# Patient Record
Sex: Female | Born: 1960
Health system: Southern US, Community
[De-identification: ages and names within clinical notes are randomized; demographics above are authoritative.]

## PROBLEM LIST (undated history)

## (undated) DIAGNOSIS — Z973 Presence of spectacles and contact lenses: Secondary | ICD-10-CM

## (undated) DIAGNOSIS — J189 Pneumonia, unspecified organism: Secondary | ICD-10-CM

## (undated) DIAGNOSIS — E785 Hyperlipidemia, unspecified: Secondary | ICD-10-CM

## (undated) DIAGNOSIS — I10 Essential (primary) hypertension: Secondary | ICD-10-CM

## (undated) DIAGNOSIS — R768 Other specified abnormal immunological findings in serum: Secondary | ICD-10-CM

## (undated) DIAGNOSIS — K219 Gastro-esophageal reflux disease without esophagitis: Secondary | ICD-10-CM

## (undated) DIAGNOSIS — M069 Rheumatoid arthritis, unspecified: Secondary | ICD-10-CM

## (undated) DIAGNOSIS — E669 Obesity, unspecified: Secondary | ICD-10-CM

## (undated) DIAGNOSIS — M1990 Unspecified osteoarthritis, unspecified site: Secondary | ICD-10-CM

## (undated) DIAGNOSIS — T7840XA Allergy, unspecified, initial encounter: Secondary | ICD-10-CM

## (undated) DIAGNOSIS — I493 Ventricular premature depolarization: Secondary | ICD-10-CM

## (undated) DIAGNOSIS — Z8632 Personal history of gestational diabetes: Secondary | ICD-10-CM

## (undated) DIAGNOSIS — K602 Anal fissure, unspecified: Secondary | ICD-10-CM

## (undated) HISTORY — PX: COLONOSCOPY: SHX174

## (undated) HISTORY — DX: Other specified abnormal immunological findings in serum: R76.8

## (undated) HISTORY — PX: ABDOMINAL HYSTERECTOMY: SHX81

## (undated) HISTORY — DX: Unspecified osteoarthritis, unspecified site: M19.90

## (undated) HISTORY — DX: Obesity, unspecified: E66.9

## (undated) HISTORY — DX: Anal fissure, unspecified: K60.2

## (undated) HISTORY — PX: PLANTAR'S WART EXCISION: SHX2240

## (undated) HISTORY — DX: Gastro-esophageal reflux disease without esophagitis: K21.9

## (undated) HISTORY — DX: Essential (primary) hypertension: I10

## (undated) HISTORY — DX: Hyperlipidemia, unspecified: E78.5

## (undated) HISTORY — DX: Ventricular premature depolarization: I49.3

## (undated) HISTORY — DX: Rheumatoid arthritis, unspecified: M06.9

## (undated) HISTORY — DX: Allergy, unspecified, initial encounter: T78.40XA

---
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
## 1998-02-06 ENCOUNTER — Inpatient Hospital Stay (HOSPITAL_COMMUNITY): Admission: AD | Admit: 1998-02-06 | Discharge: 1998-02-07 | Payer: Self-pay | Admitting: Obstetrics and Gynecology

## 1998-02-07 ENCOUNTER — Inpatient Hospital Stay (HOSPITAL_COMMUNITY): Admission: AD | Admit: 1998-02-07 | Discharge: 1998-02-10 | Payer: Self-pay | Admitting: Obstetrics and Gynecology

## 1999-03-28 ENCOUNTER — Other Ambulatory Visit: Admission: RE | Admit: 1999-03-28 | Discharge: 1999-03-28 | Payer: Self-pay | Admitting: Obstetrics and Gynecology

## 1999-06-21 ENCOUNTER — Encounter: Admission: RE | Admit: 1999-06-21 | Discharge: 1999-09-19 | Payer: Self-pay | Admitting: Gynecology

## 1999-12-29 ENCOUNTER — Inpatient Hospital Stay (HOSPITAL_COMMUNITY): Admission: AD | Admit: 1999-12-29 | Discharge: 2000-01-01 | Payer: Self-pay | Admitting: Gynecology

## 2000-02-09 ENCOUNTER — Other Ambulatory Visit: Admission: RE | Admit: 2000-02-09 | Discharge: 2000-02-09 | Payer: Self-pay | Admitting: Gynecology

## 2001-02-14 ENCOUNTER — Other Ambulatory Visit: Admission: RE | Admit: 2001-02-14 | Discharge: 2001-02-14 | Payer: Self-pay | Admitting: Obstetrics and Gynecology

## 2001-12-22 ENCOUNTER — Encounter: Payer: Self-pay | Admitting: Obstetrics and Gynecology

## 2001-12-22 ENCOUNTER — Ambulatory Visit (HOSPITAL_COMMUNITY): Admission: RE | Admit: 2001-12-22 | Discharge: 2001-12-22 | Payer: Self-pay | Admitting: Obstetrics and Gynecology

## 2002-01-28 ENCOUNTER — Other Ambulatory Visit: Admission: RE | Admit: 2002-01-28 | Discharge: 2002-01-28 | Payer: Self-pay | Admitting: Obstetrics and Gynecology

## 2003-02-05 ENCOUNTER — Other Ambulatory Visit: Admission: RE | Admit: 2003-02-05 | Discharge: 2003-02-05 | Payer: Self-pay | Admitting: Obstetrics and Gynecology

## 2003-09-06 ENCOUNTER — Encounter: Payer: Self-pay | Admitting: Obstetrics and Gynecology

## 2003-09-06 ENCOUNTER — Ambulatory Visit (HOSPITAL_COMMUNITY): Admission: RE | Admit: 2003-09-06 | Discharge: 2003-09-06 | Payer: Self-pay | Admitting: Obstetrics and Gynecology

## 2003-09-30 ENCOUNTER — Ambulatory Visit (HOSPITAL_COMMUNITY): Admission: RE | Admit: 2003-09-30 | Discharge: 2003-09-30 | Payer: Self-pay | Admitting: Internal Medicine

## 2003-09-30 ENCOUNTER — Encounter: Payer: Self-pay | Admitting: Internal Medicine

## 2004-02-25 ENCOUNTER — Other Ambulatory Visit: Admission: RE | Admit: 2004-02-25 | Discharge: 2004-02-25 | Payer: Self-pay | Admitting: Obstetrics and Gynecology

## 2004-09-11 ENCOUNTER — Ambulatory Visit (HOSPITAL_COMMUNITY): Admission: RE | Admit: 2004-09-11 | Discharge: 2004-09-11 | Payer: Self-pay | Admitting: Obstetrics and Gynecology

## 2004-12-24 DIAGNOSIS — R768 Other specified abnormal immunological findings in serum: Secondary | ICD-10-CM

## 2004-12-24 HISTORY — DX: Other specified abnormal immunological findings in serum: R76.8

## 2005-03-14 ENCOUNTER — Other Ambulatory Visit: Admission: RE | Admit: 2005-03-14 | Discharge: 2005-03-14 | Payer: Self-pay | Admitting: Obstetrics and Gynecology

## 2005-09-14 ENCOUNTER — Ambulatory Visit (HOSPITAL_COMMUNITY): Admission: RE | Admit: 2005-09-14 | Discharge: 2005-09-14 | Payer: Self-pay | Admitting: Obstetrics and Gynecology

## 2006-03-19 ENCOUNTER — Other Ambulatory Visit: Admission: RE | Admit: 2006-03-19 | Discharge: 2006-03-19 | Payer: Self-pay | Admitting: Obstetrics & Gynecology

## 2006-09-16 ENCOUNTER — Ambulatory Visit (HOSPITAL_COMMUNITY): Admission: RE | Admit: 2006-09-16 | Discharge: 2006-09-16 | Payer: Self-pay | Admitting: Obstetrics and Gynecology

## 2007-04-18 ENCOUNTER — Other Ambulatory Visit: Admission: RE | Admit: 2007-04-18 | Discharge: 2007-04-18 | Payer: Self-pay | Admitting: Obstetrics and Gynecology

## 2007-09-29 ENCOUNTER — Ambulatory Visit (HOSPITAL_COMMUNITY): Admission: RE | Admit: 2007-09-29 | Discharge: 2007-09-29 | Payer: Self-pay | Admitting: Obstetrics and Gynecology

## 2007-10-03 ENCOUNTER — Encounter: Admission: RE | Admit: 2007-10-03 | Discharge: 2007-10-03 | Payer: Self-pay | Admitting: Obstetrics and Gynecology

## 2008-01-21 ENCOUNTER — Emergency Department (HOSPITAL_COMMUNITY): Admission: EM | Admit: 2008-01-21 | Discharge: 2008-01-21 | Payer: Self-pay | Admitting: Family Medicine

## 2008-02-24 ENCOUNTER — Encounter: Admission: RE | Admit: 2008-02-24 | Discharge: 2008-03-09 | Payer: Self-pay | Admitting: Internal Medicine

## 2008-04-23 ENCOUNTER — Other Ambulatory Visit: Admission: RE | Admit: 2008-04-23 | Discharge: 2008-04-23 | Payer: Self-pay | Admitting: Obstetrics and Gynecology

## 2008-11-01 ENCOUNTER — Ambulatory Visit (HOSPITAL_COMMUNITY): Admission: RE | Admit: 2008-11-01 | Discharge: 2008-11-01 | Payer: Self-pay | Admitting: Obstetrics and Gynecology

## 2009-06-29 ENCOUNTER — Ambulatory Visit: Payer: Self-pay | Admitting: Obstetrics & Gynecology

## 2009-06-29 ENCOUNTER — Encounter: Payer: Self-pay | Admitting: Obstetrics & Gynecology

## 2009-06-29 LAB — CONVERTED CEMR LAB
AST: 17 units/L (ref 0–37)
Albumin: 4.2 g/dL (ref 3.5–5.2)
CO2: 23 meq/L (ref 19–32)
Calcium: 9.4 mg/dL (ref 8.4–10.5)
Chloride: 104 meq/L (ref 96–112)
Cholesterol: 186 mg/dL (ref 0–200)
HDL: 49 mg/dL (ref >39)
LDL Cholesterol: 112 mg/dL — ABNORMAL HIGH (ref 0–99)
MCV: 88.5 fL (ref 78.0–100.0)
Platelets: 251 10*3/uL (ref 150–400)
RBC: 4.8 M/uL (ref 3.87–5.11)
Sodium: 141 meq/L (ref 135–145)
TSH: 2.355 microintl units/mL (ref 0.350–4.500)
Total Bilirubin: 0.4 mg/dL (ref 0.3–1.2)
Triglycerides: 126 mg/dL (ref <150)

## 2009-11-21 ENCOUNTER — Ambulatory Visit (HOSPITAL_COMMUNITY): Admission: RE | Admit: 2009-11-21 | Discharge: 2009-11-21 | Payer: Self-pay | Admitting: Obstetrics and Gynecology

## 2010-10-13 ENCOUNTER — Other Ambulatory Visit: Admission: RE | Admit: 2010-10-13 | Discharge: 2010-10-13 | Payer: Self-pay | Admitting: Family Medicine

## 2010-11-28 ENCOUNTER — Encounter: Admission: RE | Admit: 2010-11-28 | Discharge: 2010-11-28 | Payer: Self-pay | Admitting: Family Medicine

## 2010-12-08 ENCOUNTER — Ambulatory Visit (HOSPITAL_COMMUNITY)
Admission: RE | Admit: 2010-12-08 | Discharge: 2010-12-08 | Payer: Self-pay | Source: Home / Self Care | Attending: Family Medicine | Admitting: Family Medicine

## 2011-01-14 ENCOUNTER — Encounter: Payer: Self-pay | Admitting: Obstetrics and Gynecology

## 2011-01-25 ENCOUNTER — Other Ambulatory Visit: Payer: Self-pay

## 2011-01-25 ENCOUNTER — Other Ambulatory Visit: Payer: Self-pay | Admitting: Pediatrics

## 2011-01-25 DIAGNOSIS — R55 Syncope and collapse: Secondary | ICD-10-CM

## 2011-05-11 NOTE — Op Note (Signed)
[facility]  Patient:    [name]                       MRN: [id number] Proc. Date: 12/29/99 Adm. Date:  [id number] Attending:  Merrily Pew                           Operative Report  PREOPERATIVE DIAGNOSIS:       Pregnancy at term.  History of prior cesarean section, desires repeat cesarean section.  Suspected macrosomia.  POSTOPERATIVE DIAGNOSIS:      Pregnancy at term.  History of prior cesarean section, desires repeat cesarean section.  Suspected macrosomia.  Leiomyomata.  OPERATION:                    Repeat low transverse cesarean section.  SURGEON:                      Timothy P. Fontaine, M.D.  ASSISTANTGaetano Hawthorne. Lily Peer, M.D.  ANESTHESIA:                   Spinal.  COMPLICATIONS:                None.  SPECIMEN:                     Samples of cord blood.  FINDINGS:                     At 10:56 a.m. normal female infant, weight 9 pounds 6 ounces, Apgars 8 and 9.  Posterior uterine subserosal leiomyomata approximately 2 to 3 cm.  Pelvic anatomy otherwise noted to be normal.  ESTIMATED BLOOD LOSS:         Less than 500 cc.  DESCRIPTION OF PROCEDURE:     The patient was taken to the operating room and underwent spinal anesthesia, was placed in the left tilt supine position, achieved an abdominal preparation with Betadine solution and a Foley catheter was placed in sterile technique.  The patient was then draped in the usual fashion.  Adequate  anesthesia was assured and the abdomen was sharply entered through a repeat Pfannenstiel incision achieving adequate hemostasis through all levels.  The bladder flap was then sharply and bluntly developed without difficulty.  The uterus was sharply entered in the lower uterine segment and bluntly laterally.  The membranes were ruptured, the fluid noted to be clear.  The infant delivered through this incision with the assistance of the vacuum extractor.   The nares and mouth  suctioned and the rest of the infant was delivered.  The cord doubly clamped and cut and the infant handed to the pediatrics in attendance.  Samples of cord blood were taken.  The placenta was then spontaneously extracted.  The uterus was exteriorized and the endometrial cavity explored with a sponge to remove all placental and membrane fragments.  The uterine incision was then closed in one layer using 0 Vicryl suture in a running interlocking stitch.  One small  bleeding area was addressed with interrupted 0 Vicryl suture.  The uterus was returned to the abdomen which was copiously irrigated and adequate hemostasis visualized.  The anterior fascia was then reapproximated using 0 Vicryl suture n a running stitch starting at  the angle and meeting in the middle.  The subcutaneous tissues were copiously irrigated and hemostasis was achieved with electrocautery. The skin was reapproximated with staples with intervening Steri-Strips after Benzoin application.  A sterile dressing was applied.  The patient was taken to the recovery room in good condition having tolerated the procedure well. DD:  12/29/99 TD:  12/30/99 Job: [id number]

## 2011-05-11 NOTE — Discharge Summary (Signed)
[facility]  Patient:    [name]                       MRN: [id number] Adm. Date:  [id number] Disc. Date: [id number] Attending:  Merrily Pew Dictator:   Gwendolyn Fill. Gatewood, P.A.                           Discharge Summary  DISCHARGE DIAGNOSES:          1. Intrauterine pregnancy at 39+ weeks, delivered.                               2. History of prior cesarean section.                               3. Suspected fetal macrosomia.                               4. Desired repeat cesarean section.                               5. Leiomyomata.                               6. Status post repeat low transverse cesarean section                                  by Timothy P. Fontaine, M.D. on December 29, 1999.  HISTORY OF PRESENT ILLNESS:   This 50 year old female, gravida 2, para 1, with n EDC of January 03, 2000.  Prenatal course has been complicated by history of prior cesarean section and also had history of PIH her previous pregnancy.  She did have an abnormal AFP and she declined amniocentesis initially until she was found to  have polyhydramnios at 23 weeks of gestation.  Revealed normal chromosomes on amniocentesis.  HOSPITAL COURSE:              On December 29, 1999, the patient was admitted at 39+ weeks with suspected fetal macrosomia and a history of prior cesarean section, desired repeat cesarean section.  On December 29, 1999, the patient underwent a repeat low transverse cesarean section by Nadyne Coombes. Fontaine, M.D. and at 1056  underwent delivery of a female, Apgars of 8 and 9, weight of 9 pounds 6 ounces. It was noted that there were posterior uterine subserosal leioma approximately 2 to 3 cm.  Postpartum, the patient remained afebrile, voiding, and in stable condition. She was discharged to home on January 01, 2000, and given [facility] Gynecology postpartum instructions as well as postpartum booklet.  ACCESSORY CLINICAL  DATA:      The patient is A positive, rubella immune.  On December 30, 1999, hemoglobin was 10.4, hematocrit 30.7.  DISPOSITION:                  The patient was discharged to home and informed to return to the office in six weeks and if she has any problems prior to that time to be seen in the office.  She was  given a prescription for Tylox p.r.n. pain.DD: 01/30/00 TD:  01/30/00 Job: [id number]

## 2011-11-20 ENCOUNTER — Other Ambulatory Visit (HOSPITAL_COMMUNITY)
Admission: RE | Admit: 2011-11-20 | Discharge: 2011-11-20 | Disposition: A | Discharge status: Home / Self Care | Payer: 59 | Source: Ambulatory Visit | Attending: Family Medicine | Admitting: Family Medicine

## 2011-11-20 ENCOUNTER — Other Ambulatory Visit: Payer: Self-pay | Admitting: Family Medicine

## 2011-11-20 DIAGNOSIS — Z124 Encounter for screening for malignant neoplasm of cervix: Secondary | ICD-10-CM | POA: Insufficient documentation

## 2011-12-06 ENCOUNTER — Other Ambulatory Visit (HOSPITAL_COMMUNITY): Payer: Self-pay | Admitting: Family Medicine

## 2011-12-06 DIAGNOSIS — Z1231 Encounter for screening mammogram for malignant neoplasm of breast: Secondary | ICD-10-CM

## 2012-01-01 ENCOUNTER — Encounter: Payer: Self-pay | Admitting: Internal Medicine

## 2012-01-10 ENCOUNTER — Ambulatory Visit (HOSPITAL_COMMUNITY)
Admission: RE | Admit: 2012-01-10 | Discharge: 2012-01-10 | Disposition: A | Discharge status: Home / Self Care | Payer: 59 | Source: Ambulatory Visit | Attending: Family Medicine | Admitting: Family Medicine

## 2012-01-10 DIAGNOSIS — Z1231 Encounter for screening mammogram for malignant neoplasm of breast: Secondary | ICD-10-CM | POA: Insufficient documentation

## 2012-01-22 ENCOUNTER — Encounter: Payer: Self-pay | Admitting: Internal Medicine

## 2012-01-22 ENCOUNTER — Ambulatory Visit (INDEPENDENT_AMBULATORY_CARE_PROVIDER_SITE_OTHER): Payer: 59 | Admitting: Internal Medicine

## 2012-01-22 VITALS — BP 134/78 | HR 64 | Ht 66.0 in | Wt 245.0 lb

## 2012-01-22 DIAGNOSIS — I10 Essential (primary) hypertension: Secondary | ICD-10-CM

## 2012-01-22 DIAGNOSIS — M069 Rheumatoid arthritis, unspecified: Secondary | ICD-10-CM | POA: Insufficient documentation

## 2012-01-22 DIAGNOSIS — K625 Hemorrhage of anus and rectum: Secondary | ICD-10-CM

## 2012-01-22 MED ORDER — PEG-KCL-NACL-NASULF-NA ASC-C 100 G PO SOLR: 1.0000 | Freq: Once | ORAL | Status: DC

## 2012-01-22 MED ORDER — HYDROCORTISONE ACETATE 25 MG RE SUPP: 25.0000 mg | Freq: Every day | RECTAL | Status: DC

## 2012-01-22 NOTE — Progress Notes (Addendum)
Subjective:    Patient ID: [name], female    DOB: 22-Sep-1961, 51 y.o.   MRN: [id number]  HPI This pleasant lady is describing some bright red blood with bowel movement (on) and with wiping. Also having itching in the anal area at times. ? Slight protrusion. She tried OTC agents (Preparation H and others. This did not help so she asked PCP for Proctofoam and that helped but now that she completed treatment she notices increasing rectal discomfort. Bleeding has been occurring over past few months. Has been on Meloxicam x 1 year (new dx of RA in past year). No other obvious triggers. Stools are formed and soft. Never has hard stools and is not straining. Does have an increasing frequency of stools at times. No Known Allergies  Current outpatient prescriptions:acetaminophen (TYLENOL) 325 MG tablet, Take 650 mg by mouth as needed., Disp: , Rfl: ;  calcium carbonate (TUMS - DOSED IN MG ELEMENTAL CALCIUM) 500 MG chewable tablet, Chew 1 tablet by mouth as needed., Disp: , Rfl: ;  Cholecalciferol (VITAMIN D) 2000 UNITS CAPS, Take 1 capsule by mouth daily., Disp: , Rfl:  esomeprazole (NEXIUM) 40 MG capsule, Take 40 mg by mouth as directed. Takes three times a week, Disp: , Rfl: ;  folic acid (FOLVITE) 1 MG tablet, Take 1 mg by mouth daily., Disp: , Rfl: ;  hydrochlorothiazide (HYDRODIURIL) 25 MG tablet, Take 25 mg by mouth daily., Disp: , Rfl: ;  meloxicam (MOBIC) 15 MG tablet, Take 15 mg by mouth. 1/2 tablet by mouth every other day, Disp: , Rfl:  Multiple Vitamin (MULTIVITAMIN) tablet, Take 1 tablet by mouth daily., Disp: , Rfl: ;  Pramox-PE-Glycerin-Petrolatum (PREPARATION H RE), Place rectally as needed., Disp: , Rfl: ;  shark liver oil-cocoa butter (PREPARATION H) 0.25-3-85.5 % suppository, Place 1 suppository rectally as needed., Disp: , Rfl:      Past Medical History  Diagnosis Date  . Arthritis   . Hypertension   . Asthma     as a child   . Hemorrhoids   . GERD (gastroesophageal reflux  disease)   . Helicobacter pylori antibody positive 2006     Tx w/ PPI, amoxicillin and Biaxin  . PVC's (premature ventricular contractions)    Past Surgical History  Procedure Date  . Cesarean section     x 2  . Plantar's wart excision    History   Social History  . Marital Status: Married    Spouse Name: N/A    Number of Children: 2  . Years of Education: N/A   Occupational History  . RN     Social History Main Topics  . Smoking status: Never Smoker   . Smokeless tobacco: Never Used  . Alcohol Use: No  . Drug Use: No  . Sexually Active: None   Other Topics Concern  . 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 Colon cancer Neg Hx   . Diabetes Brother     x 2         Review of Systems + joint pains, allergies, insomnia and hot flashes All other ROS negative    Objective:   Physical Exam General:  Well-developed, well-nourished and in no acute distress - obese Eyes:  anicteric. ENT:   Mouth and posterior pharynx free  of lesions.  Neck:   supple w/o thyromegaly or mass.  Lungs: Clear to auscultation bilaterally. Heart:  S1S2, no rubs, murmurs, gallops. Abdomen:  soft, non-tender, no hepatosplenomegaly, hernia, or mass and BS+.  Rectal:  female staff present - normal anoderm, mildly tender DRE, brown stool, no mass Lymph:  no cervical or supraclavicular adenopathy. Extremities:   no edema Neuro:  A&O x 3.  Psych:  appropriate mood and  Affect.   01/18/12 Progress note and lab review from rheumatologist. CBC was normal. Comprehensive metabolic panel normal. Sedimentation rate was 40.    Assessment & Plan:   1. Rectal bleeding    this sounds like it is probably from hemorrhoids. Have not proven that yet. Given her age, comorbidities and the symptoms and signs we have decided  to pursue a full colonoscopy. In the meantime she will use Anusol-HC suppositories for relief of her anorectal symptoms. Risks benefits and indications of colonoscopy explained she understands and agrees to proceed.

## 2012-01-22 NOTE — Patient Instructions (Addendum)
You have been scheduled for a Colonoscopy with separate instructions given. Your prep kit has been sent to your pharmacy for you to pick up. Your prescription(s) has(have) been sent to your pharmacy for you to pick up (Hydrocortisone Supp).

## 2012-02-15 ENCOUNTER — Other Ambulatory Visit: Payer: 59 | Admitting: Internal Medicine

## 2012-03-05 ENCOUNTER — Telehealth: Payer: Self-pay | Admitting: Internal Medicine

## 2012-03-05 NOTE — Telephone Encounter (Signed)
Returned pt's call.  Was work number/pt has left for the day.  Will try again in am.

## 2012-03-06 NOTE — Telephone Encounter (Signed)
Pt called to inquire about whether or not she should reschedule her colonoscopy if she is menstruating. Informed that for [place] she does not need to reschedule, to alert staff if she is menstruating the day of her procedure/TE

## 2012-03-10 ENCOUNTER — Telehealth: Payer: Self-pay | Admitting: Internal Medicine

## 2012-03-10 ENCOUNTER — Encounter: Payer: Self-pay | Admitting: Internal Medicine

## 2012-03-10 ENCOUNTER — Ambulatory Visit (AMBULATORY_SURGERY_CENTER): Payer: 59 | Admitting: Internal Medicine

## 2012-03-10 VITALS — BP 140/95 | HR 94 | Temp 97.9°F | Resp 18 | Ht 66.0 in | Wt 245.0 lb

## 2012-03-10 DIAGNOSIS — K625 Hemorrhage of anus and rectum: Secondary | ICD-10-CM

## 2012-03-10 DIAGNOSIS — K649 Unspecified hemorrhoids: Secondary | ICD-10-CM

## 2012-03-10 MED ORDER — SODIUM CHLORIDE 0.9 % IV SOLN: 500.0000 mL | INTRAVENOUS | Status: DC

## 2012-03-10 NOTE — Op Note (Addendum)
[facility] [address] [place], [place]  [address]  COLONOSCOPY PROCEDURE REPORT  PATIENT:  Beverly, Cherry  MR#:  [id number] BIRTHDATE:  [birth year], 50 yrs. old  GENDER:  female ENDOSCOPIST:  Iva Boop, MD, [facility]  PROCEDURE DATE:  03/10/2012 PROCEDURE:  Colonoscopy 27253 ASA CLASS:  Class II INDICATIONS:  rectal bleeding MEDICATIONS:   These medications were titrated to patient response per physician's verbal order, Fentanyl 75 mcg IV, Versed 10 mg IV  DESCRIPTION OF PROCEDURE:   After the risks benefits and alternatives of the procedure were thoroughly explained, informed consent was obtained.  Digital rectal exam was performed and revealed no abnormalities.   The LB [id number]-[id number] endoscope was introduced through the anus and advanced to the cecum, which was identified by both the appendix and ileocecal valve, without limitations.  The quality of the prep was excellent, using MoviPrep.  The instrument was then slowly withdrawn as the colon was fully examined. <<PROCEDUREIMAGES>>  FINDINGS:  A normal appearing cecum, ileocecal valve, and appendiceal orifice were identified. The ascending, hepatic flexure, transverse, splenic flexure, descending, sigmoid colon, and rectum appeared unremarkable.   Retroflexed views in the rectum revealed internal and external hemorrhoids.  Small.  The time to cecum = 3:31 minutes. The scope was then withdrawn in 8:35 minutes from the cecum and the procedure completed. COMPLICATIONS:  None ENDOSCOPIC IMPRESSION: 1) Normal colon 2) Internal and external hemorrhoids (small) - cause of bleeding  RECOMMENDATIONS: She has responded to hydrocortisone suppositories REPEAT EXAM:  In 10 year(s) for routine screening colonoscopy.  Iva Boop, MD, [facility]  CC:  The Patient and Carolin Coy, MD  n. REVISED:  03/10/2012 04:20 PM eSIGNED:   Iva Boop at 03/10/2012 04:20 PM  Raynald Blend, [phone]

## 2012-03-10 NOTE — Patient Instructions (Signed)
YOU HAD AN ENDOSCOPIC PROCEDURE TODAY AT THE [facility]: Refer to the procedure report that was given to you for any specific questions about what was found during the examination.  If the procedure report does not answer your questions, please call your gastroenterologist to clarify.  If you requested that your care partner not be given the details of your procedure findings, then the procedure report has been included in a sealed envelope for you to review at your convenience later.  YOU SHOULD EXPECT: Some feelings of bloating in the abdomen. Passage of more gas than usual.  Walking can help get rid of the air that was put into your GI tract during the procedure and reduce the bloating. If you had a lower endoscopy (such as a colonoscopy or flexible sigmoidoscopy) you may notice spotting of blood in your stool or on the toilet paper. If you underwent a bowel prep for your procedure, then you may not have a normal bowel movement for a few days.  DIET: Your first meal following the procedure should be a light meal and then it is ok to progress to your normal diet.  A half-sandwich or bowl of soup is an example of a good first meal.  Heavy or fried foods are harder to digest and may make you feel nauseous or bloated.  Likewise meals heavy in dairy and vegetables can cause extra gas to form and this can also increase the bloating.  Drink plenty of fluids but you should avoid alcoholic beverages for 24 hours.  ACTIVITY: Your care partner should take you home directly after the procedure.  You should plan to take it easy, moving slowly for the rest of the day.  You can resume normal activity the day after the procedure however you should NOT DRIVE or use heavy machinery for 24 hours (because of the sedation medicines used during the test).    SYMPTOMS TO REPORT IMMEDIATELY: A gastroenterologist can be reached at any hour.  During normal business hours, 8:30 AM to 5:00 PM Monday through Friday,  call [phone].  After hours and on weekends, please call the GI answering service at [phone] who will take a message and have the physician on call contact you.   Following lower endoscopy (colonoscopy or flexible sigmoidoscopy):  Excessive amounts of blood in the stool  Significant tenderness or worsening of abdominal pains  Swelling of the abdomen that is new, acute  Fever of 100F or higher    FOLLOW UP: If any biopsies were taken you will be contacted by phone or by letter within the next 1-3 weeks.  Call your gastroenterologist if you have not heard about the biopsies in 3 weeks.  Our staff will call the home number listed on your records the next business day following your procedure to check on you and address any questions or concerns that you may have at that time regarding the information given to you following your procedure. This is a courtesy call and so if there is no answer at the home number and we have not heard from you through the emergency physician on call, we will assume that you have returned to your regular daily activities without incident.  SIGNATURES/CONFIDENTIALITY: You and/or your care partner have signed paperwork which will be entered into your electronic medical record.  These signatures attest to the fact that that the information above on your After Visit Summary has been reviewed and is understood.  Full responsibility of the confidentiality   of this discharge information lies with you and/or your care-partner.     

## 2012-03-10 NOTE — Telephone Encounter (Signed)
Patient has had 1/2 liter moviprep this morning and now is vomiting. Had all moviprep liter last night. She states she passing yellow liquid no formed stool. Instructed patient to come in as directed.

## 2012-03-11 ENCOUNTER — Telehealth: Payer: Self-pay

## 2012-03-11 NOTE — Telephone Encounter (Signed)
  Follow up Call-  Call back number 03/10/2012  Post procedure Call Back phone  # [phone]  Permission to leave phone message Yes     Patient questions:  Do you have a fever, pain , or abdominal swelling? no Pain Score  0 *  Have you tolerated food without any problems? yes  Have you been able to return to your normal activities? yes  Do you have any questions about your discharge instructions: Diet   no Medications  no Follow up visit  no  Do you have questions or concerns about your Care? no  Actions: * If pain score is 4 or above: No action needed, pain <4.

## 2012-10-31 ENCOUNTER — Other Ambulatory Visit (HOSPITAL_COMMUNITY)
Admission: RE | Admit: 2012-10-31 | Discharge: 2012-10-31 | Disposition: A | Discharge status: Home / Self Care | Payer: 59 | Source: Ambulatory Visit | Attending: Family Medicine | Admitting: Family Medicine

## 2012-10-31 ENCOUNTER — Other Ambulatory Visit: Payer: Self-pay | Admitting: Family Medicine

## 2012-10-31 DIAGNOSIS — Z1151 Encounter for screening for human papillomavirus (HPV): Secondary | ICD-10-CM | POA: Insufficient documentation

## 2012-10-31 DIAGNOSIS — Z124 Encounter for screening for malignant neoplasm of cervix: Secondary | ICD-10-CM | POA: Insufficient documentation

## 2012-12-11 ENCOUNTER — Other Ambulatory Visit: Payer: Self-pay | Admitting: Obstetrics and Gynecology

## 2012-12-12 ENCOUNTER — Encounter (HOSPITAL_COMMUNITY): Payer: Self-pay | Admitting: Pharmacist

## 2012-12-18 ENCOUNTER — Encounter (HOSPITAL_COMMUNITY)
Admission: RE | Admit: 2012-12-18 | Discharge: 2012-12-18 | Disposition: A | Discharge status: Home / Self Care | Payer: 59 | Source: Ambulatory Visit | Attending: Obstetrics and Gynecology | Admitting: Obstetrics and Gynecology

## 2012-12-18 ENCOUNTER — Encounter (HOSPITAL_COMMUNITY): Payer: Self-pay

## 2012-12-18 ENCOUNTER — Other Ambulatory Visit: Payer: Self-pay

## 2012-12-18 LAB — BASIC METABOLIC PANEL
CO2: 30 mEq/L (ref 19–32)
Calcium: 9.3 mg/dL (ref 8.4–10.5)
Chloride: 103 mEq/L (ref 96–112)
Glucose, Bld: 95 mg/dL (ref 70–99)
Potassium: 4 mEq/L (ref 3.5–5.1)
Sodium: 139 mEq/L (ref 135–145)

## 2012-12-18 LAB — CBC
Hemoglobin: 12.8 g/dL (ref 12.0–15.0)
MCH: 29.4 pg (ref 26.0–34.0)
Platelets: 269 10*3/uL (ref 150–400)
RBC: 4.36 MIL/uL (ref 3.87–5.11)
WBC: 7.3 10*3/uL (ref 4.0–10.5)

## 2012-12-18 NOTE — Patient Instructions (Addendum)
20 DELLANIRA DILLOW  12/18/2012   Your procedure is scheduled on:  12/22/12  Enter through the Main Entrance of [facility] at 1 PM  Pick up the phone at the desk and dial [phone].   Call this number if you have problems the morning of surgery: [phone]   Remember:   Do not eat food:After Midnight.  Do not drink clear liquids: 4 Hours before arrival.  Take these medicines the morning of surgery with A SIP OF WATER: Blood pressure medication, GERD medication   Do not wear jewelry, make-up or nail polish.  Do not wear lotions, powders, or perfumes. You may wear deodorant.  Do not shave 48 hours prior to surgery.  Do not bring valuables to the hospital.  Contacts, dentures or bridgework may not be worn into surgery.  Leave suitcase in the car. After surgery it may be brought to your room.  For patients admitted to the hospital, checkout time is 11:00 AM the day of discharge.   Patients discharged the day of surgery will not be allowed to drive home.  Name and phone number of your driver: Husband  Special Instructions: Shower using CHG 2 nights before surgery and the night before surgery.  If you shower the day of surgery use CHG.  Use special wash - you have one bottle of CHG for all showers.  You should use approximately 1/3 of the bottle for each shower.   Please read over the following fact sheets that you were given: Surgical Site Infection Prevention

## 2012-12-22 ENCOUNTER — Ambulatory Visit (HOSPITAL_COMMUNITY): Payer: 59 | Admitting: Anesthesiology

## 2012-12-22 ENCOUNTER — Ambulatory Visit (HOSPITAL_COMMUNITY)
Admission: RE | Admit: 2012-12-22 | Discharge: 2012-12-22 | Disposition: A | Discharge status: Home / Self Care | Payer: 59 | Source: Ambulatory Visit | Attending: Obstetrics and Gynecology | Admitting: Obstetrics and Gynecology

## 2012-12-22 ENCOUNTER — Encounter (HOSPITAL_COMMUNITY): Payer: Self-pay | Admitting: Anesthesiology

## 2012-12-22 ENCOUNTER — Other Ambulatory Visit: Payer: Self-pay | Admitting: Obstetrics and Gynecology

## 2012-12-22 ENCOUNTER — Encounter (HOSPITAL_COMMUNITY)
Admission: RE | Disposition: A | Discharge status: Home / Self Care | Payer: Self-pay | Source: Ambulatory Visit | Attending: Obstetrics and Gynecology

## 2012-12-22 DIAGNOSIS — Z01812 Encounter for preprocedural laboratory examination: Secondary | ICD-10-CM | POA: Insufficient documentation

## 2012-12-22 DIAGNOSIS — N841 Polyp of cervix uteri: Secondary | ICD-10-CM | POA: Insufficient documentation

## 2012-12-22 DIAGNOSIS — Z8742 Personal history of other diseases of the female genital tract: Secondary | ICD-10-CM

## 2012-12-22 DIAGNOSIS — Z01818 Encounter for other preprocedural examination: Secondary | ICD-10-CM | POA: Insufficient documentation

## 2012-12-22 DIAGNOSIS — N920 Excessive and frequent menstruation with regular cycle: Secondary | ICD-10-CM | POA: Insufficient documentation

## 2012-12-22 HISTORY — PX: HYSTEROSCOPY WITH D & C: SHX1775

## 2012-12-22 HISTORY — PX: CERVICAL POLYPECTOMY: SHX88

## 2012-12-22 LAB — ABO/RH: ABO/RH(D): AB POS

## 2012-12-22 LAB — TYPE AND SCREEN
ABO/RH(D): AB POS
Antibody Screen: NEGATIVE

## 2012-12-22 SURGERY — DILATATION AND CURETTAGE /HYSTEROSCOPY
Anesthesia: General | Site: Uterus | Wound class: Clean Contaminated

## 2012-12-22 MED ORDER — LACTATED RINGERS IV SOLN
INTRAVENOUS | Status: DC
  Administered 2012-12-22 (×3): via INTRAVENOUS

## 2012-12-22 MED ORDER — LIDOCAINE HCL 2 % IJ SOLN
INTRAMUSCULAR | Status: AC
  Filled 2012-12-22: qty 20

## 2012-12-22 MED ORDER — LIDOCAINE HCL (CARDIAC) 20 MG/ML IV SOLN
INTRAVENOUS | Status: DC | PRN
  Administered 2012-12-22: 30 mg via INTRAVENOUS

## 2012-12-22 MED ORDER — DEXAMETHASONE SODIUM PHOSPHATE 4 MG/ML IJ SOLN
INTRAMUSCULAR | Status: DC | PRN
  Administered 2012-12-22: 10 mg via INTRAVENOUS

## 2012-12-22 MED ORDER — PROPOFOL 10 MG/ML IV EMUL
INTRAVENOUS | Status: DC | PRN
  Administered 2012-12-22: 200 mg via INTRAVENOUS

## 2012-12-22 MED ORDER — FENTANYL CITRATE 0.05 MG/ML IJ SOLN
INTRAMUSCULAR | Status: AC
  Filled 2012-12-22: qty 5

## 2012-12-22 MED ORDER — LIDOCAINE HCL 2 % IJ SOLN
INTRAMUSCULAR | Status: DC | PRN
  Administered 2012-12-22: 10 mL

## 2012-12-22 MED ORDER — GLYCOPYRROLATE 0.2 MG/ML IJ SOLN
INTRAMUSCULAR | Status: DC | PRN
  Administered 2012-12-22 (×2): 0.1 mg via INTRAVENOUS

## 2012-12-22 MED ORDER — FENTANYL CITRATE 0.05 MG/ML IJ SOLN
INTRAMUSCULAR | Status: DC | PRN
  Administered 2012-12-22 (×2): 100 ug via INTRAVENOUS
  Administered 2012-12-22: 50 ug via INTRAVENOUS

## 2012-12-22 MED ORDER — METOCLOPRAMIDE HCL 5 MG/ML IJ SOLN: 10.0000 mg | Freq: Once | INTRAMUSCULAR | Status: DC | PRN

## 2012-12-22 MED ORDER — GLYCINE 1.5 % IR SOLN
Status: DC | PRN
  Administered 2012-12-22: 3000 mL

## 2012-12-22 MED ORDER — MIDAZOLAM HCL 2 MG/2ML IJ SOLN
INTRAMUSCULAR | Status: AC
  Filled 2012-12-22: qty 2

## 2012-12-22 MED ORDER — SILVER NITRATE-POT NITRATE 75-25 % EX MISC
CUTANEOUS | Status: DC | PRN
  Administered 2012-12-22: 1 via TOPICAL

## 2012-12-22 MED ORDER — FENTANYL CITRATE 0.05 MG/ML IJ SOLN
INTRAMUSCULAR | Status: AC
  Administered 2012-12-22: 50 ug via INTRAVENOUS
  Filled 2012-12-22: qty 2

## 2012-12-22 MED ORDER — KETOROLAC TROMETHAMINE 30 MG/ML IJ SOLN
INTRAMUSCULAR | Status: DC | PRN
  Administered 2012-12-22: 30 mg via INTRAVENOUS

## 2012-12-22 MED ORDER — KETOROLAC TROMETHAMINE 30 MG/ML IJ SOLN
INTRAMUSCULAR | Status: AC
  Filled 2012-12-22: qty 1

## 2012-12-22 MED ORDER — CEFAZOLIN SODIUM-DEXTROSE 2-3 GM-% IV SOLR
INTRAVENOUS | Status: AC
  Filled 2012-12-22: qty 50

## 2012-12-22 MED ORDER — FENTANYL CITRATE 0.05 MG/ML IJ SOLN
25.0000 ug | INTRAMUSCULAR | Status: DC | PRN
  Administered 2012-12-22: 50 ug via INTRAVENOUS

## 2012-12-22 MED ORDER — MIDAZOLAM HCL 5 MG/5ML IJ SOLN
INTRAMUSCULAR | Status: DC | PRN
  Administered 2012-12-22: 2 mg via INTRAVENOUS

## 2012-12-22 MED ORDER — ONDANSETRON HCL 4 MG/2ML IJ SOLN
INTRAMUSCULAR | Status: DC | PRN
  Administered 2012-12-22: 4 mg via INTRAVENOUS

## 2012-12-22 MED ORDER — MEPERIDINE HCL 25 MG/ML IJ SOLN: 6.2500 mg | INTRAMUSCULAR | Status: DC | PRN

## 2012-12-22 MED ORDER — CEFAZOLIN SODIUM-DEXTROSE 2-3 GM-% IV SOLR
2.0000 g | INTRAVENOUS | Status: AC
  Administered 2012-12-22: 2 g via INTRAVENOUS

## 2012-12-22 MED ORDER — DEXAMETHASONE SODIUM PHOSPHATE 10 MG/ML IJ SOLN
INTRAMUSCULAR | Status: AC
  Filled 2012-12-22: qty 1

## 2012-12-22 SURGICAL SUPPLY — 16 items
CANISTER SUCTION 2500CC (MISCELLANEOUS) ×4 IMPLANT
CATH ROBINSON RED A/P 16FR (CATHETERS) ×4 IMPLANT
CLOTH BEACON ORANGE TIMEOUT ST (SAFETY) ×4 IMPLANT
CONTAINER PREFILL 10% NBF 60ML (FORM) ×8 IMPLANT
DILATOR CANAL MILEX (MISCELLANEOUS) IMPLANT
DRESSING TELFA 8X3 (GAUZE/BANDAGES/DRESSINGS) ×4 IMPLANT
GLOVE BIO SURGEON STRL SZ 6.5 (GLOVE) ×4 IMPLANT
GLOVE BIOGEL PI IND STRL 7.0 (GLOVE) ×6 IMPLANT
GLOVE BIOGEL PI INDICATOR 7.0 (GLOVE) ×2
GOWN STRL REIN XL XLG (GOWN DISPOSABLE) ×8 IMPLANT
LOOP ANGLED CUTTING 22FR (CUTTING LOOP) IMPLANT
PACK HYSTEROSCOPY LF (CUSTOM PROCEDURE TRAY) ×4 IMPLANT
PAD OB MATERNITY 4.3X12.25 (PERSONAL CARE ITEMS) ×4 IMPLANT
SUT VICRYL 0 ENDOLOOP (SUTURE) ×4 IMPLANT
TOWEL OR 17X24 6PK STRL BLUE (TOWEL DISPOSABLE) ×8 IMPLANT
WATER STERILE IRR 1000ML POUR (IV SOLUTION) ×4 IMPLANT

## 2012-12-22 NOTE — H&P (Signed)
12/10/2012  History of Present Illness  General:  51 y/o G2P2 presents for f/u ultrasound to assess a cervical polyp in preparation for surgery. Ultrasound done to confirm origin, i.e.- endometrial vs cervical. Pt also with increased spotting before and after menses that has increased length of menses. Ultrasound today shows a cervical polyp, 1.5 x 1.5 x 1.5 cm, + blood flow. EMS is 1.10 cm. Uterus o/w normal. Simple left ovarian cyst, 3.3 cm. Pt desires to proceed with cervical polypectomy and hyst/D&C. H/o PVCs~2 years ago. Cardiac w/u negative. Stress at work seemed to contribute. Has not had an episode in over a year. Saw PCP in November and exam was normal. ROS: normal BMs 2-3 times daily. Hemorrhoids from pregnancy. No family h/o bleeding disorders. Hemabate at time of c-section, prolonged labor.   Current Medications  Tums 500 MG Tablet Chewable 1 tablet as needed  Multivitamins Tablet 1 tablet everyday  Vitamin D 2000 UNIT Tablet 1 tablet with a meal Once a day  Tylenol 325 MG Tablet 2 tablet as needed  Clobetasol Propionate 0.05 % Cream 1 application to affected area Twice a day as needed  Folic Acid 1 MG Tablet TAKE 1 TABLET BY MOUTH ONCE DAILY   Zyrtec Allergy 10 MG Tablet 1 tablet Once a day  Methotrexate 2.5 MG Tablet 10 tabs once a week  Meloxicam 15 MG Tablet TAKE 1 TABLET BY MOUTH ONCE DAILY   Hydrochlorothiazide 25 MG Tablet take 1 tablet by mouth daily once a day  Nexium 40 MG Capsule Delayed Release TAKE 1 CAPSULE BY MOUTH ONCE DAILY AS DIRECTED   Medication List reviewed and reconciled with the patient   Past Medical History  Borderline blood pressure  Lipidemia  GERD  Mild anemia  Plantar fasciitis  vitamin D deficiency  Allergic rhinitis  history of PVC, holter in 2006 ok  Mild hearing loss, left ear hearing loss  Asthma as child no symptoms  parvo B19 viral polyarthralgia, 1997  Psoriasis  rheumatoid arthritis-low pos RF, CCP-, synovitis, non erosive,  also has psoriasis- Dr. Nickola Major   Surgical History  C-section x2 1999, 2001   Family History  Father: deceased 40 yrs Parkinson disease   Mother: alive 57 yrs RA, Osteoporosis   Brother 1: alive 46 yrs Diabetes, Hypertension   Brother2: alive 39 yrs Diabetes, Hypertension   Sister 1: alive 74 yrs Rheumatoid arthritis, Hypertension, depression, high cholesterol   Sister 2: alive 17 yrs rheumatoid arthritis, fibromyalgia,bipolar   Sister 3: alive 57 yrs rheumatoid Arthritis,   3 brother(s) , 5 sister(s) . 1 son(s) , 1 daughter(s) .   3 brothers and 5 sisters, other sister 71 yo high cholesterol, denies any GYN family cancer hx.   Social History  General:  History of smoking cigarettes: Never smoked.  no Smoking.  no Tobacco Exposure.  no Alcohol.  Caffeine: yes, coffee, 1cup/day.  no Recreational drug use.  no Diet.  Exercise: yes, nothing structured, walks.  Occupation: employed, Charity fundraiser, works for [employer].  Marital Status: married.  Children: Boys, 1, girls, 1.  Seat belt use: yes.    Gyn History  Sexual activity yes.  Periods : 15-40 days.  LMP 11/20/12.  Birth control vasectomy.  Last pap smear date 10/31/12, neg.  Last mammogram date 01/10/12.  Abnormal pap smear no.  Denies H/O STD .    OB History  Number of pregnancies 2.  Pregnancy # 1 live birth, C-section delivery.  Pregnancy # 2 live birth, C-section.  Allergies  N.K.D.A.   Hospitalization/Major Diagnostic Procedure  see surg hx    Vital Signs  Wt 227, Ht 67, BMI 35.55, Pulse sitting 83, BP sitting 142/80.   Physical Examination  GENERAL:  Patient appears in NAD, pleasant.  Build: well developed.  General Appearance: well-appearing, overweight.  Race: caucasian.  NECK:  ROM: normal.  Thyroid: no thyromegaly, non tender.  LUNGS:  Breath sounds: clear to auscultation.  Dyspnea: no.  HEART:  Murmurs: none.  Rate: normal.  Rhythm: regular.  ABDOMEN:  General: no  masses,tenderness,organomegaly, , BS normal, non distended.  EXTREMITIES:  Extremities no clubbing cyanosis or edema present.  NEUROLOGICAL:  gross motor and sensory grossly intact.  Orientation: alert and oriented x 3.  Previous pelvic exam on 12/05/13 with mobile 2-3 cm polypoid lesion at cervical os. Not friable, nontender. No abnormal discharge. Uterus normal size, nontender.   Assessments   1. Pre-op exam - V72.84 (Primary)   2. Endocervical polyp - 622.7   3. Prolonged menstruation - 626.2   Treatment  1. Pre-op exam  Proceed with endocervical polypectomy and hyst/d&C. Pt counseled on rationale of procedure, R/B/A, including risk of bleeding with subsequent hysterectomy if bleeding can not be controlled or hemorrhage. All questions answered. Consent obtained. Will need surgical clearance from PCP. Pre op appt with anesthesia due to h/o PVCs.    2. Endocervical polyp  Diagnostic Imaging:US ECHO TRANSVAGINAL    Procedure Codes  56213 ECHO TRANSVAGINAL  [id number] U S PELVIC   Follow Up  2 Weeks post op

## 2012-12-22 NOTE — Transfer of Care (Signed)
Immediate Anesthesia Transfer of Care Note  Patient: [name]  Procedure(s) Performed: Procedure(s) (LRB) with comments: DILATATION & CURETTAGE/HYSTEROSCOPY WITH RESECTOCOPE (N/A)  Patient Location: PACU  Anesthesia Type:General  Level of Consciousness: awake, alert  and oriented  Airway & Oxygen Therapy: Patient Spontanous Breathing and Patient connected to nasal cannula oxygen  Post-op Assessment: Report given to PACU RN and Post -op Vital signs reviewed and stable  Post vital signs: Reviewed and stable  Complications: No apparent anesthesia complications

## 2012-12-22 NOTE — H&P (View-Only) (Signed)
12/10/2012  History of Present Illness  General:  51 y/o G2P2 presents for f/u ultrasound to assess a cervical polyp in preparation for surgery. Ultrasound done to confirm origin, i.e.- endometrial vs cervical. Pt also with increased spotting before and after menses that has increased length of menses. Ultrasound today shows a cervical polyp, 1.5 x 1.5 x 1.5 cm, + blood flow. EMS is 1.10 cm. Uterus o/w normal. Simple left ovarian cyst, 3.3 cm. Pt desires to proceed with cervical polypectomy and hyst/D&C. H/o PVCs~2 years ago. Cardiac w/u negative. Stress at work seemed to contribute. Has not had an episode in over a year. Saw PCP in November and exam was normal. ROS: normal BMs 2-3 times daily. Hemorrhoids from pregnancy. No family h/o bleeding disorders. Hemabate at time of c-section, prolonged labor.   Current Medications  Tums 500 MG Tablet Chewable 1 tablet as needed  Multivitamins Tablet 1 tablet everyday  Vitamin D 2000 UNIT Tablet 1 tablet with a meal Once a day  Tylenol 325 MG Tablet 2 tablet as needed  Clobetasol Propionate 0.05 % Cream 1 application to affected area Twice a day as needed  Folic Acid 1 MG Tablet TAKE 1 TABLET BY MOUTH ONCE DAILY   Zyrtec Allergy 10 MG Tablet 1 tablet Once a day  Methotrexate 2.5 MG Tablet 10 tabs once a week  Meloxicam 15 MG Tablet TAKE 1 TABLET BY MOUTH ONCE DAILY   Hydrochlorothiazide 25 MG Tablet take 1 tablet by mouth daily once a day  Nexium 40 MG Capsule Delayed Release TAKE 1 CAPSULE BY MOUTH ONCE DAILY AS DIRECTED   Medication List reviewed and reconciled with the patient   Past Medical History  Borderline blood pressure  Lipidemia  GERD  Mild anemia  Plantar fasciitis  vitamin D deficiency  Allergic rhinitis  history of PVC, holter in 2006 ok  Mild hearing loss, left ear hearing loss  Asthma as child no symptoms  parvo B19 viral polyarthralgia, 1997  Psoriasis  rheumatoid arthritis-low pos RF, CCP-, synovitis, non erosive,  also has psoriasis- Dr. Hawkes   Surgical History  C-section x2 1999, 2001   Family History  Father: deceased 70 yrs Parkinson disease   Mother: alive 80 yrs RA, Osteoporosis   Brother 1: alive 46 yrs Diabetes, Hypertension   Brother2: alive 58 yrs Diabetes, Hypertension   Sister 1: alive 61 yrs Rheumatoid arthritis, Hypertension, depression, high cholesterol   Sister 2: alive 56 yrs rheumatoid arthritis, fibromyalgia,bipolar   Sister 3: alive 57 yrs rheumatoid Arthritis,   3 brother(s) , 5 sister(s) . 1 son(s) , 1 daughter(s) .   3 brothers and 5 sisters, other sister 49 yo high cholesterol, denies any GYN family cancer hx.   Social History  General:  History of smoking cigarettes: Never smoked.  no Smoking.  no Tobacco Exposure.  no Alcohol.  Caffeine: yes, coffee, 1cup/day.  no Recreational drug use.  no Diet.  Exercise: yes, nothing structured, walks.  Occupation: employed, RN, works for [employer].  Marital Status: married.  Children: Boys, 1, girls, 1.  Seat belt use: yes.    Gyn History  Sexual activity yes.  Periods : 15-40 days.  LMP 11/20/12.  Birth control vasectomy.  Last pap smear date 10/31/12, neg.  Last mammogram date 01/10/12.  Abnormal pap smear no.  Denies H/O STD .    OB History  Number of pregnancies 2.  Pregnancy # 1 live birth, C-section delivery.  Pregnancy # 2 live birth, C-section.      Allergies  N.K.D.A.   Hospitalization/Major Diagnostic Procedure  see surg hx    Vital Signs  Wt 227, Ht 67, BMI 35.55, Pulse sitting 83, BP sitting 142/80.   Physical Examination  GENERAL:  Patient appears in NAD, pleasant.  Build: well developed.  General Appearance: well-appearing, overweight.  Race: caucasian.  NECK:  ROM: normal.  Thyroid: no thyromegaly, non tender.  LUNGS:  Breath sounds: clear to auscultation.  Dyspnea: no.  HEART:  Murmurs: none.  Rate: normal.  Rhythm: regular.  ABDOMEN:  General: no  masses,tenderness,organomegaly, , BS normal, non distended.  EXTREMITIES:  Extremities no clubbing cyanosis or edema present.  NEUROLOGICAL:  gross motor and sensory grossly intact.  Orientation: alert and oriented x 3.  Previous pelvic exam on 12/05/13 with mobile 2-3 cm polypoid lesion at cervical os. Not friable, nontender. No abnormal discharge. Uterus normal size, nontender.   Assessments   1. Pre-op exam - V72.84 (Primary)   2. Endocervical polyp - 622.7   3. Prolonged menstruation - 626.2   Treatment  1. Pre-op exam  Proceed with endocervical polypectomy and hyst/d&C. Pt counseled on rationale of procedure, R/B/A, including risk of bleeding with subsequent hysterectomy if bleeding can not be controlled or hemorrhage. All questions answered. Consent obtained. Will need surgical clearance from PCP. Pre op appt with anesthesia due to h/o PVCs.    2. Endocervical polyp  Diagnostic Imaging:US ECHO TRANSVAGINAL    Procedure Codes  76830 ECHO TRANSVAGINAL  76856 U S PELVIC   Follow Up  2 Weeks post op    

## 2012-12-22 NOTE — Anesthesia Postprocedure Evaluation (Signed)
  Anesthesia Post-op Note  Patient: [name]  Procedure(s) Performed: Procedure(s) (LRB) with comments: DILATATION AND CURETTAGE /HYSTEROSCOPY () CERVICAL POLYPECTOMY (N/A) Patient is awake and responsive. Pain and nausea are reasonably well controlled. Vital signs are stable and clinically acceptable. Oxygen saturation is clinically acceptable. There are no apparent anesthetic complications at this time. Patient is ready for discharge.

## 2012-12-22 NOTE — Brief Op Note (Signed)
12/22/2012  3:55 PM  PATIENT:  [name]  51 y.o. female  PRE-OPERATIVE DIAGNOSIS:  Endocervical Polyp, Prolonged menses  POST-OPERATIVE DIAGNOSIS:  Same  PROCEDURE:  Procedure(s) (LRB) with comments: DILATATION & CURETTAGE/HYSTEROSCOPY WITH RESECTOCOPE (N/A)  SURGEON:  Surgeon(s) and Role:    * Geryl Rankins, MD - Primary  PHYSICIAN ASSISTANT:   ASSISTANTS: Technician   ANESTHESIA:   general  EBL:  Total I/O In: 1900 [I.V.:1900] Out: 20 [Blood:20]  BLOOD ADMINISTERED:none  DRAINS: none   LOCAL MEDICATIONS USED:  2% LIDOCAINE 10 cc  SPECIMEN:  Source of Specimen:  cervical polyp, endometrial currettings  DISPOSITION OF SPECIMEN:  PATHOLOGY  COUNTS:  YES  TOURNIQUET:  * No tourniquets in log *  DICTATION: .Other Dictation: Dictation Number [id number]  PLAN OF CARE: Discharge to home after PACU  PATIENT DISPOSITION:  PACU - hemodynamically stable.   Delay start of Pharmacological VTE agent (>24hrs) due to surgical blood loss or risk of bleeding: not applicable

## 2012-12-22 NOTE — Anesthesia Preprocedure Evaluation (Addendum)
Anesthesia Evaluation  Patient identified by MRN, date of birth, ID band Patient awake    Reviewed: Allergy & Precautions, H&P , NPO status , Patient's Chart, lab work & pertinent test results  Airway       Dental   Pulmonary asthma ,          Cardiovascular hypertension, Pt. on medications + dysrhythmias  PVC's    Neuro/Psych negative neurological ROS  negative psych ROS   GI/Hepatic Neg liver ROS, GERD-  Medicated and Controlled,  Endo/Other  negative endocrine ROS  Renal/GU negative Renal ROS  negative genitourinary   Musculoskeletal  (+) Arthritis -, Rheumatoid disorders,    Abdominal   Peds  Hematology negative hematology ROS (+)   Anesthesia Other Findings   Reproductive/Obstetrics Endocervical Polyp                          Anesthesia Physical Anesthesia Plan  ASA: II  Anesthesia Plan: General   Post-op Pain Management:    Induction: Intravenous  Airway Management Planned: LMA  Additional Equipment:   Intra-op Plan:   Post-operative Plan: Extubation in OR  Informed Consent: I have reviewed the patients History and Physical, chart, labs and discussed the procedure including the risks, benefits and alternatives for the proposed anesthesia with the patient or authorized representative who has indicated his/her understanding and acceptance.   Dental advisory given  Plan Discussed with:   Anesthesia Plan Comments:         Anesthesia Quick Evaluation

## 2012-12-22 NOTE — Interval H&P Note (Signed)
History and Physical Interval Note:  12/22/2012 2:21 PM  [name]  has presented today for surgery, with the diagnosis of Endocervical Polyp  The various methods of treatment have been discussed with the patient and family. After consideration of risks, benefits and other options for treatment, the patient has consented to  Procedure(s) (LRB) with comments: DILATATION & CURETTAGE/HYSTEROSCOPY WITH RESECTOCOPE (N/A) as a surgical intervention .  The patient's history has been reviewed, patient examined, no change in status, stable for surgery.  I have reviewed the patient's chart and labs.  Questions were answered to the patient's satisfaction.    Pt's LMP 12/16/12.  Normal flow then spotting.  [name], [name]

## 2012-12-23 NOTE — Op Note (Signed)
NAMEANTHONETTE, [name]               ACCOUNT NO.:  [id number]  MEDICAL RECORD NO.:  [id number]  LOCATION:  [facility]                          FACILITY:  [facility]  PHYSICIAN:  Pieter Partridge, MD   DATE OF BIRTH:  Nov 10, 1961  DATE OF PROCEDURE:  12/22/2012 DATE OF DISCHARGE:  12/22/2012                              OPERATIVE REPORT   PREOPERATIVE DIAGNOSIS:  Endocervical polyp and prolonged menses.  POSTOPERATIVE DIAGNOSIS:  Endocervical polyp and prolonged menses.  PROCEDURE:  Endocervical polypectomy and hysteroscopy D and C.  SURGEON:  Pieter Partridge, MD  ASSISTANT:  Technician.  EBL:  20 mL.  IV FLUIDS IN:  1900.  The patient voided prior to procedure.  ANESTHESIA:  Local 2% lidocaine, 10 mL.  SPECIMENS:  Cervical polyp and endometrial curettings to Pathology.  DISPOSITION:  To PACU hemodynamically stable.  COMPLICATIONS:  None.  FINDINGS:  A 2-cm cervical polyp with stalk high in the cervical canal. Normal uterus with atrophy near the fundus and more endometrium in the lower uterine segment.  Cervical canal appeared to have nabothian cyst as well.  INDICATION:  Initially presented upon referral from her primary doctor when an endocervical polyp was noted on a routine physical exam.  The patient denied post-coital bleeding; however, she had reported that she would have more spotting preceding her menses and afterwards, but it was continuous flow.  She denied intermenstrual bleeding.  Upon evaluation in the office, the polyp appeared to be large.  Ultrasound was done, and it did confirm that it was a cervical polyp, however, based on the size and need for exposure, the patient was counseled on resection in the operating room.  PROCEDURE IN DETAIL:  The patient was identified in the holding area. She was then taken to the operating room with IV running.  She underwent general endotracheal anesthesia without complications.  She was then placed in the dorsal lithotomy  position and prepped and draped in a normal sterile fashion.  The Graves speculum was then inserted into the vagina and the polyp was visualized.  For exposure, I changed to the weighted speculum and a narrow Deaver to be held by the assistant.  The cervical polyp was palpated and the stalk appeared to be maybe 2 cm into the endocervical canal.  The anterior lip of the cervix was initially grasped with an Allis, but then I changed to a single-tooth tenaculum for better exposure to potentially visualize the base of the stalk.  The polyp was then grasped with a polyp forceps and I placed 2 endocervical loops separately at the base of the stalk tightly.  The base of the polyp was then grasped with a Heaney clamp and transected with a curved Mayo scissors.  I was then able to visualize with a hysteroscope and that the knots were well below the Heaney clamp and the Heaney clamp was removed and the pedicle was hemostatic.  That pedicle was eventually trimmed.  I then placed a hysteroscope and advanced into the uterine fundus and the findings above were noted.  The endometrial cavity looked pretty clean.  No obvious abnormalities, no polyps or fibroids noted.  A curettage of all 4 quadrants of  the uterus was performed.  Upon removal of the hysteroscope, I was able to see that the stalk of the cervical polyp was in the posterior portion of the cervix, so I avoided curettage of the posterior portion coming out of the cervix.  Once the curettage was performed, the sutures were remained intact.  Upon completion of the procedure, I did trim the stalk a little bit more and one of the sutures was cut, but the 2nd suture remained and it was very hemostatic and there was just a small portion of the stalk that was visualized.  10 mL of 2% lidocaine was injected paracervically in the lower uterine segment.  Hemostasis was achieved with silver nitrate.  The cervical os was inspected for hemostasis.  No  active bleeding noted.  All instruments were removed from the vagina.  The patient tolerated the procedure well.  All instrument, sponge, and needle counts were correct x3.  She received Ancef 2 g IV prior to procedure.  She had SCDs on and operating during the case and she went to the recovery room in stable condition.     Pieter Partridge, MD     EBV/MEDQ  D:  12/22/2012  T:  12/23/2012  Job:  [id number]

## 2012-12-24 ENCOUNTER — Encounter (HOSPITAL_COMMUNITY): Payer: Self-pay | Admitting: Obstetrics and Gynecology

## 2013-01-05 ENCOUNTER — Other Ambulatory Visit (HOSPITAL_COMMUNITY): Payer: Self-pay | Admitting: Family Medicine

## 2013-01-05 DIAGNOSIS — Z1231 Encounter for screening mammogram for malignant neoplasm of breast: Secondary | ICD-10-CM

## 2013-01-19 ENCOUNTER — Ambulatory Visit (HOSPITAL_COMMUNITY)
Admission: RE | Admit: 2013-01-19 | Discharge: 2013-01-19 | Disposition: A | Discharge status: Home / Self Care | Payer: 59 | Source: Ambulatory Visit | Attending: Family Medicine | Admitting: Family Medicine

## 2013-01-19 DIAGNOSIS — Z1231 Encounter for screening mammogram for malignant neoplasm of breast: Secondary | ICD-10-CM | POA: Insufficient documentation

## 2013-02-07 ENCOUNTER — Other Ambulatory Visit: Payer: Self-pay

## 2013-04-13 ENCOUNTER — Other Ambulatory Visit: Payer: Self-pay | Admitting: Internal Medicine

## 2013-10-29 ENCOUNTER — Other Ambulatory Visit: Payer: Self-pay

## 2013-12-02 ENCOUNTER — Other Ambulatory Visit (HOSPITAL_COMMUNITY): Payer: Self-pay | Admitting: Family Medicine

## 2013-12-02 DIAGNOSIS — Z1231 Encounter for screening mammogram for malignant neoplasm of breast: Secondary | ICD-10-CM

## 2014-01-22 ENCOUNTER — Ambulatory Visit (HOSPITAL_COMMUNITY)
Admission: RE | Admit: 2014-01-22 | Discharge: 2014-01-22 | Disposition: A | Discharge status: Home / Self Care | Payer: 59 | Source: Ambulatory Visit | Attending: Family Medicine | Admitting: Family Medicine

## 2014-01-22 DIAGNOSIS — Z1231 Encounter for screening mammogram for malignant neoplasm of breast: Secondary | ICD-10-CM

## 2014-10-08 ENCOUNTER — Other Ambulatory Visit: Payer: Self-pay

## 2014-11-09 ENCOUNTER — Other Ambulatory Visit (HOSPITAL_COMMUNITY)
Admission: RE | Admit: 2014-11-09 | Discharge: 2014-11-09 | Disposition: A | Discharge status: Home / Self Care | Payer: 59 | Source: Ambulatory Visit | Attending: Family Medicine | Admitting: Family Medicine

## 2014-11-09 ENCOUNTER — Other Ambulatory Visit: Payer: Self-pay | Admitting: Family Medicine

## 2014-11-09 DIAGNOSIS — Z124 Encounter for screening for malignant neoplasm of cervix: Secondary | ICD-10-CM | POA: Diagnosis not present

## 2014-11-10 LAB — CYTOLOGY - PAP

## 2014-11-30 ENCOUNTER — Other Ambulatory Visit: Payer: Self-pay | Admitting: Family Medicine

## 2014-12-01 LAB — CYTOLOGY - PAP

## 2014-12-08 ENCOUNTER — Other Ambulatory Visit (HOSPITAL_COMMUNITY): Payer: Self-pay | Admitting: Family Medicine

## 2014-12-08 DIAGNOSIS — Z1231 Encounter for screening mammogram for malignant neoplasm of breast: Secondary | ICD-10-CM

## 2015-01-24 ENCOUNTER — Ambulatory Visit (HOSPITAL_COMMUNITY)
Admission: RE | Admit: 2015-01-24 | Discharge: 2015-01-24 | Disposition: A | Discharge status: Home / Self Care | Payer: 59 | Source: Ambulatory Visit | Attending: Family Medicine | Admitting: Family Medicine

## 2015-01-24 DIAGNOSIS — Z1231 Encounter for screening mammogram for malignant neoplasm of breast: Secondary | ICD-10-CM | POA: Diagnosis present

## 2016-01-06 DIAGNOSIS — D224 Melanocytic nevi of scalp and neck: Secondary | ICD-10-CM | POA: Diagnosis not present

## 2016-01-06 DIAGNOSIS — L821 Other seborrheic keratosis: Secondary | ICD-10-CM | POA: Diagnosis not present

## 2016-01-06 DIAGNOSIS — D2272 Melanocytic nevi of left lower limb, including hip: Secondary | ICD-10-CM | POA: Diagnosis not present

## 2016-01-06 DIAGNOSIS — L918 Other hypertrophic disorders of the skin: Secondary | ICD-10-CM | POA: Diagnosis not present

## 2016-01-06 DIAGNOSIS — D1801 Hemangioma of skin and subcutaneous tissue: Secondary | ICD-10-CM | POA: Diagnosis not present

## 2016-01-06 DIAGNOSIS — L718 Other rosacea: Secondary | ICD-10-CM | POA: Diagnosis not present

## 2016-01-06 DIAGNOSIS — L218 Other seborrheic dermatitis: Secondary | ICD-10-CM | POA: Diagnosis not present

## 2016-01-06 DIAGNOSIS — D2239 Melanocytic nevi of other parts of face: Secondary | ICD-10-CM | POA: Diagnosis not present

## 2016-01-06 DIAGNOSIS — D2262 Melanocytic nevi of left upper limb, including shoulder: Secondary | ICD-10-CM | POA: Diagnosis not present

## 2016-01-25 ENCOUNTER — Other Ambulatory Visit: Payer: Self-pay

## 2016-01-25 DIAGNOSIS — Z1231 Encounter for screening mammogram for malignant neoplasm of breast: Secondary | ICD-10-CM

## 2016-01-25 MED FILL — FOLIC ACID 1 MG TABLET: 1 | 90 days supply | Qty: 180 | Fill #0

## 2016-02-10 MED FILL — HYDROCHLOROTHIAZIDE 25 MG T: 25 | 90 days supply | Qty: 90 | Fill #2

## 2016-02-10 MED FILL — PANTOPRAZOLE SOD DR 40 MG T: 40 | 90 days supply | Qty: 90 | Fill #1

## 2016-02-10 MED FILL — METHOTREXATE 2.5 MG TABLET: 2.5 | 84 days supply | Qty: 108 | Fill #1

## 2016-02-14 ENCOUNTER — Ambulatory Visit
Admission: RE | Admit: 2016-02-14 | Discharge: 2016-02-14 | Disposition: A | Discharge status: Home / Self Care | Payer: 59 | Source: Ambulatory Visit

## 2016-02-14 DIAGNOSIS — Z1231 Encounter for screening mammogram for malignant neoplasm of breast: Secondary | ICD-10-CM | POA: Diagnosis not present

## 2016-02-21 DIAGNOSIS — Z79899 Other long term (current) drug therapy: Secondary | ICD-10-CM | POA: Diagnosis not present

## 2016-02-21 DIAGNOSIS — M0579 Rheumatoid arthritis with rheumatoid factor of multiple sites without organ or systems involvement: Secondary | ICD-10-CM | POA: Diagnosis not present

## 2016-02-21 DIAGNOSIS — M2550 Pain in unspecified joint: Secondary | ICD-10-CM | POA: Diagnosis not present

## 2016-02-21 MED FILL — MELOXICAM 15 MG TABLET: 15 | 90 days supply | Qty: 90 | Fill #0

## 2016-03-15 MED FILL — OSELTAMIVIR PHOS 75 MG CAP: 75 | 5 days supply | Qty: 10 | Fill #0

## 2016-03-23 DIAGNOSIS — R05 Cough: Secondary | ICD-10-CM | POA: Diagnosis not present

## 2016-03-23 MED FILL — levoFLOXacin 500 MG TABS: 500 | 7 days supply | Qty: 7 | Fill #0

## 2016-05-10 MED FILL — HYDROCHLOROTHIAZIDE 25 MG T: 25 | 90 days supply | Qty: 90 | Fill #3

## 2016-05-22 DIAGNOSIS — M25552 Pain in left hip: Secondary | ICD-10-CM | POA: Diagnosis not present

## 2016-05-22 DIAGNOSIS — M2550 Pain in unspecified joint: Secondary | ICD-10-CM | POA: Diagnosis not present

## 2016-05-22 DIAGNOSIS — Z79899 Other long term (current) drug therapy: Secondary | ICD-10-CM | POA: Diagnosis not present

## 2016-05-22 DIAGNOSIS — M25551 Pain in right hip: Secondary | ICD-10-CM | POA: Diagnosis not present

## 2016-05-22 DIAGNOSIS — M79641 Pain in right hand: Secondary | ICD-10-CM | POA: Diagnosis not present

## 2016-05-22 DIAGNOSIS — M0579 Rheumatoid arthritis with rheumatoid factor of multiple sites without organ or systems involvement: Secondary | ICD-10-CM | POA: Diagnosis not present

## 2016-05-22 DIAGNOSIS — M79642 Pain in left hand: Secondary | ICD-10-CM | POA: Diagnosis not present

## 2016-05-29 MED FILL — MELOXICAM 15 MG TABLET: 15 | 90 days supply | Qty: 90 | Fill #1

## 2016-05-29 MED FILL — FOLIC ACID 1 MG TABLET: 1 | 90 days supply | Qty: 180 | Fill #1

## 2016-06-11 MED FILL — METHOTREXATE 2.5 MG TABLET: 2.5 | 84 days supply | Qty: 108 | Fill #0

## 2016-08-08 MED FILL — HYDROCHLOROTHIAZIDE 25 MG T: 25 | 90 days supply | Qty: 90 | Fill #0

## 2016-08-14 DIAGNOSIS — L720 Epidermal cyst: Secondary | ICD-10-CM | POA: Diagnosis not present

## 2016-08-14 DIAGNOSIS — L821 Other seborrheic keratosis: Secondary | ICD-10-CM | POA: Diagnosis not present

## 2016-08-14 DIAGNOSIS — L918 Other hypertrophic disorders of the skin: Secondary | ICD-10-CM | POA: Diagnosis not present

## 2016-08-14 DIAGNOSIS — L820 Inflamed seborrheic keratosis: Secondary | ICD-10-CM | POA: Diagnosis not present

## 2016-08-21 DIAGNOSIS — M2550 Pain in unspecified joint: Secondary | ICD-10-CM | POA: Diagnosis not present

## 2016-08-21 DIAGNOSIS — M0579 Rheumatoid arthritis with rheumatoid factor of multiple sites without organ or systems involvement: Secondary | ICD-10-CM | POA: Diagnosis not present

## 2016-08-21 DIAGNOSIS — Z79899 Other long term (current) drug therapy: Secondary | ICD-10-CM | POA: Diagnosis not present

## 2016-08-31 MED FILL — MELOXICAM 15 MG TABLET: 15 | 90 days supply | Qty: 90 | Fill #0

## 2016-08-31 MED FILL — FOLIC ACID 1 MG TABLET: 1 | 90 days supply | Qty: 180 | Fill #0

## 2016-08-31 MED FILL — METHOTREXATE 2.5 MG TABLET: 2.5 | 84 days supply | Qty: 108 | Fill #0

## 2016-09-13 MED FILL — PANTOPRAZOLE SOD DR 40 MG T: 40 | 90 days supply | Qty: 90 | Fill #0

## 2016-10-16 ENCOUNTER — Other Ambulatory Visit: Payer: Self-pay | Admitting: Family Medicine

## 2016-10-16 ENCOUNTER — Other Ambulatory Visit (HOSPITAL_COMMUNITY)
Admission: RE | Admit: 2016-10-16 | Discharge: 2016-10-16 | Disposition: A | Discharge status: Home / Self Care | Payer: 59 | Source: Ambulatory Visit | Attending: Family Medicine | Admitting: Family Medicine

## 2016-10-16 DIAGNOSIS — Z1151 Encounter for screening for human papillomavirus (HPV): Secondary | ICD-10-CM | POA: Diagnosis not present

## 2016-10-16 DIAGNOSIS — E785 Hyperlipidemia, unspecified: Secondary | ICD-10-CM | POA: Diagnosis not present

## 2016-10-16 DIAGNOSIS — E559 Vitamin D deficiency, unspecified: Secondary | ICD-10-CM | POA: Diagnosis not present

## 2016-10-16 DIAGNOSIS — I10 Essential (primary) hypertension: Secondary | ICD-10-CM | POA: Diagnosis not present

## 2016-10-16 DIAGNOSIS — Z124 Encounter for screening for malignant neoplasm of cervix: Secondary | ICD-10-CM | POA: Diagnosis not present

## 2016-10-16 DIAGNOSIS — Z6839 Body mass index (BMI) 39.0-39.9, adult: Secondary | ICD-10-CM | POA: Diagnosis not present

## 2016-10-16 DIAGNOSIS — Z0000 Encounter for general adult medical examination without abnormal findings: Secondary | ICD-10-CM | POA: Diagnosis not present

## 2016-10-16 DIAGNOSIS — Z01411 Encounter for gynecological examination (general) (routine) with abnormal findings: Secondary | ICD-10-CM | POA: Insufficient documentation

## 2016-10-16 DIAGNOSIS — M069 Rheumatoid arthritis, unspecified: Secondary | ICD-10-CM | POA: Diagnosis not present

## 2016-10-16 MED FILL — FLUCONAZOLE 150 MG TABLET: 150 | 84 days supply | Qty: 12 | Fill #0

## 2016-10-19 LAB — CYTOLOGY - PAP
DIAGNOSIS: NEGATIVE
HPV (WINDOPATH): NOT DETECTED

## 2016-10-29 MED FILL — AZITHROMYCIN 250 MG TABLET: 250 | 5 days supply | Qty: 6 | Fill #0

## 2016-11-06 MED FILL — HYDROCHLOROTHIAZIDE 25 MG T: 25 | 90 days supply | Qty: 90 | Fill #1

## 2016-11-29 DIAGNOSIS — H524 Presbyopia: Secondary | ICD-10-CM | POA: Diagnosis not present

## 2016-11-29 DIAGNOSIS — H5213 Myopia, bilateral: Secondary | ICD-10-CM | POA: Diagnosis not present

## 2016-11-29 DIAGNOSIS — H52203 Unspecified astigmatism, bilateral: Secondary | ICD-10-CM | POA: Diagnosis not present

## 2016-12-03 MED FILL — MELOXICAM 15 MG TABLET: 15 | 90 days supply | Qty: 90 | Fill #0

## 2016-12-03 MED FILL — FOLIC ACID 1 MG TABLET: 1 | 90 days supply | Qty: 180 | Fill #0

## 2016-12-06 DIAGNOSIS — Z79899 Other long term (current) drug therapy: Secondary | ICD-10-CM | POA: Diagnosis not present

## 2016-12-06 DIAGNOSIS — E669 Obesity, unspecified: Secondary | ICD-10-CM | POA: Diagnosis not present

## 2016-12-06 DIAGNOSIS — M0579 Rheumatoid arthritis with rheumatoid factor of multiple sites without organ or systems involvement: Secondary | ICD-10-CM | POA: Diagnosis not present

## 2016-12-06 DIAGNOSIS — M2550 Pain in unspecified joint: Secondary | ICD-10-CM | POA: Diagnosis not present

## 2016-12-06 DIAGNOSIS — Z6839 Body mass index (BMI) 39.0-39.9, adult: Secondary | ICD-10-CM | POA: Diagnosis not present

## 2016-12-31 MED FILL — METHOTREXATE 2.5 MG TABLET: 2.5 | 84 days supply | Qty: 108 | Fill #0

## 2017-01-15 ENCOUNTER — Other Ambulatory Visit: Payer: Self-pay | Admitting: Family Medicine

## 2017-01-15 DIAGNOSIS — Z1231 Encounter for screening mammogram for malignant neoplasm of breast: Secondary | ICD-10-CM

## 2017-02-04 MED FILL — HYDROCHLOROTHIAZIDE 25 MG T: 25 | 90 days supply | Qty: 90 | Fill #2

## 2017-02-15 ENCOUNTER — Ambulatory Visit
Admission: RE | Admit: 2017-02-15 | Discharge: 2017-02-15 | Disposition: A | Discharge status: Home / Self Care | Payer: 59 | Source: Ambulatory Visit | Attending: Family Medicine | Admitting: Family Medicine

## 2017-02-15 ENCOUNTER — Encounter: Payer: Self-pay | Admitting: Radiology

## 2017-02-15 DIAGNOSIS — Z1231 Encounter for screening mammogram for malignant neoplasm of breast: Secondary | ICD-10-CM

## 2017-03-12 DIAGNOSIS — M2550 Pain in unspecified joint: Secondary | ICD-10-CM | POA: Diagnosis not present

## 2017-03-12 DIAGNOSIS — M0579 Rheumatoid arthritis with rheumatoid factor of multiple sites without organ or systems involvement: Secondary | ICD-10-CM | POA: Diagnosis not present

## 2017-03-12 DIAGNOSIS — J988 Other specified respiratory disorders: Secondary | ICD-10-CM | POA: Diagnosis not present

## 2017-03-12 DIAGNOSIS — Z79899 Other long term (current) drug therapy: Secondary | ICD-10-CM | POA: Diagnosis not present

## 2017-03-12 DIAGNOSIS — Z6841 Body Mass Index (BMI) 40.0 and over, adult: Secondary | ICD-10-CM | POA: Diagnosis not present

## 2017-03-13 MED FILL — FOLIC ACID 1 MG TABLET: 1 | 90 days supply | Qty: 180 | Fill #0

## 2017-03-13 MED FILL — MELOXICAM 15 MG TABLET: 15 | 90 days supply | Qty: 90 | Fill #0

## 2017-04-01 MED FILL — METHOTREXATE 2.5 MG TABLET: 2.5 | 84 days supply | Qty: 108 | Fill #0

## 2017-04-24 MED FILL — PANTOPRAZOLE SOD DR 40 MG T: 40 | 90 days supply | Qty: 90 | Fill #1

## 2017-05-07 MED FILL — HYDROCHLOROTHIAZIDE 25 MG T: 25 | 90 days supply | Qty: 90 | Fill #3

## 2017-05-22 DIAGNOSIS — D2239 Melanocytic nevi of other parts of face: Secondary | ICD-10-CM | POA: Diagnosis not present

## 2017-05-22 DIAGNOSIS — D1801 Hemangioma of skin and subcutaneous tissue: Secondary | ICD-10-CM | POA: Diagnosis not present

## 2017-05-22 DIAGNOSIS — L821 Other seborrheic keratosis: Secondary | ICD-10-CM | POA: Diagnosis not present

## 2017-05-22 DIAGNOSIS — L814 Other melanin hyperpigmentation: Secondary | ICD-10-CM | POA: Diagnosis not present

## 2017-05-22 DIAGNOSIS — D2372 Other benign neoplasm of skin of left lower limb, including hip: Secondary | ICD-10-CM | POA: Diagnosis not present

## 2017-05-22 DIAGNOSIS — L298 Other pruritus: Secondary | ICD-10-CM | POA: Diagnosis not present

## 2017-05-22 DIAGNOSIS — D225 Melanocytic nevi of trunk: Secondary | ICD-10-CM | POA: Diagnosis not present

## 2017-05-22 DIAGNOSIS — L918 Other hypertrophic disorders of the skin: Secondary | ICD-10-CM | POA: Diagnosis not present

## 2017-05-22 DIAGNOSIS — I788 Other diseases of capillaries: Secondary | ICD-10-CM | POA: Diagnosis not present

## 2017-06-11 DIAGNOSIS — Z6841 Body Mass Index (BMI) 40.0 and over, adult: Secondary | ICD-10-CM | POA: Diagnosis not present

## 2017-06-11 DIAGNOSIS — Z79899 Other long term (current) drug therapy: Secondary | ICD-10-CM | POA: Diagnosis not present

## 2017-06-11 DIAGNOSIS — M0579 Rheumatoid arthritis with rheumatoid factor of multiple sites without organ or systems involvement: Secondary | ICD-10-CM | POA: Diagnosis not present

## 2017-06-11 DIAGNOSIS — M2550 Pain in unspecified joint: Secondary | ICD-10-CM | POA: Diagnosis not present

## 2017-06-17 MED FILL — MELOXICAM 15 MG TABLET: 15 | 90 days supply | Qty: 90 | Fill #0

## 2017-06-17 MED FILL — FOLIC ACID 1 MG TABLET: 1 | 90 days supply | Qty: 180 | Fill #0

## 2017-07-15 MED FILL — METHOTREXATE 2.5 MG TABLET: 2.5 | 84 days supply | Qty: 108 | Fill #0

## 2017-08-06 MED FILL — HYDROCHLOROTHIAZIDE 25 MG T: 25 | 90 days supply | Qty: 90 | Fill #0

## 2017-09-17 DIAGNOSIS — E669 Obesity, unspecified: Secondary | ICD-10-CM | POA: Diagnosis not present

## 2017-09-17 DIAGNOSIS — Z79899 Other long term (current) drug therapy: Secondary | ICD-10-CM | POA: Diagnosis not present

## 2017-09-17 DIAGNOSIS — M2550 Pain in unspecified joint: Secondary | ICD-10-CM | POA: Diagnosis not present

## 2017-09-17 DIAGNOSIS — M0579 Rheumatoid arthritis with rheumatoid factor of multiple sites without organ or systems involvement: Secondary | ICD-10-CM | POA: Diagnosis not present

## 2017-09-17 DIAGNOSIS — Z6839 Body mass index (BMI) 39.0-39.9, adult: Secondary | ICD-10-CM | POA: Diagnosis not present

## 2017-09-17 MED FILL — FOLIC ACID 1 MG TABLET: 1 | 90 days supply | Qty: 180 | Fill #0

## 2017-09-17 MED FILL — MELOXICAM 15 MG TABLET: 15 | 90 days supply | Qty: 90 | Fill #0

## 2017-10-17 MED FILL — METHOTREXATE 2.5 MG TABLET: 2.5 | 84 days supply | Qty: 108 | Fill #1

## 2017-10-22 DIAGNOSIS — I10 Essential (primary) hypertension: Secondary | ICD-10-CM | POA: Diagnosis not present

## 2017-10-22 DIAGNOSIS — L409 Psoriasis, unspecified: Secondary | ICD-10-CM | POA: Diagnosis not present

## 2017-10-22 DIAGNOSIS — J45909 Unspecified asthma, uncomplicated: Secondary | ICD-10-CM | POA: Diagnosis not present

## 2017-10-22 DIAGNOSIS — M069 Rheumatoid arthritis, unspecified: Secondary | ICD-10-CM | POA: Diagnosis not present

## 2017-10-22 DIAGNOSIS — E785 Hyperlipidemia, unspecified: Secondary | ICD-10-CM | POA: Diagnosis not present

## 2017-10-22 DIAGNOSIS — Z0000 Encounter for general adult medical examination without abnormal findings: Secondary | ICD-10-CM | POA: Diagnosis not present

## 2017-10-22 DIAGNOSIS — E559 Vitamin D deficiency, unspecified: Secondary | ICD-10-CM | POA: Diagnosis not present

## 2017-11-06 MED FILL — PANTOPRAZOLE SOD DR 40 MG T: 40 | 90 days supply | Qty: 90 | Fill #0

## 2017-11-06 MED FILL — HYDROCHLOROTHIAZIDE 25 MG T: 25 | 90 days supply | Qty: 90 | Fill #1

## 2017-12-10 DIAGNOSIS — Z6838 Body mass index (BMI) 38.0-38.9, adult: Secondary | ICD-10-CM | POA: Diagnosis not present

## 2017-12-10 DIAGNOSIS — E669 Obesity, unspecified: Secondary | ICD-10-CM | POA: Diagnosis not present

## 2017-12-10 DIAGNOSIS — Z79899 Other long term (current) drug therapy: Secondary | ICD-10-CM | POA: Diagnosis not present

## 2017-12-10 DIAGNOSIS — M0579 Rheumatoid arthritis with rheumatoid factor of multiple sites without organ or systems involvement: Secondary | ICD-10-CM | POA: Diagnosis not present

## 2017-12-10 DIAGNOSIS — M2550 Pain in unspecified joint: Secondary | ICD-10-CM | POA: Diagnosis not present

## 2017-12-13 DIAGNOSIS — H524 Presbyopia: Secondary | ICD-10-CM | POA: Diagnosis not present

## 2017-12-13 DIAGNOSIS — H5213 Myopia, bilateral: Secondary | ICD-10-CM | POA: Diagnosis not present

## 2017-12-13 DIAGNOSIS — H52203 Unspecified astigmatism, bilateral: Secondary | ICD-10-CM | POA: Diagnosis not present

## 2017-12-19 MED FILL — FOLIC ACID 1 MG TABLET: 1 | 90 days supply | Qty: 180 | Fill #0

## 2017-12-19 MED FILL — MELOXICAM 15 MG TABLET: 15 | 90 days supply | Qty: 90 | Fill #0

## 2018-01-07 MED FILL — METHOTREXATE 2.5 MG TABLET: 2.5 | 84 days supply | Qty: 108 | Fill #0

## 2018-01-17 ENCOUNTER — Other Ambulatory Visit: Payer: Self-pay | Admitting: Family Medicine

## 2018-01-17 DIAGNOSIS — Z1231 Encounter for screening mammogram for malignant neoplasm of breast: Secondary | ICD-10-CM

## 2018-02-05 MED FILL — HYDROCHLOROTHIAZIDE 25 MG T: 25 | 90 days supply | Qty: 90 | Fill #2

## 2018-02-18 ENCOUNTER — Ambulatory Visit
Admission: RE | Admit: 2018-02-18 | Discharge: 2018-02-18 | Disposition: A | Discharge status: Home / Self Care | Payer: 59 | Source: Ambulatory Visit | Attending: Family Medicine | Admitting: Family Medicine

## 2018-02-18 ENCOUNTER — Ambulatory Visit: Payer: 59

## 2018-02-18 DIAGNOSIS — Z1231 Encounter for screening mammogram for malignant neoplasm of breast: Secondary | ICD-10-CM | POA: Diagnosis not present

## 2018-03-11 DIAGNOSIS — Z6839 Body mass index (BMI) 39.0-39.9, adult: Secondary | ICD-10-CM | POA: Diagnosis not present

## 2018-03-11 DIAGNOSIS — Z79899 Other long term (current) drug therapy: Secondary | ICD-10-CM | POA: Diagnosis not present

## 2018-03-11 DIAGNOSIS — E669 Obesity, unspecified: Secondary | ICD-10-CM | POA: Diagnosis not present

## 2018-03-11 DIAGNOSIS — M0579 Rheumatoid arthritis with rheumatoid factor of multiple sites without organ or systems involvement: Secondary | ICD-10-CM | POA: Diagnosis not present

## 2018-03-11 DIAGNOSIS — M2550 Pain in unspecified joint: Secondary | ICD-10-CM | POA: Diagnosis not present

## 2018-03-21 MED FILL — MELOXICAM 15 MG TABLET: 15 | 90 days supply | Qty: 90 | Fill #1

## 2018-03-25 DIAGNOSIS — M0579 Rheumatoid arthritis with rheumatoid factor of multiple sites without organ or systems involvement: Secondary | ICD-10-CM | POA: Diagnosis not present

## 2018-03-31 MED FILL — METHOTREXATE SODIUM 2.5 MG: 2.5 | 84 days supply | Qty: 108 | Fill #1

## 2018-03-31 MED FILL — FOLIC ACID 1 MG TABLET: 1 | 90 days supply | Qty: 180 | Fill #1

## 2018-04-14 ENCOUNTER — Telehealth: Payer: Self-pay | Admitting: Pharmacist

## 2018-04-14 NOTE — Telephone Encounter (Signed)
Called patient to schedule an appointment for the [facility]. I was unable to reach the patient and VM was full so secure email sent to employee email address.

## 2018-04-17 ENCOUNTER — Ambulatory Visit (INDEPENDENT_AMBULATORY_CARE_PROVIDER_SITE_OTHER): Payer: 59 | Admitting: Pharmacist

## 2018-04-17 ENCOUNTER — Encounter: Payer: Self-pay | Admitting: Pharmacist

## 2018-04-17 DIAGNOSIS — Z79899 Other long term (current) drug therapy: Secondary | ICD-10-CM

## 2018-04-17 MED ORDER — ADALIMUMAB 40 MG/0.4ML ~~LOC~~ AJKT: 1.0000 "pen " | AUTO-INJECTOR | SUBCUTANEOUS | 6 refills | Status: DC

## 2018-04-17 MED FILL — HUMIRA PEN 40 MG/0.4ML PNKT: 40 | 28 days supply | Qty: 2 | Fill #0

## 2018-04-17 NOTE — Progress Notes (Signed)
   S: Patient presents to [facility] for review of their specialty medication therapy.  Patient is currently taking Humira for rheumatoid arthritis. Patient is managed by Dr. Trudie Reed for this. She has been on methotrexate for the last 7 years. Plans to continue on the methotrexate for now.   Adherence: she has not started it yet. She plans to start as soon as she gets the medication from the pharmacy. She has a Sports administrator who will help her with the first injection.   Efficacy: she has not started it yet.   Dosing: Rheumatoid arthritis: SubQ: 40 mg every other week (may continue methotrexate, other nonbiologic DMARDS, corticosteroids, NSAIDs, and/or analgesics); patients not taking concomitant methotrexate may increase dose to 40 mg every week   Dose adjustments: Renal: no dose adjustments (has not been studied) Hepatic: no dose adjustments (has not been studied)  Screening: TB test: completed and was negative per patient Hepatitis: completed per patient  Monitoring: S/sx of infection: denies CBC: monitored q3 months as she has been on methotrexate S/sx of hypersensitivity: has not started S/sx of malignancy: denies S/sx of heart failure: denies  Patient has already been educated by the Humira Nurse and was able to verbalize the most common adverse effects.  O:     Lab Results  Component Value Date   WBC 7.3 12/18/2012   HGB 12.8 12/18/2012   HCT 40.0 12/18/2012   MCV 91.7 12/18/2012   PLT 269 12/18/2012      Chemistry      Component Value Date/Time   NA 139 12/18/2012 0925   K 4.0 12/18/2012 0925   CL 103 12/18/2012 0925   CO2 30 12/18/2012 0925   BUN 14 12/18/2012 0925   CREATININE 0.67 12/18/2012 0925      Component Value Date/Time   CALCIUM 9.3 12/18/2012 0925   ALKPHOS 47 06/29/2009 2026   AST 17 06/29/2009 2026   ALT 19 06/29/2009 2026   BILITOT 0.4 06/29/2009 2026       A/P: 1. Medication review: Patient currently on Humira for  rheumatoid arthritis. Reviewed the medication with the patient, including the following: Humira is a TNF blocking agent indicated for ankylosing spondylitis, Crohn's disease, Hidradenitis suppurativa, psoriatic arthritis, plaque psoriasis, ulcerative colitis, and uveitis. Patient educated on purpose, proper use and potential adverse effects of Humira. The most common adverse effects are infections, headache, and injection site reactions. 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 There is the possibility of an increased risk of malignancy but it is not well understood if this increased risk is due to there medication or the disease state. There are rare cases of pancytopenia and aplastic anemia. No recommendations for any changes at this time.   Christella Hartigan, PharmD, BCPS, BCACP, CPP Clinical Pharmacist Practitioner  [phone]

## 2018-05-08 MED FILL — PANTOPRAZOLE SOD DR 40 MG T: 40 | 90 days supply | Qty: 90 | Fill #1

## 2018-05-09 MED FILL — HYDROCHLOROTHIAZIDE 25 MG T: 25 | 90 days supply | Qty: 90 | Fill #3

## 2018-05-20 DIAGNOSIS — L720 Epidermal cyst: Secondary | ICD-10-CM | POA: Diagnosis not present

## 2018-05-20 DIAGNOSIS — L2089 Other atopic dermatitis: Secondary | ICD-10-CM | POA: Diagnosis not present

## 2018-05-20 DIAGNOSIS — L821 Other seborrheic keratosis: Secondary | ICD-10-CM | POA: Diagnosis not present

## 2018-05-20 DIAGNOSIS — L814 Other melanin hyperpigmentation: Secondary | ICD-10-CM | POA: Diagnosis not present

## 2018-05-20 DIAGNOSIS — L918 Other hypertrophic disorders of the skin: Secondary | ICD-10-CM | POA: Diagnosis not present

## 2018-05-20 DIAGNOSIS — D225 Melanocytic nevi of trunk: Secondary | ICD-10-CM | POA: Diagnosis not present

## 2018-05-20 DIAGNOSIS — D1801 Hemangioma of skin and subcutaneous tissue: Secondary | ICD-10-CM | POA: Diagnosis not present

## 2018-05-26 MED FILL — HUMIRA PEN 40 MG/0.4ML PNKT: 40 | 28 days supply | Qty: 2 | Fill #1

## 2018-06-10 DIAGNOSIS — M0579 Rheumatoid arthritis with rheumatoid factor of multiple sites without organ or systems involvement: Secondary | ICD-10-CM | POA: Diagnosis not present

## 2018-06-10 DIAGNOSIS — Z79899 Other long term (current) drug therapy: Secondary | ICD-10-CM | POA: Diagnosis not present

## 2018-06-10 DIAGNOSIS — M2550 Pain in unspecified joint: Secondary | ICD-10-CM | POA: Diagnosis not present

## 2018-06-10 DIAGNOSIS — E669 Obesity, unspecified: Secondary | ICD-10-CM | POA: Diagnosis not present

## 2018-06-10 DIAGNOSIS — Z6839 Body mass index (BMI) 39.0-39.9, adult: Secondary | ICD-10-CM | POA: Diagnosis not present

## 2018-06-23 MED FILL — HUMIRA PEN 40 MG/0.4ML PNKT: 40 | 28 days supply | Qty: 2 | Fill #2

## 2018-06-24 MED FILL — METHOTREXATE SODIUM 2.5 MG: 2.5 | 84 days supply | Qty: 108 | Fill #0

## 2018-06-24 MED FILL — MELOXICAM 15 MG TABLET: 15 | 90 days supply | Qty: 90 | Fill #0

## 2018-07-24 MED FILL — HUMIRA PEN 40 MG/0.4ML PNKT: 40 | 28 days supply | Qty: 2 | Fill #3

## 2018-07-25 MED FILL — FOLIC ACID 1 MG TABS: 1 | 90 days supply | Qty: 180 | Fill #0

## 2018-08-04 MED FILL — HYDROCHLOROTHIAZIDE 25 MG T: 25 | 90 days supply | Qty: 90 | Fill #0

## 2018-08-18 MED FILL — HUMIRA PEN 40 MG/0.4ML PNKT: 40 | 28 days supply | Qty: 2 | Fill #4

## 2018-08-19 ENCOUNTER — Ambulatory Visit: Payer: Self-pay | Admitting: Podiatry

## 2018-09-16 DIAGNOSIS — M0579 Rheumatoid arthritis with rheumatoid factor of multiple sites without organ or systems involvement: Secondary | ICD-10-CM | POA: Diagnosis not present

## 2018-09-16 DIAGNOSIS — Z79899 Other long term (current) drug therapy: Secondary | ICD-10-CM | POA: Diagnosis not present

## 2018-09-16 DIAGNOSIS — Z6841 Body Mass Index (BMI) 40.0 and over, adult: Secondary | ICD-10-CM | POA: Diagnosis not present

## 2018-09-16 DIAGNOSIS — M2550 Pain in unspecified joint: Secondary | ICD-10-CM | POA: Diagnosis not present

## 2018-09-16 MED FILL — METHOTREXATE SODIUM 2.5 MG: 2.5 | 84 days supply | Qty: 96 | Fill #0

## 2018-09-16 MED FILL — HUMIRA PEN 40 MG/0.4ML PNKT: 40 | 28 days supply | Qty: 2 | Fill #5

## 2018-09-19 ENCOUNTER — Encounter: Payer: Self-pay | Admitting: Podiatry

## 2018-09-19 ENCOUNTER — Ambulatory Visit: Payer: 59 | Admitting: Podiatry

## 2018-09-19 DIAGNOSIS — B070 Plantar wart: Secondary | ICD-10-CM | POA: Diagnosis not present

## 2018-09-19 MED FILL — MELOXICAM 15 MG TABLET: 15 | 90 days supply | Qty: 90 | Fill #0

## 2018-09-22 NOTE — Progress Notes (Signed)
Subjective:   Patient ID: [name], female   DOB: 57 y.o.   MRN: [id number]   HPI Patient presents with lesions on the plantar aspect of both feet that make it hard to walk on and they have been present for a while and she is tried over-the-counter medication.  Patient does not smoke and likes to be active   Review of Systems  All other systems reviewed and are negative.       Objective:  Physical Exam  Constitutional: She appears well-developed and well-nourished.  Cardiovascular: Intact distal pulses.  Pulmonary/Chest: Effort normal.  Musculoskeletal: Normal range of motion.  Neurological: She is alert.  Skin: Skin is warm.  Nursing note and vitals reviewed.   Neurovascular status intact muscle strength is adequate range of motion within normal limits with patient found to have plantar lesions bilateral that upon debridement shows pinpoint bleeding and they are painful to lateral pressure and also have somewhat lucent course.  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  Applied sterile dressings and instructed on soaks and leave the dressings on for 24 hours and reappoint if symptoms persist or get worse

## 2018-10-20 MED FILL — FOLIC ACID 1 MG TABS: 1 | 90 days supply | Qty: 180 | Fill #0

## 2018-10-24 MED FILL — HUMIRA PEN 40 MG/0.4ML PNKT: 40 | 28 days supply | Qty: 2 | Fill #6

## 2018-11-03 MED FILL — HYDROCHLOROTHIAZIDE 25 MG T: 25 | 90 days supply | Qty: 90 | Fill #0

## 2018-11-18 DIAGNOSIS — M069 Rheumatoid arthritis, unspecified: Secondary | ICD-10-CM | POA: Diagnosis not present

## 2018-11-18 DIAGNOSIS — I10 Essential (primary) hypertension: Secondary | ICD-10-CM | POA: Diagnosis not present

## 2018-11-18 DIAGNOSIS — Z0000 Encounter for general adult medical examination without abnormal findings: Secondary | ICD-10-CM | POA: Diagnosis not present

## 2018-11-18 DIAGNOSIS — E785 Hyperlipidemia, unspecified: Secondary | ICD-10-CM | POA: Diagnosis not present

## 2018-11-18 DIAGNOSIS — J45909 Unspecified asthma, uncomplicated: Secondary | ICD-10-CM | POA: Diagnosis not present

## 2018-11-18 MED FILL — raNITIdine HCL 150 MG TABS: 150 | 90 days supply | Qty: 90 | Fill #0

## 2018-11-18 MED FILL — FLUCONAZOLE 150 MG TABS: 150 | 84 days supply | Qty: 12 | Fill #0

## 2018-11-24 ENCOUNTER — Other Ambulatory Visit: Payer: Self-pay | Admitting: Pharmacist

## 2018-11-24 MED ORDER — ADALIMUMAB 40 MG/0.4ML ~~LOC~~ AJKT: 1.0000 "pen " | AUTO-INJECTOR | SUBCUTANEOUS | 3 refills | Status: DC

## 2018-11-24 MED FILL — HUMIRA PEN 40 MG/0.4ML PNKT: 40 | 28 days supply | Qty: 2 | Fill #0

## 2018-12-08 MED FILL — METHOTREXATE SODIUM 2.5 MG: 2.5 | 84 days supply | Qty: 96 | Fill #1

## 2018-12-15 DIAGNOSIS — M2550 Pain in unspecified joint: Secondary | ICD-10-CM | POA: Diagnosis not present

## 2018-12-15 DIAGNOSIS — M0579 Rheumatoid arthritis with rheumatoid factor of multiple sites without organ or systems involvement: Secondary | ICD-10-CM | POA: Diagnosis not present

## 2018-12-15 DIAGNOSIS — Z79899 Other long term (current) drug therapy: Secondary | ICD-10-CM | POA: Diagnosis not present

## 2018-12-15 DIAGNOSIS — H52203 Unspecified astigmatism, bilateral: Secondary | ICD-10-CM | POA: Diagnosis not present

## 2018-12-15 DIAGNOSIS — E669 Obesity, unspecified: Secondary | ICD-10-CM | POA: Diagnosis not present

## 2018-12-15 DIAGNOSIS — Z6839 Body mass index (BMI) 39.0-39.9, adult: Secondary | ICD-10-CM | POA: Diagnosis not present

## 2018-12-15 DIAGNOSIS — H524 Presbyopia: Secondary | ICD-10-CM | POA: Diagnosis not present

## 2018-12-15 DIAGNOSIS — H5213 Myopia, bilateral: Secondary | ICD-10-CM | POA: Diagnosis not present

## 2019-01-01 ENCOUNTER — Other Ambulatory Visit: Payer: Self-pay | Admitting: Family Medicine

## 2019-01-01 DIAGNOSIS — Z1231 Encounter for screening mammogram for malignant neoplasm of breast: Secondary | ICD-10-CM

## 2019-01-07 DIAGNOSIS — I10 Essential (primary) hypertension: Secondary | ICD-10-CM | POA: Diagnosis not present

## 2019-01-07 MED FILL — AMLODIPINE BESYLATE 5 MG TA: 5 | 90 days supply | Qty: 90 | Fill #0

## 2019-01-22 MED FILL — FOLIC ACID 1 MG TABS: 1 | 90 days supply | Qty: 180 | Fill #0

## 2019-01-22 MED FILL — HUMIRA PEN 40 MG/0.4ML PNKT: 40 | 28 days supply | Qty: 2 | Fill #2

## 2019-01-22 MED FILL — HYDROCHLOROTHIAZIDE 25 MG T: 25 | 90 days supply | Qty: 90 | Fill #1

## 2019-02-20 ENCOUNTER — Ambulatory Visit
Admission: RE | Admit: 2019-02-20 | Discharge: 2019-02-20 | Disposition: A | Discharge status: Home / Self Care | Payer: 59 | Source: Ambulatory Visit | Attending: Family Medicine | Admitting: Family Medicine

## 2019-02-20 DIAGNOSIS — Z1231 Encounter for screening mammogram for malignant neoplasm of breast: Secondary | ICD-10-CM

## 2019-02-27 MED FILL — HUMIRA PEN 40 MG/0.4ML PNKT: 40 | 28 days supply | Qty: 2 | Fill #3

## 2019-03-04 MED FILL — METHOTREXATE SODIUM 2.5 MG: 2.5 | 84 days supply | Qty: 96 | Fill #0

## 2019-03-17 DIAGNOSIS — Z79899 Other long term (current) drug therapy: Secondary | ICD-10-CM | POA: Diagnosis not present

## 2019-03-17 DIAGNOSIS — M2550 Pain in unspecified joint: Secondary | ICD-10-CM | POA: Diagnosis not present

## 2019-03-17 DIAGNOSIS — M0579 Rheumatoid arthritis with rheumatoid factor of multiple sites without organ or systems involvement: Secondary | ICD-10-CM | POA: Diagnosis not present

## 2019-03-18 MED FILL — MELOXICAM 15 MG TABLET: 15 | 90 days supply | Qty: 90 | Fill #0

## 2019-03-19 ENCOUNTER — Other Ambulatory Visit: Payer: Self-pay | Admitting: Pharmacist

## 2019-03-19 MED ORDER — ADALIMUMAB 40 MG/0.4ML ~~LOC~~ AJKT: 1.0000 "pen " | AUTO-INJECTOR | SUBCUTANEOUS | 5 refills | Status: DC

## 2019-03-19 MED FILL — HUMIRA PEN 40 MG/0.4ML PNKT: 40 | 28 days supply | Qty: 2 | Fill #0

## 2019-03-23 MED FILL — AMLODIPINE BESYLATE 5 MG TA: 5 | 90 days supply | Qty: 90 | Fill #1

## 2019-03-23 MED FILL — METHOTREXATE SODIUM 2.5 MG: 2.5 | 84 days supply | Qty: 96 | Fill #0

## 2019-03-23 MED FILL — HYDROCHLOROTHIAZIDE 25 MG T: 25 | 90 days supply | Qty: 90 | Fill #2

## 2019-03-23 MED FILL — FOLIC ACID 1 MG TABS: 1 | 90 days supply | Qty: 180 | Fill #1

## 2019-04-22 MED FILL — HUMIRA PEN 40 MG/0.4ML PNKT: 40 | 28 days supply | Qty: 2 | Fill #1

## 2019-05-08 ENCOUNTER — Encounter: Payer: Self-pay | Admitting: Pharmacist

## 2019-05-08 ENCOUNTER — Ambulatory Visit (INDEPENDENT_AMBULATORY_CARE_PROVIDER_SITE_OTHER): Payer: 59 | Admitting: Pharmacist

## 2019-05-08 ENCOUNTER — Other Ambulatory Visit: Payer: Self-pay

## 2019-05-08 DIAGNOSIS — Z79899 Other long term (current) drug therapy: Secondary | ICD-10-CM

## 2019-05-08 NOTE — Progress Notes (Signed)
   S: Patient presents to [facility] for review of their specialty medication therapy.  Patient is currently taking Humira for rheumatoid arthritis. Patient is managed by Dr. Trudie Reed for this.   Adherence: denies any missed doses except when she was waiting for a new prior authorization which delayed her getting her refill  Efficacy: reports that it is working well for her. She continues to have some flares but overall is happy with the Humira. She denies any adverse effects.   Dosing: Rheumatoid arthritis: SubQ: 40 mg every other week (may continue methotrexate, other nonbiologic DMARDS, corticosteroids, NSAIDs, and/or analgesics); patients not taking concomitant methotrexate may increase dose to 40 mg every week   Dose adjustments: Renal: no dose adjustments (has not been studied) Hepatic: no dose adjustments (has not been studied)  Screening: TB test: completed and was negative per patient Hepatitis: completed per patient  Monitoring: S/sx of infection: denies CBC: monitored q3 months and reports it is WNL S/sx of hypersensitivity: denies S/sx of malignancy: denies S/sx of heart failure: denies  O:     Lab Results  Component Value Date   WBC 7.3 12/18/2012   HGB 12.8 12/18/2012   HCT 40.0 12/18/2012   MCV 91.7 12/18/2012   PLT 269 12/18/2012      Chemistry      Component Value Date/Time   NA 139 12/18/2012 0925   K 4.0 12/18/2012 0925   CL 103 12/18/2012 0925   CO2 30 12/18/2012 0925   BUN 14 12/18/2012 0925   CREATININE 0.67 12/18/2012 0925      Component Value Date/Time   CALCIUM 9.3 12/18/2012 0925   ALKPHOS 47 06/29/2009 2026   AST 17 06/29/2009 2026   ALT 19 06/29/2009 2026   BILITOT 0.4 06/29/2009 2026       A/P: 1. Medication review: Patient currently on Humira for rheumatoid arthritis and continues to tolerate it well with no adverse effects. Reviewed the medication with the patient, including the following: Humira is a TNF blocking  agent indicated for ankylosing spondylitis, Crohn's disease, Hidradenitis suppurativa, psoriatic arthritis, plaque psoriasis, ulcerative colitis, and uveitis. Patient educated on purpose, proper use and potential adverse effects of Humira. The most common adverse effects are infections, headache, and injection site reactions. 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 There is the possibility of an increased risk of malignancy but it is not well understood if this increased risk is due to there medication or the disease state. There are rare cases of pancytopenia and aplastic anemia. No recommendations for any changes at this time.   Christella Hartigan, PharmD, BCPS, BCACP, CPP Clinical Pharmacist Practitioner  [phone]

## 2019-05-26 MED FILL — HUMIRA PEN 40 MG/0.4ML PNKT: 40 | 28 days supply | Qty: 2 | Fill #2

## 2019-06-02 MED FILL — AMLODIPINE BESYLATE 10 MG T: 10 | 90 days supply | Qty: 90 | Fill #0

## 2019-06-15 DIAGNOSIS — M0579 Rheumatoid arthritis with rheumatoid factor of multiple sites without organ or systems involvement: Secondary | ICD-10-CM | POA: Diagnosis not present

## 2019-06-15 DIAGNOSIS — I10 Essential (primary) hypertension: Secondary | ICD-10-CM | POA: Diagnosis not present

## 2019-06-15 DIAGNOSIS — M2550 Pain in unspecified joint: Secondary | ICD-10-CM | POA: Diagnosis not present

## 2019-06-15 DIAGNOSIS — Z79899 Other long term (current) drug therapy: Secondary | ICD-10-CM | POA: Diagnosis not present

## 2019-06-17 MED FILL — HUMIRA PEN 40 MG/0.4ML PNKT: 40 | 28 days supply | Qty: 2 | Fill #3

## 2019-06-25 MED FILL — CHLORTHALIDONE 25 MG TABLET: 25 | 30 days supply | Qty: 30 | Fill #0

## 2019-07-07 DIAGNOSIS — I10 Essential (primary) hypertension: Secondary | ICD-10-CM | POA: Diagnosis not present

## 2019-07-16 MED FILL — HUMIRA PEN 40 MG/0.4ML PNKT: 40 | 28 days supply | Qty: 2 | Fill #4

## 2019-07-21 DIAGNOSIS — I10 Essential (primary) hypertension: Secondary | ICD-10-CM | POA: Diagnosis not present

## 2019-07-25 MED FILL — CHLORTHALIDONE 25 MG TABLET: 25 | 30 days supply | Qty: 30 | Fill #1

## 2019-08-03 MED FILL — FOLIC ACID 1 MG TABS: 1 | 90 days supply | Qty: 180 | Fill #0

## 2019-08-10 ENCOUNTER — Other Ambulatory Visit: Payer: Self-pay

## 2019-08-10 ENCOUNTER — Encounter: Payer: Self-pay | Admitting: Obstetrics & Gynecology

## 2019-08-10 ENCOUNTER — Ambulatory Visit (INDEPENDENT_AMBULATORY_CARE_PROVIDER_SITE_OTHER): Payer: 59 | Admitting: Obstetrics & Gynecology

## 2019-08-10 VITALS — BP 155/76 | HR 91 | Ht 67.0 in | Wt 250.0 lb

## 2019-08-10 DIAGNOSIS — Z01419 Encounter for gynecological examination (general) (routine) without abnormal findings: Secondary | ICD-10-CM | POA: Diagnosis not present

## 2019-08-10 DIAGNOSIS — Z1151 Encounter for screening for human papillomavirus (HPV): Secondary | ICD-10-CM

## 2019-08-10 DIAGNOSIS — Z124 Encounter for screening for malignant neoplasm of cervix: Secondary | ICD-10-CM

## 2019-08-10 NOTE — Patient Instructions (Signed)

## 2019-08-10 NOTE — Progress Notes (Signed)
GYNECOLOGY ANNUAL PREVENTATIVE CARE ENCOUNTER NOTE  History:     [name] is a 58 y.o. P2 female here for a routine annual gynecologic exam.  Current complaints: none.   Reports some perimenopausal bleeding; has spotting some months and other months regular periods. Has skipped some months. No heavy bleeding or intermenstrual bleeding.  Denies abnormal vaginal discharge, pelvic pain, problems with intercourse or other gynecologic concerns.    Obstetric/Gynecologic History Patient's last menstrual period was 07/08/2019 (approximate).  Has two grown children. Contraception: none Last Pap: 10/16/2016. Results were: normal with negative HPV Last mammogram: 02/20/2019. Results were: normal  Past Medical History:  Diagnosis Date  . Arthritis    rheumatoid arthritis  . Asthma    as a child   . GERD (gastroesophageal reflux disease)   . Helicobacter pylori antibody positive 2006    Tx w/ PPI, amoxicillin and Biaxin  . Hemorrhoids   . Hypertension   . PVC's (premature ventricular contractions)    occasional R/O cardiac via [facility] yrs ago    Past Surgical History:  Procedure Laterality Date  . CERVICAL POLYPECTOMY  12/22/2012   Procedure: CERVICAL POLYPECTOMY;  Surgeon: Thurnell Lose, MD;  Location: [facility];  Service: Gynecology;  Laterality: N/A;  . CESAREAN SECTION     x 2  . HYSTEROSCOPY W/D&C  12/22/2012   Procedure: DILATATION AND CURETTAGE /HYSTEROSCOPY;  Surgeon: Thurnell Lose, MD;  Location: [facility];  Service: Gynecology;;  . PLANTAR'S WART EXCISION      Current Outpatient Medications on File Prior to Visit  Medication Sig Dispense Refill  . acetaminophen (TYLENOL) 325 MG tablet Take 650 mg by mouth as needed.    . Adalimumab (HUMIRA PEN) 40 MG/0.4ML PNKT Inject 1 pen into the skin every 14 (fourteen) days. (every other week) 2 each 5  . Ascorbic Acid (VITA-C PO) Take by mouth.    . calcium carbonate (TUMS - DOSED IN MG ELEMENTAL CALCIUM) 500 MG chewable tablet  Chew 1 tablet by mouth as needed.    . cetirizine (ZYRTEC) 10 MG tablet Take 10 mg by mouth daily.    . chlorthalidone (HYGROTON) 25 MG tablet Take 25 mg by mouth daily.    . Cholecalciferol (VITAMIN D) 2000 UNITS CAPS Take 1 capsule by mouth daily.    . folic acid (FOLVITE) 1 MG tablet Take 1 mg by mouth daily.    . meloxicam (MOBIC) 15 MG tablet Take 15 mg by mouth. 1/2 tablet by mouth every other day    . methotrexate (RHEUMATREX) 2.5 MG tablet Take 25 mg by mouth once a week. Takes on Sundays.  Caution:Chemotherapy. Protect from light.    . Multiple Vitamin (MULTIVITAMIN) tablet Take 1 tablet by mouth daily.    . shark liver oil-cocoa butter (PREPARATION H) 0.25-3-85.5 % suppository Place 1 suppository rectally as needed.    . hydrochlorothiazide (HYDRODIURIL) 25 MG tablet Take 25 mg by mouth daily.     No current facility-administered medications on file prior to visit.     No Known Allergies  Social History:  reports that she has never smoked. She has never used smokeless tobacco. She reports that she does not drink alcohol or use drugs.  Family History  Problem Relation Age of Onset  . Diabetes Brother        x 2   . Colon cancer Neg Hx    The following portions of the patient's history were reviewed and updated as appropriate: allergies, current medications, past family  history, past medical history, past social history, past surgical history and problem list.  Review of Systems Pertinent items noted in HPI and remainder of comprehensive ROS otherwise negative.  Physical Exam:  BP (!) 155/76   Pulse 91   Ht 5\' 7"  (1.702 m)   Wt 250 lb (113.4 kg)   LMP 07/08/2019 (Approximate)   BMI 39.16 kg/m  CONSTITUTIONAL: Well-developed, well-nourished female in no acute distress.  HENT:  Normocephalic, atraumatic, External right and left ear normal. Oropharynx is clear and moist EYES: Conjunctivae and EOM are normal. Pupils are equal, round, and reactive to light. No scleral  icterus.  NECK: Normal range of motion, supple, no masses.  Normal thyroid.  SKIN: Skin is warm and dry. No rash noted. Not diaphoretic. No erythema. No pallor. MUSCULOSKELETAL: Normal range of motion. No tenderness.  No cyanosis, clubbing, or edema.  2+ distal pulses. NEUROLOGIC: Alert and oriented to person, place, and time. Normal reflexes, muscle tone coordination. No cranial nerve deficit noted. PSYCHIATRIC: Normal mood and affect. Normal behavior. Normal judgment and thought content. CARDIOVASCULAR: Normal heart rate noted, regular rhythm RESPIRATORY: Clear to auscultation bilaterally. Effort and breath sounds normal, no problems with respiration noted. BREASTS: Symmetric in size. No masses, skin changes, nipple drainage, or lymphadenopathy. ABDOMEN: Soft, normal bowel sounds, no distention noted.  No tenderness, rebound or guarding.  PELVIC: Normal appearing external genitalia; normal appearing vaginal mucosa and cervix.  No abnormal discharge noted.  Pap smear obtained.  Unable to palpate uterus fully due to habitus. No other palpable masses, no uterine or adnexal tenderness.   Assessment and Plan:    1. Women's annual routine gynecological examination - Cytology - PAP Will follow up results of pap smear and manage accordingly. Mammogram is up to date. Routine preventative health maintenance measures emphasized. Please refer to After Visit Summary for other counseling recommendations.      Verita Schneiders, MD, [facility] [employer], [place]

## 2019-08-11 LAB — CYTOLOGY - PAP
Diagnosis: NEGATIVE
HPV: NOT DETECTED

## 2019-08-12 MED FILL — HUMIRA PEN 40 MG/0.4ML PNKT: 40 | 28 days supply | Qty: 2 | Fill #5

## 2019-08-14 DIAGNOSIS — L814 Other melanin hyperpigmentation: Secondary | ICD-10-CM | POA: Diagnosis not present

## 2019-08-14 DIAGNOSIS — D2261 Melanocytic nevi of right upper limb, including shoulder: Secondary | ICD-10-CM | POA: Diagnosis not present

## 2019-08-14 DIAGNOSIS — Z419 Encounter for procedure for purposes other than remedying health state, unspecified: Secondary | ICD-10-CM | POA: Diagnosis not present

## 2019-08-14 DIAGNOSIS — L821 Other seborrheic keratosis: Secondary | ICD-10-CM | POA: Diagnosis not present

## 2019-08-14 DIAGNOSIS — L918 Other hypertrophic disorders of the skin: Secondary | ICD-10-CM | POA: Diagnosis not present

## 2019-08-14 DIAGNOSIS — D1801 Hemangioma of skin and subcutaneous tissue: Secondary | ICD-10-CM | POA: Diagnosis not present

## 2019-08-14 DIAGNOSIS — L817 Pigmented purpuric dermatosis: Secondary | ICD-10-CM | POA: Diagnosis not present

## 2019-08-14 DIAGNOSIS — L2089 Other atopic dermatitis: Secondary | ICD-10-CM | POA: Diagnosis not present

## 2019-08-14 DIAGNOSIS — D2371 Other benign neoplasm of skin of right lower limb, including hip: Secondary | ICD-10-CM | POA: Diagnosis not present

## 2019-08-20 MED FILL — CHLORTHALIDONE 25 MG TABS: 25 | 90 days supply | Qty: 90 | Fill #2

## 2019-08-20 MED FILL — METHOTREXATE SODIUM 2.5 MG: 2.5 | 84 days supply | Qty: 96 | Fill #0

## 2019-08-21 MED FILL — TRIAMCINOLONE 0.1% CREAM: 0.1 | 25 days supply | Qty: 45 | Fill #0

## 2019-09-07 ENCOUNTER — Other Ambulatory Visit: Payer: Self-pay | Admitting: Internal Medicine

## 2019-09-07 ENCOUNTER — Other Ambulatory Visit: Payer: Self-pay | Admitting: Pharmacist

## 2019-09-07 MED ORDER — HUMIRA (2 PEN) 40 MG/0.4ML ~~LOC~~ AJKT: 1.0000 "pen " | AUTO-INJECTOR | SUBCUTANEOUS | 3 refills | Status: DC

## 2019-09-07 MED FILL — HUMIRA PEN 40 MG/0.4ML PNKT: 40 | 28 days supply | Qty: 2 | Fill #0

## 2019-09-09 MED FILL — MELOXICAM 15 MG TABLET: 15 | 90 days supply | Qty: 90 | Fill #1

## 2019-09-30 DIAGNOSIS — Z6841 Body Mass Index (BMI) 40.0 and over, adult: Secondary | ICD-10-CM | POA: Diagnosis not present

## 2019-09-30 DIAGNOSIS — M2550 Pain in unspecified joint: Secondary | ICD-10-CM | POA: Diagnosis not present

## 2019-09-30 DIAGNOSIS — Z79899 Other long term (current) drug therapy: Secondary | ICD-10-CM | POA: Diagnosis not present

## 2019-09-30 DIAGNOSIS — M0579 Rheumatoid arthritis with rheumatoid factor of multiple sites without organ or systems involvement: Secondary | ICD-10-CM | POA: Diagnosis not present

## 2019-10-12 MED FILL — HUMIRA PEN 40 MG/0.4ML PNKT: 40 | 28 days supply | Qty: 2 | Fill #1

## 2019-11-02 MED FILL — FOLIC ACID 1 MG TABS: 1 | 90 days supply | Qty: 180 | Fill #1

## 2019-11-03 DIAGNOSIS — H5712 Ocular pain, left eye: Secondary | ICD-10-CM | POA: Diagnosis not present

## 2019-11-05 MED FILL — METHOTREXATE SODIUM 2.5 MG: 2.5 | 84 days supply | Qty: 96 | Fill #0

## 2019-11-16 MED FILL — HUMIRA PEN 40 MG/0.4ML PNKT: 40 | 28 days supply | Qty: 2 | Fill #2

## 2019-11-23 MED FILL — CHLORTHALIDONE 25 MG TABS: 25 | 90 days supply | Qty: 90 | Fill #3

## 2019-12-02 DIAGNOSIS — R5382 Chronic fatigue, unspecified: Secondary | ICD-10-CM | POA: Diagnosis not present

## 2019-12-02 DIAGNOSIS — Z0000 Encounter for general adult medical examination without abnormal findings: Secondary | ICD-10-CM | POA: Diagnosis not present

## 2019-12-02 DIAGNOSIS — I10 Essential (primary) hypertension: Secondary | ICD-10-CM | POA: Diagnosis not present

## 2019-12-02 DIAGNOSIS — E559 Vitamin D deficiency, unspecified: Secondary | ICD-10-CM | POA: Diagnosis not present

## 2019-12-02 DIAGNOSIS — E785 Hyperlipidemia, unspecified: Secondary | ICD-10-CM | POA: Diagnosis not present

## 2019-12-04 MED FILL — PROPRANOLOL 20 MG TABLET: 20 | 30 days supply | Qty: 30 | Fill #0

## 2019-12-16 DIAGNOSIS — H524 Presbyopia: Secondary | ICD-10-CM | POA: Diagnosis not present

## 2019-12-16 DIAGNOSIS — H5213 Myopia, bilateral: Secondary | ICD-10-CM | POA: Diagnosis not present

## 2019-12-16 DIAGNOSIS — H52203 Unspecified astigmatism, bilateral: Secondary | ICD-10-CM | POA: Diagnosis not present

## 2019-12-21 MED FILL — HUMIRA PEN 40 MG/0.4ML PNKT: 40 | 28 days supply | Qty: 2 | Fill #3

## 2019-12-31 MED FILL — LISINOPRIL 20 MG TABLET: 20 | 90 days supply | Qty: 45 | Fill #0

## 2020-01-05 DIAGNOSIS — Z79899 Other long term (current) drug therapy: Secondary | ICD-10-CM | POA: Diagnosis not present

## 2020-01-05 DIAGNOSIS — M0579 Rheumatoid arthritis with rheumatoid factor of multiple sites without organ or systems involvement: Secondary | ICD-10-CM | POA: Diagnosis not present

## 2020-01-14 DIAGNOSIS — R5382 Chronic fatigue, unspecified: Secondary | ICD-10-CM | POA: Diagnosis not present

## 2020-01-14 DIAGNOSIS — I10 Essential (primary) hypertension: Secondary | ICD-10-CM | POA: Diagnosis not present

## 2020-01-14 DIAGNOSIS — E559 Vitamin D deficiency, unspecified: Secondary | ICD-10-CM | POA: Diagnosis not present

## 2020-01-14 DIAGNOSIS — Z713 Dietary counseling and surveillance: Secondary | ICD-10-CM | POA: Diagnosis not present

## 2020-01-18 ENCOUNTER — Other Ambulatory Visit: Payer: Self-pay | Admitting: Pharmacist

## 2020-01-18 ENCOUNTER — Other Ambulatory Visit: Payer: Self-pay | Admitting: Internal Medicine

## 2020-01-18 MED ORDER — HUMIRA (2 PEN) 40 MG/0.4ML ~~LOC~~ AJKT: 1.0000 "pen " | AUTO-INJECTOR | SUBCUTANEOUS | 3 refills | Status: DC

## 2020-01-21 MED FILL — HUMIRA PEN 40 MG/0.4ML PNKT: 40 | 28 days supply | Qty: 2 | Fill #0

## 2020-01-28 MED FILL — METHOTREXATE SODIUM 2.5 MG: 2.5 | 28 days supply | Qty: 32 | Fill #0

## 2020-01-29 MED FILL — FOLIC ACID 1 MG TABS: 1 | 90 days supply | Qty: 180 | Fill #0

## 2020-02-16 MED FILL — CHLORTHALIDONE 25 MG TABS: 25 | 90 days supply | Qty: 90 | Fill #4

## 2020-02-16 MED FILL — MELOXICAM 15 MG TABLET: 15 | 90 days supply | Qty: 90 | Fill #0

## 2020-02-17 MED FILL — HUMIRA PEN 40 MG/0.4ML PNKT: 40 | 28 days supply | Qty: 2 | Fill #1

## 2020-02-29 ENCOUNTER — Other Ambulatory Visit: Payer: Self-pay | Admitting: Obstetrics & Gynecology

## 2020-02-29 DIAGNOSIS — Z1231 Encounter for screening mammogram for malignant neoplasm of breast: Secondary | ICD-10-CM

## 2020-03-02 MED FILL — METHOTREXATE SODIUM 2.5 MG: 2.5 | 28 days supply | Qty: 32 | Fill #0

## 2020-03-08 ENCOUNTER — Ambulatory Visit
Admission: RE | Admit: 2020-03-08 | Discharge: 2020-03-08 | Disposition: A | Discharge status: Home / Self Care | Payer: 59 | Source: Ambulatory Visit

## 2020-03-08 ENCOUNTER — Other Ambulatory Visit: Payer: Self-pay

## 2020-03-08 DIAGNOSIS — Z1231 Encounter for screening mammogram for malignant neoplasm of breast: Secondary | ICD-10-CM | POA: Diagnosis not present

## 2020-03-15 MED FILL — HUMIRA PEN 40 MG/0.4ML PNKT: 40 | 28 days supply | Qty: 2 | Fill #2

## 2020-03-29 MED FILL — METHOTREXATE SODIUM 2.5 MG: 2.5 | 28 days supply | Qty: 32 | Fill #1

## 2020-03-29 MED FILL — LISINOPRIL 20 MG TABLET: 20 | 90 days supply | Qty: 45 | Fill #1

## 2020-04-05 ENCOUNTER — Encounter: Payer: Self-pay | Admitting: Internal Medicine

## 2020-04-05 DIAGNOSIS — Z79899 Other long term (current) drug therapy: Secondary | ICD-10-CM | POA: Diagnosis not present

## 2020-04-05 DIAGNOSIS — M0579 Rheumatoid arthritis with rheumatoid factor of multiple sites without organ or systems involvement: Secondary | ICD-10-CM | POA: Diagnosis not present

## 2020-04-05 DIAGNOSIS — Z6839 Body mass index (BMI) 39.0-39.9, adult: Secondary | ICD-10-CM | POA: Diagnosis not present

## 2020-04-05 DIAGNOSIS — M2550 Pain in unspecified joint: Secondary | ICD-10-CM | POA: Diagnosis not present

## 2020-04-05 DIAGNOSIS — E669 Obesity, unspecified: Secondary | ICD-10-CM | POA: Diagnosis not present

## 2020-04-12 MED FILL — HUMIRA PEN 40 MG/0.4ML PNKT: 40 | 28 days supply | Qty: 2 | Fill #3

## 2020-04-25 ENCOUNTER — Other Ambulatory Visit: Payer: Self-pay | Admitting: Obstetrics & Gynecology

## 2020-04-25 ENCOUNTER — Other Ambulatory Visit: Payer: 59

## 2020-04-25 DIAGNOSIS — D899 Disorder involving the immune mechanism, unspecified: Secondary | ICD-10-CM | POA: Diagnosis not present

## 2020-04-25 DIAGNOSIS — Z79899 Other long term (current) drug therapy: Secondary | ICD-10-CM | POA: Diagnosis not present

## 2020-04-26 LAB — SARS-COV-2 ANTIBODY, IGM: SARS-CoV-2 Antibody, IgM: NEGATIVE

## 2020-04-27 ENCOUNTER — Other Ambulatory Visit: Payer: Self-pay

## 2020-04-27 ENCOUNTER — Other Ambulatory Visit: Payer: Self-pay | Admitting: General Practice

## 2020-04-27 DIAGNOSIS — Z79899 Other long term (current) drug therapy: Secondary | ICD-10-CM

## 2020-04-27 MED FILL — FOLIC ACID 1 MG TABS: 1 | 90 days supply | Qty: 180 | Fill #0

## 2020-04-27 MED FILL — METHOTREXATE 2.5 MG TAB: 2.5 | 84 days supply | Qty: 96 | Fill #0

## 2020-04-28 ENCOUNTER — Other Ambulatory Visit: Payer: Self-pay | Admitting: Obstetrics & Gynecology

## 2020-04-28 ENCOUNTER — Other Ambulatory Visit: Payer: 59

## 2020-04-28 ENCOUNTER — Other Ambulatory Visit: Payer: Self-pay

## 2020-04-28 DIAGNOSIS — Z0184 Encounter for antibody response examination: Secondary | ICD-10-CM

## 2020-05-04 ENCOUNTER — Other Ambulatory Visit: Payer: Self-pay

## 2020-05-04 ENCOUNTER — Ambulatory Visit (HOSPITAL_BASED_OUTPATIENT_CLINIC_OR_DEPARTMENT_OTHER): Payer: 59 | Admitting: Pharmacist

## 2020-05-04 DIAGNOSIS — Z79899 Other long term (current) drug therapy: Secondary | ICD-10-CM

## 2020-05-04 MED ORDER — HUMIRA (2 PEN) 40 MG/0.4ML ~~LOC~~ AJKT: 1.0000 "pen " | AUTO-INJECTOR | SUBCUTANEOUS | 2 refills | Status: DC

## 2020-05-04 NOTE — Progress Notes (Signed)
   S: Patient presents to [facility] for review of their specialty medication therapy.  Patient is currently taking Humira for rheumatoid arthritis. Patient is managed by Dr. Trudie Reed for this.   Adherence: denies any missed doses  Efficacy: reports that it is working well for her. She continues to have some flares but overall is happy with the Humira. She is considering with her specialist whether or not to increase to 40 mg [place] weekly. She denies any adverse effects.   Dosing: Rheumatoid arthritis: SubQ: 40 mg every other week (may continue methotrexate, other nonbiologic DMARDS, corticosteroids, NSAIDs, and/or analgesics); patients not taking concomitant methotrexate may increase dose to 40 mg every week  Dose adjustments: Renal: no dose adjustments (has not been studied) Hepatic: no dose adjustments (has not been studied)  Screening: TB test: completed and was negative per patient Hepatitis: completed per patient  Monitoring: S/sx of infection: denies CBC: monitored q3 months and reports it is WNL S/sx of hypersensitivity: denies S/sx of malignancy: denies S/sx of heart failure: denies  O:     Lab Results  Component Value Date   WBC 7.3 12/18/2012   HGB 12.8 12/18/2012   HCT 40.0 12/18/2012   MCV 91.7 12/18/2012   PLT 269 12/18/2012      Chemistry      Component Value Date/Time   NA 139 12/18/2012 0925   K 4.0 12/18/2012 0925   CL 103 12/18/2012 0925   CO2 30 12/18/2012 0925   BUN 14 12/18/2012 0925   CREATININE 0.67 12/18/2012 0925      Component Value Date/Time   CALCIUM 9.3 12/18/2012 0925   ALKPHOS 47 06/29/2009 2026   AST 17 06/29/2009 2026   ALT 19 06/29/2009 2026   BILITOT 0.4 06/29/2009 2026       A/P: 1. Medication review: Patient currently on Humira for rheumatoid arthritis and continues to tolerate it well with no adverse effects. Reviewed the medication with the patient, including the following: Humira is a TNF blocking agent indicated  for ankylosing spondylitis, Crohn's disease, Hidradenitis suppurativa, psoriatic arthritis, plaque psoriasis, ulcerative colitis, and uveitis. Patient educated on purpose, proper use and potential adverse effects of Humira. The most common adverse effects are infections, headache, and injection site reactions. 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 There is the possibility of an increased risk of malignancy but it is not well understood if this increased risk is due to there medication or the disease state. There are rare cases of pancytopenia and aplastic anemia. No recommendations for any changes at this time.  Benard Halsted, PharmD, [facility] [phone]

## 2020-05-10 MED FILL — HUMIRA PEN 40 MG/0.4ML PNKT: 40 | 28 days supply | Qty: 2 | Fill #0

## 2020-05-16 ENCOUNTER — Other Ambulatory Visit: Payer: Self-pay

## 2020-05-16 ENCOUNTER — Other Ambulatory Visit: Payer: 59

## 2020-05-16 DIAGNOSIS — Z0184 Encounter for antibody response examination: Secondary | ICD-10-CM | POA: Diagnosis not present

## 2020-05-16 DIAGNOSIS — Z79899 Other long term (current) drug therapy: Secondary | ICD-10-CM | POA: Diagnosis not present

## 2020-05-17 LAB — SAR COV2 SEROLOGY (COVID19)AB(IGG),IA: DiaSorin SARS-CoV-2 Ab, IgG: POSITIVE

## 2020-05-19 LAB — SPECIMEN STATUS REPORT

## 2020-05-19 LAB — SARS-COV-2 SEMI-QUANTITATIVE TOTAL ANTIBODY, SPIKE: SARS-CoV-2 Semi-Quant Total Ab: 177.3 U/mL (ref <0.8)

## 2020-05-20 MED FILL — CHLORTHALIDONE 25 MG TABS: 25 | 30 days supply | Qty: 30 | Fill #5

## 2020-05-25 ENCOUNTER — Ambulatory Visit: Payer: 59 | Admitting: Internal Medicine

## 2020-05-31 ENCOUNTER — Encounter: Payer: Self-pay | Admitting: Internal Medicine

## 2020-05-31 ENCOUNTER — Ambulatory Visit: Payer: 59 | Admitting: Internal Medicine

## 2020-05-31 VITALS — BP 138/68 | HR 110 | Ht 67.0 in | Wt 250.0 lb

## 2020-05-31 DIAGNOSIS — R1310 Dysphagia, unspecified: Secondary | ICD-10-CM

## 2020-05-31 DIAGNOSIS — K625 Hemorrhage of anus and rectum: Secondary | ICD-10-CM | POA: Diagnosis not present

## 2020-05-31 DIAGNOSIS — R1319 Other dysphagia: Secondary | ICD-10-CM

## 2020-05-31 DIAGNOSIS — K602 Anal fissure, unspecified: Secondary | ICD-10-CM

## 2020-05-31 DIAGNOSIS — Z6839 Body mass index (BMI) 39.0-39.9, adult: Secondary | ICD-10-CM | POA: Diagnosis not present

## 2020-05-31 MED ORDER — DILTIAZEM GEL 2 %: CUTANEOUS | 2 refills | Status: DC

## 2020-05-31 NOTE — Progress Notes (Signed)
[name] 59 y.o. 11-13-1961 [id number]  Assessment & Plan:   Encounter Diagnoses  Name Primary?  . Esophageal dysphagia Yes  . Anal fissure   . Rectal bleeding   . BMI 39.0-39.9,adult     Regarding the dysphagia and EGD is appropriate.  I think her bleeding is from her anal fissure plus or minus some hemorrhoids.  She could need a colonoscopy but is not clear to me at this point.  Our plan is to treat the anal fissure with diltiazem/lidocaine 2% / 5% cream.  She will return in about 6 weeks for reassessment.  If dysphagia worsens in the interim she is to call but otherwise we will hold off on performing an endoscopy now as she could need a colonoscopy also.  We will make the determination regarding procedures depending upon the response to treatment of the fissure.  She has had some minor bowel movement consistency changes but I do not think they represent alarm features but will factor that into our decision when she returns.  We briefly discussed obesity and body mass index and the rationale for using a low carbohydrate diet I have referred her to the diet doctor website to start gaining knowledge and hopefully putting some of the low-carb diet practices into place.  I appreciate the opportunity to care for this patient. 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  She is also having a bit of pelvic pressure and plans to see gynecology and is noted some episodic dysphagia over the past few months about 3 times a month with solid food dysphagia.  A drink of water relieves the dysphagia.  There is discomfort with passage of stool.  Sort  of a sharp pain but not disabling.  Mild heartburn at times.  She will use an over-the-counter H2 blocker.  No unintentional weight loss.  Since she takes immunosuppressants for her rheumatoid arthritis she wonders about the potential side effects from those being a cause of these problems.  She does not have any bladder symptoms.  She looks on her chart access for rheumatology and she has labs every 3 months with Dr. Lenna Gilford and her hemoglobin was 13.9 in April. No Known Allergies Current Meds  Medication Sig  . acetaminophen (TYLENOL) 325 MG tablet Take 650 mg by mouth as needed.  . Adalimumab (HUMIRA PEN) 40 MG/0.4ML PNKT Inject 1 pen into the skin every 14 (fourteen) days. (every other week)  . Ascorbic Acid (VITA-C PO) Take by mouth.  . calcium carbonate (TUMS - DOSED IN MG ELEMENTAL CALCIUM) 500 MG chewable tablet Chew 1 tablet by mouth as needed.  . cetirizine (ZYRTEC) 10 MG tablet Take 10 mg by mouth daily.  . chlorthalidone (HYGROTON) 25 MG tablet Take 25 mg by mouth daily.  . Cholecalciferol (VITAMIN D) 2000 UNITS CAPS Take 1 capsule by mouth daily.  . folic acid (FOLVITE) 1 MG tablet Take 2 mg by mouth daily.   . hydrochlorothiazide (HYDRODIURIL) 25 MG tablet Take 25 mg by mouth daily.  . meloxicam (MOBIC) 15 MG tablet Take 7.5 mg by mouth daily.   . methotrexate (RHEUMATREX) 2.5 MG tablet Take 20 mg by  mouth once a week. Takes on Sundays.  Caution:Chemotherapy. Protect from light.   . Multiple Vitamin (MULTIVITAMIN) tablet Take 1 tablet by mouth daily.  . propranolol (INDERAL) 20 MG tablet Take 10 mg by mouth daily.  . shark liver oil-cocoa butter (PREPARATION H) 0.25-3-85.5 % suppository Place 1 suppository rectally as needed.   Past Medical History:  Diagnosis Date  . Arthritis    rheumatoid arthritis  . Asthma    as a child   . GERD (gastroesophageal reflux disease)   . Helicobacter pylori antibody positive 2006    Tx w/ PPI, amoxicillin and Biaxin  . Hemorrhoids   .  Hypertension   . Obesity   . PVC's (premature ventricular contractions)    occasional R/O cardiac via [facility] yrs ago   Past Surgical History:  Procedure Laterality Date  . CERVICAL POLYPECTOMY  12/22/2012   Procedure: CERVICAL POLYPECTOMY;  Surgeon: Thurnell Lose, MD;  Location: [facility];  Service: Gynecology;  Laterality: N/A;  . CESAREAN SECTION     x 2  . COLONOSCOPY    . HYSTEROSCOPY WITH D & C  12/22/2012   Procedure: DILATATION AND CURETTAGE /HYSTEROSCOPY;  Surgeon: Thurnell Lose, MD;  Location: [facility];  Service: Gynecology;;  . 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   Review of Systems As per HPI some insomnia all other review of systems are negative  Objective:   Physical Exam BP 138/68   Pulse (!) 110   Ht 5\' 7"  (1.702 m)   Wt 250 lb (113.4 kg)   BMI 39.16 kg/m  Obese NAD Anicteric Lungs cta Cor NL abd obese soft NT no HSM/mass Alert and oriented x3 Appropriate mood and affect

## 2020-05-31 NOTE — Patient Instructions (Addendum)
If you are age 57 or older, your body mass index should be between 23-30. Your Body mass index is 39.16 kg/m. If this is out of the aforementioned range listed, please consider follow up with your Primary Care Provider.  If you are age 82 or younger, your body mass index should be between 19-25. Your Body mass index is 39.16 kg/m. If this is out of the aformentioned range listed, please consider follow up with your Primary Care Provider.   We have sent a prescription for Diltiazem 2% gel to [facility] for you. Using your index finger, you should apply a small amount of medication inside the rectum up to your first knuckle/joint twice daily x 4 weeks.  [facility]'s information is below: Address: [address], [place], [place] [address]  [phone]  *Please DO NOT go directly from our office to pick up this medication! Give the pharmacy 1 day to process the prescription as this is compounded and takes time to make.  [email or web address] for low carb diet information.   Follow up July 27, 2020 @ 9:30 am.

## 2020-06-03 LAB — SARS-COV-2 SEMI-QUANTITATIVE TOTAL ANTIBODY, SPIKE: SARS-CoV-2 Semi-Quant Total Ab: 105.8 U/mL (ref <0.8)

## 2020-06-03 LAB — SPECIMEN STATUS REPORT

## 2020-06-06 MED FILL — HUMIRA PEN 40 MG/0.4ML PNKT: 40 | 28 days supply | Qty: 2 | Fill #1

## 2020-06-13 ENCOUNTER — Other Ambulatory Visit: Payer: Self-pay

## 2020-06-13 ENCOUNTER — Ambulatory Visit (INDEPENDENT_AMBULATORY_CARE_PROVIDER_SITE_OTHER): Payer: 59

## 2020-06-13 DIAGNOSIS — R351 Nocturia: Secondary | ICD-10-CM

## 2020-06-13 DIAGNOSIS — R3915 Urgency of urination: Secondary | ICD-10-CM | POA: Diagnosis not present

## 2020-06-13 DIAGNOSIS — R102 Pelvic and perineal pain: Secondary | ICD-10-CM | POA: Diagnosis not present

## 2020-06-13 NOTE — Progress Notes (Signed)
Patient was assessed and managed by nursing staff during this encounter. I have reviewed the chart and agree with the documentation and plan. I have also made any necessary editorial changes.  Verita Schneiders, MD 06/13/2020 4:32 PM

## 2020-06-13 NOTE — Progress Notes (Signed)
Pt reported pelvic pressure and nocturia to Anyanwu, MD who recommends POC urinalysis and urine culture. Pt denies any other symptoms. Urine specimen obtained. UA performed. Urine culture ordered. Will follow up with results.   Apolonio Schneiders RN 06/13/20

## 2020-06-14 LAB — URINE CULTURE

## 2020-06-15 MED FILL — CHLORTHALIDONE 25 MG TABS: 25 | 90 days supply | Qty: 90 | Fill #0

## 2020-06-28 MED FILL — LISINOPRIL 20 MG TABLET: 20 | 90 days supply | Qty: 45 | Fill #2

## 2020-07-05 DIAGNOSIS — M0579 Rheumatoid arthritis with rheumatoid factor of multiple sites without organ or systems involvement: Secondary | ICD-10-CM | POA: Diagnosis not present

## 2020-07-06 MED FILL — HUMIRA PEN 40 MG/0.4ML PNKT: 40 | 28 days supply | Qty: 2 | Fill #2

## 2020-07-24 MED FILL — METHOTREXATE 2.5 MG TAB: 2.5 | 84 days supply | Qty: 96 | Fill #1

## 2020-07-25 MED FILL — FOLIC ACID 1 MG TABS: 1 | 90 days supply | Qty: 180 | Fill #1

## 2020-07-27 ENCOUNTER — Encounter: Payer: Self-pay | Admitting: Internal Medicine

## 2020-07-27 ENCOUNTER — Ambulatory Visit: Payer: 59 | Admitting: Internal Medicine

## 2020-07-27 VITALS — BP 130/64 | HR 72 | Ht 66.75 in | Wt 247.0 lb

## 2020-07-27 DIAGNOSIS — R194 Change in bowel habit: Secondary | ICD-10-CM | POA: Diagnosis not present

## 2020-07-27 DIAGNOSIS — K625 Hemorrhage of anus and rectum: Secondary | ICD-10-CM

## 2020-07-27 DIAGNOSIS — R102 Pelvic and perineal pain: Secondary | ICD-10-CM | POA: Diagnosis not present

## 2020-07-27 DIAGNOSIS — K602 Anal fissure, unspecified: Secondary | ICD-10-CM | POA: Diagnosis not present

## 2020-07-27 DIAGNOSIS — R1319 Other dysphagia: Secondary | ICD-10-CM

## 2020-07-27 DIAGNOSIS — R1310 Dysphagia, unspecified: Secondary | ICD-10-CM

## 2020-07-27 NOTE — Progress Notes (Signed)
[name] 59 y.o. [birth year] [id number]  Assessment & Plan:   Encounter Diagnoses  Name Primary?  . Esophageal dysphagia Yes  . Change in bowel habits   . Rectal bleeding   . Anal fissure   . Pelvic pressure in female     Go ahead with upper GI endoscopy to evaluate and treat esophageal dysphagia. We went back and forth about the need for a colonoscopy and I think there is some indication for it though my index of suspicion of something serious is low we have decided to proceed to evaluate her change in bowel habits make sure the fissure was the only cause of rectal bleeding and reassure.  Agree with pelvic exam as well.  She does not recall being told she has a cystocele or bladder prolapse or anything like that but certainly possible.  The risks and benefits as well as alternatives of endoscopic procedure(s) have been discussed and reviewed. All questions answered. The patient agrees to proceed.  I appreciate the opportunity to care for this patient. CC: Beverly Small, MD    Subjective:   Chief Complaint: Dysphagia, anal fissure follow-up with change in bowel habits  HPI [name] returns for follow-up, I diagnosed her with an anal fissure in early June rectal bleeding and pain and she was also having incomplete defecation and change in bowel habits.  She has been using diltiazem mixed with lidocaine with good relief when she went on vacation she stopped it she started having more pain.  It sounds like the bleeding is resolved or almost resolved.  She remains concerned about these changes in bowel habits.  She still has episodic solid food dysphagia that is stable.  The plan was to reassess and determine if a colonoscopy as well as a necessary upper endoscopy was indicated.  She also has pelvic pressure without urinary incontinence and is due to see gynecology for an exam (annual).  Wt Readings from Last 3 Encounters:  07/27/20 247 lb (112 kg)  05/31/20 250 lb (113.4 kg)    08/10/19 250 lb (113.4 kg)    No Known Allergies Current Meds  Medication Sig  . acetaminophen (TYLENOL) 325 MG tablet Take 650 mg by mouth as needed.  . Adalimumab (HUMIRA PEN) 40 MG/0.4ML PNKT Inject 1 pen into the skin every 14 (fourteen) days. (every other week)  . Ascorbic Acid (VITA-C PO) Take by mouth.  . calcium carbonate (TUMS - DOSED IN MG ELEMENTAL CALCIUM) 500 MG chewable tablet Chew 1 tablet by mouth as needed.  . cetirizine (ZYRTEC) 10 MG tablet Take 10 mg by mouth daily.  . chlorthalidone (HYGROTON) 25 MG tablet Take 25 mg by mouth daily.  . Cholecalciferol (VITAMIN D) 2000 UNITS CAPS Take 1 capsule by mouth daily.  Marland Kitchen diltiazem 2 % GEL Diltiazem 2%:lidocaine 5%. Using your index finger, apply a Cherry amount of medication inside the rectum up to your first knuckle/joint twice daily x 4 weeks.  . folic acid (FOLVITE) 1 MG tablet Take 2 mg by mouth daily.   . meloxicam (MOBIC) 15 MG tablet Take 7.5 mg by mouth daily.   . methotrexate (RHEUMATREX) 2.5 MG tablet Take 20 mg by mouth once a week. Takes on Sundays.  Caution:Chemotherapy. Protect from light.   . Multiple Vitamin (MULTIVITAMIN) tablet Take 1 tablet by mouth daily.  . propranolol (INDERAL) 20 MG tablet Take 10 mg by mouth daily.  . shark liver oil-cocoa butter (PREPARATION H) 0.25-3-85.5 % suppository Place 1 suppository rectally as  needed.   Past Medical History:  Diagnosis Date  . Anal fissure   . Arthritis    rheumatoid arthritis  . Asthma    as a child   . GERD (gastroesophageal reflux disease)   . Helicobacter pylori antibody positive 2006    Tx w/ PPI, amoxicillin and Biaxin  . Hemorrhoids   . Hyperlipidemia   . Hypertension   . Obesity   . PVC's (premature ventricular contractions)    occasional R/O cardiac via [facility] yrs ago  . Rheumatoid arthritis [facility])    Past Surgical History:  Procedure Laterality Date  . CERVICAL POLYPECTOMY  12/22/2012   Procedure: CERVICAL POLYPECTOMY;  Surgeon: Thurnell Lose, MD;  Location: [facility];  Service: Gynecology;  Laterality: N/A;  . CESAREAN SECTION     x 2  . COLONOSCOPY    . HYSTEROSCOPY WITH D & C  12/22/2012   Procedure: DILATATION AND CURETTAGE /HYSTEROSCOPY;  Surgeon: Thurnell Lose, MD;  Location: [facility];  Service: Gynecology;;  . 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   Review of Systems As above  Objective:   Physical Exam BP 130/64   Pulse 72   Ht 5' 6.75" (1.695 m)   Wt 247 lb (112 kg)   BMI 38.98 kg/m  No acute distress

## 2020-07-27 NOTE — Patient Instructions (Signed)
You have been scheduled for an endoscopy and colonoscopy. Please follow the written instructions given to you at your visit today. Please pick up your prep supplies at the pharmacy within the next 1-3 days. If you use inhalers (even only as needed), please bring them with you on the day of your procedure.  I appreciate the opportunity to care for you. Carl Gessner, MD, FACG 

## 2020-08-02 ENCOUNTER — Other Ambulatory Visit: Payer: Self-pay | Admitting: Pharmacist

## 2020-08-02 MED ORDER — HUMIRA (2 PEN) 40 MG/0.4ML ~~LOC~~ AJKT: 1.0000 "pen " | AUTO-INJECTOR | SUBCUTANEOUS | 5 refills | Status: DC

## 2020-08-02 MED FILL — HUMIRA PEN 40 MG/0.4ML PNKT: 40 | 28 days supply | Qty: 2 | Fill #0

## 2020-08-04 ENCOUNTER — Encounter: Payer: Self-pay | Admitting: Certified Registered Nurse Anesthetist

## 2020-08-05 ENCOUNTER — Encounter: Payer: Self-pay | Admitting: Internal Medicine

## 2020-08-05 ENCOUNTER — Ambulatory Visit (AMBULATORY_SURGERY_CENTER): Payer: 59 | Admitting: Internal Medicine

## 2020-08-05 ENCOUNTER — Other Ambulatory Visit: Payer: Self-pay

## 2020-08-05 VITALS — BP 130/62 | HR 82 | Temp 97.8°F | Resp 17 | Ht 67.0 in | Wt 250.0 lb

## 2020-08-05 DIAGNOSIS — R1310 Dysphagia, unspecified: Secondary | ICD-10-CM

## 2020-08-05 DIAGNOSIS — K625 Hemorrhage of anus and rectum: Secondary | ICD-10-CM | POA: Diagnosis not present

## 2020-08-05 DIAGNOSIS — K5730 Diverticulosis of large intestine without perforation or abscess without bleeding: Secondary | ICD-10-CM | POA: Diagnosis not present

## 2020-08-05 DIAGNOSIS — K2100 Gastro-esophageal reflux disease with esophagitis, without bleeding: Secondary | ICD-10-CM

## 2020-08-05 DIAGNOSIS — K2210 Ulcer of esophagus without bleeding: Secondary | ICD-10-CM | POA: Diagnosis not present

## 2020-08-05 DIAGNOSIS — R194 Change in bowel habit: Secondary | ICD-10-CM

## 2020-08-05 DIAGNOSIS — I10 Essential (primary) hypertension: Secondary | ICD-10-CM | POA: Diagnosis not present

## 2020-08-05 DIAGNOSIS — J45909 Unspecified asthma, uncomplicated: Secondary | ICD-10-CM | POA: Diagnosis not present

## 2020-08-05 HISTORY — PX: OTHER SURGICAL HISTORY: SHX169

## 2020-08-05 MED ORDER — PANTOPRAZOLE SODIUM 20 MG PO TBEC: 20.0000 mg | DELAYED_RELEASE_TABLET | Freq: Every day | ORAL | 3 refills | Status: DC

## 2020-08-05 MED ORDER — SODIUM CHLORIDE 0.9 % IV SOLN: 500.0000 mL | Freq: Once | INTRAVENOUS | Status: DC

## 2020-08-05 MED FILL — PANTOPRAZOLE SOD DR 20 MG T: 20 | 90 days supply | Qty: 90 | Fill #0

## 2020-08-05 NOTE — Progress Notes (Signed)
Pt's states no medical or surgical changes since previsit or office visit. 

## 2020-08-05 NOTE — Progress Notes (Signed)
Called to room to assist during endoscopic procedure.  Patient ID and intended procedure confirmed with present staff. Received instructions for my participation in the procedure from the performing physician.  

## 2020-08-05 NOTE — Op Note (Signed)
[facility] Patient Name: [name] Procedure Date: 08/05/2020 3:23 PM MRN: [id number] Endoscopist: Gatha Mayer , MD Age: 59 Referring MD:  Date of Birth: [birth year] Gender: Female Account #: [id number] Procedure:                Upper GI endoscopy Indications:              Dysphagia Medicines:                Propofol per Anesthesia, Monitored Anesthesia Care Procedure:                Pre-Anesthesia Assessment:                           - Prior to the procedure, a History and Physical                            was performed, and patient medications and                            allergies were reviewed. The patient's tolerance of                            previous anesthesia was also reviewed. The risks                            and benefits of the procedure and the sedation                            options and risks were discussed with the patient.                            All questions were answered, and informed consent                            was obtained. Prior Anticoagulants: The patient has                            taken no previous anticoagulant or antiplatelet                            agents. ASA Grade Assessment: II - A patient with                            mild systemic disease. After reviewing the risks                            and benefits, the patient was deemed in                            satisfactory condition to undergo the procedure.                           After obtaining informed consent, the endoscope was  passed under direct vision. Throughout the                            procedure, the patient's blood pressure, pulse, and                            oxygen saturations were monitored continuously. The                            Endoscope was introduced through the mouth, and                            advanced to the second part of duodenum. The upper                            GI endoscopy was  accomplished without difficulty.                            The patient tolerated the procedure well. Scope In: Scope Out: Findings:                 LA Grade A (one or more mucosal breaks less than 5                            mm, not extending between tops of 2 mucosal folds)                            esophagitis with no bleeding was found in the                            distal esophagus. Biopsies were taken with a cold                            forceps for histology. Verification of patient                            identification for the specimen was done.                           The entire examined stomach was normal.                           The examined duodenum was normal.                           The cardia and gastric fundus were normal on                            retroflexion.                           The scope was withdrawn. Dilation was performed in  the entire esophagus with a Maloney dilator with                            mild resistance at 54 Fr. Estimated blood loss:                            none. Complications:            No immediate complications. Estimated Blood Loss:     Estimated blood loss was minimal. Impression:               - LA Grade A reflux esophagitis with no bleeding.                            Biopsied.                           - Normal stomach.                           - Normal examined duodenum.                           - Dilation performed in the entire esophagus. Recommendation:           - Patient has a contact number available for                            emergencies. The signs and symptoms of potential                            delayed complications were discussed with the                            patient. Return to normal activities tomorrow.                            Written discharge instructions were provided to the                            patient.                           - Clear liquids  x 1 hour then soft foods rest of                            day. Start prior diet tomorrow.                           - Continue present medications.                           - Follow an antireflux regimen.                           - Use Protonix (pantoprazole) 20 mg PO daily. Gatha Mayer, MD 08/05/2020 4:07:05 PM This report has been signed  electronically.

## 2020-08-05 NOTE — Progress Notes (Signed)
Robinul 0.1 mg IV given due large amount of secretions upon assessment.  MD made aware, vss 

## 2020-08-05 NOTE — Patient Instructions (Addendum)
I found some reflux esophagitis - biopsied to be certain. Also dilated the esophagus though did not see a stricture.  Slight diverticulosis in colon.  No polyps or cancer.  Starting pantoprazole 20 mg daily for reflux.  Will let you know about the biopsies.  Try to eat low carb diet with good amounts of protein. Check out dietdoctor.com for good info on this. Weight loss could alleviate GERD and get off meds.  I appreciate the opportunity to care for you. Gatha Mayer, MD, FACG   FOLLOW DILATATION DIET GIVEN TO YOU TODAY- LIQUIDS UNTIL 5PM TODAY THEN SOFT FOODS THE REST OF TODAY  Handout on esophagitis given to you today  Handout on esophagitis given to you -await biopsy results  Handout on diverticulosis given to you   Follow anti reflux regimen - orange handout given to you   YOU HAD AN ENDOSCOPIC PROCEDURE TODAY AT [facility]:   Refer to the procedure report that was given to you for any specific questions about what was found during the examination.  If the procedure report does not answer your questions, please call your gastroenterologist to clarify.  If you requested that your care partner not be given the details of your procedure findings, then the procedure report has been included in a sealed envelope for you to review at your convenience later.  YOU SHOULD EXPECT: Some feelings of bloating in the abdomen. Passage of more gas than usual.  Walking can help get rid of the air that was put into your GI tract during the procedure and reduce the bloating. If you had a lower endoscopy (such as a colonoscopy or flexible sigmoidoscopy) you may notice spotting of blood in your stool or on the toilet paper. If you underwent a bowel prep for your procedure, you may not have a normal bowel movement for a few days.  Please Note:  You might notice some irritation and congestion in your nose or some drainage.  This is from the oxygen used during your procedure.  There  is no need for concern and it should clear up in a day or so.  SYMPTOMS TO REPORT IMMEDIATELY:   Following lower endoscopy (colonoscopy or flexible sigmoidoscopy):  Excessive amounts of blood in the stool  Significant tenderness or worsening of abdominal pains  Swelling of the abdomen that is new, acute  Fever of 100F or higher   Following upper endoscopy (EGD)  Vomiting of blood or coffee ground material  New chest pain or pain under the shoulder blades  Painful or persistently difficult swallowing  New shortness of breath  Fever of 100F or higher  Black, tarry-looking stools  For urgent or emergent issues, a gastroenterologist can be reached at any hour by calling [phone]. Do not use MyChart messaging for urgent concerns.    DIET: FOLLOW DILATATION DIET TODAY. We do recommend a small meal at first.  Drink plenty of fluids but you should avoid alcoholic beverages for 24 hours.  ACTIVITY:  You should plan to take it easy for the rest of today and you should NOT DRIVE or use heavy machinery until tomorrow (because of the sedation medicines used during the test).    FOLLOW UP: Our staff will call the number listed on your records 48-72 hours following your procedure to check on you and address any questions or concerns that you may have regarding the information given to you following your procedure. If we do not reach you, we will leave  a message.  We will attempt to reach you two times.  During this call, we will ask if you have developed any symptoms of COVID 19. If you develop any symptoms (ie: fever, flu-like symptoms, shortness of breath, cough etc.) before then, please call [phone].  If you test positive for Covid 19 in the 2 weeks post procedure, please call and report this information to [place].    If any biopsies were taken you will be contacted by phone or by letter within the next 1-3 weeks.  Please call us at [phone] if you have not heard about the biopsies  in 3 weeks.    SIGNATURES/CONFIDENTIALITY: You and/or your care partner have signed paperwork which will be entered into your electronic medical record.  These signatures attest to the fact that that the information above on your After Visit Summary has been reviewed and is understood.  Full responsibility of the confidentiality of this discharge information lies with you and/or your care-partner.

## 2020-08-05 NOTE — Op Note (Signed)
[facility] Patient Name: [name] Procedure Date: 08/05/2020 3:21 PM MRN: [id number] Endoscopist: Gatha Mayer , MD Age: 59 Referring MD:  Date of Birth: 11-16-1961 Gender: Female Account #: [id number] Procedure:                Colonoscopy Indications:              Change in bowel habits Medicines:                Propofol per Anesthesia, Monitored Anesthesia Care Procedure:                Pre-Anesthesia Assessment:                           - Prior to the procedure, a History and Physical                            was performed, and patient medications and                            allergies were reviewed. The patient's tolerance of                            previous anesthesia was also reviewed. The risks                            and benefits of the procedure and the sedation                            options and risks were discussed with the patient.                            All questions were answered, and informed consent                            was obtained. Prior Anticoagulants: The patient has                            taken no previous anticoagulant or antiplatelet                            agents. ASA Grade Assessment: II - A patient with                            mild systemic disease. After reviewing the risks                            and benefits, the patient was deemed in                            satisfactory condition to undergo the procedure.                           After obtaining informed consent, the colonoscope  was passed under direct vision. Throughout the                            procedure, the patient's blood pressure, pulse, and                            oxygen saturations were monitored continuously. The                            Colonoscope was introduced through the anus and                            advanced to the the cecum, identified by                            appendiceal orifice and  ileocecal valve. The                            colonoscopy was performed without difficulty. The                            patient tolerated the procedure well. The quality                            of the bowel preparation was excellent. The                            ileocecal valve, appendiceal orifice, and rectum                            were photographed. Scope In: 3:32:32 PM Scope Out: 3:47:00 PM Scope Withdrawal Time: 0 hours 11 minutes 50 seconds  Total Procedure Duration: 0 hours 14 minutes 28 seconds  Findings:                 The perianal and digital rectal examinations were                            normal.                           A few diverticula were found in the sigmoid colon.                           The exam was otherwise without abnormality on                            direct and retroflexion views. Complications:            No immediate complications. Estimated Blood Loss:     Estimated blood loss: none. Impression:               - Diverticulosis in the sigmoid colon.                           - The examination was otherwise normal on direct  and retroflexion views.                           - No specimens collected. Recommendation:           - Patient has a contact number available for                            emergencies. The signs and symptoms of potential                            delayed complications were discussed with the                            patient. Return to normal activities tomorrow.                            Written discharge instructions were provided to the                            patient.                           - Continue present medications.                           - See the other procedure note for documentation of                            additional recommendations.                           - Repeat colonoscopy in 10 years for screening                            purposes. Gatha Mayer,  MD 08/05/2020 4:09:26 PM This report has been signed electronically.

## 2020-08-05 NOTE — Progress Notes (Signed)
Report given to PACU, vss 

## 2020-08-09 ENCOUNTER — Telehealth: Payer: Self-pay

## 2020-08-09 NOTE — Telephone Encounter (Signed)
  Follow up Call-  Call back number 08/05/2020  Post procedure Call Back phone  # [phone]  Permission to leave phone message Yes  Some recent data might be hidden     Patient questions:  Do you have a fever, pain , or abdominal swelling? No. Pain Score  0 *  Have you tolerated food without any problems? Yes.    Have you been able to return to your normal activities? Yes.    Do you have any questions about your discharge instructions: Diet   No. Medications  No. Follow up visit  No.  Do you have questions or concerns about your Care? No.  Actions: * If pain score is 4 or above: No action needed, pain <4.   1. Have you developed a fever since your procedure? No   2.   Have you had an respiratory symptoms (SOB or cough) since your procedure? No   3.   Have you tested positive for COVID 19 since your procedure? No   4.   Have you had any family members/close contacts diagnosed with the COVID 19 since your procedure? No    If yes to any of these questions please route to Joylene John, RN and Joella Prince, RN

## 2020-08-14 ENCOUNTER — Encounter: Payer: Self-pay | Admitting: Internal Medicine

## 2020-08-16 DIAGNOSIS — D1801 Hemangioma of skin and subcutaneous tissue: Secondary | ICD-10-CM | POA: Diagnosis not present

## 2020-08-16 DIAGNOSIS — L918 Other hypertrophic disorders of the skin: Secondary | ICD-10-CM | POA: Diagnosis not present

## 2020-08-16 DIAGNOSIS — L821 Other seborrheic keratosis: Secondary | ICD-10-CM | POA: Diagnosis not present

## 2020-08-16 DIAGNOSIS — L820 Inflamed seborrheic keratosis: Secondary | ICD-10-CM | POA: Diagnosis not present

## 2020-08-26 ENCOUNTER — Encounter: Payer: 59 | Admitting: Internal Medicine

## 2020-09-01 MED FILL — HUMIRA PEN 40 MG/0.4ML PNKT: 40 | 28 days supply | Qty: 2 | Fill #1

## 2020-09-05 ENCOUNTER — Ambulatory Visit: Payer: 59 | Admitting: Obstetrics & Gynecology

## 2020-09-09 MED FILL — MELOXICAM 15 MG TABLET: 15 | 90 days supply | Qty: 90 | Fill #1

## 2020-09-12 ENCOUNTER — Other Ambulatory Visit (HOSPITAL_COMMUNITY): Payer: Self-pay | Admitting: Family Medicine

## 2020-09-12 MED FILL — CHLORTHALIDONE 25 MG TABS: 25 | 90 days supply | Qty: 90 | Fill #0

## 2020-09-28 MED FILL — HUMIRA PEN 40 MG/0.4ML PNKT: 40 | 28 days supply | Qty: 2 | Fill #2

## 2020-09-29 ENCOUNTER — Other Ambulatory Visit: Payer: Self-pay

## 2020-09-29 ENCOUNTER — Encounter: Payer: Self-pay | Admitting: Obstetrics & Gynecology

## 2020-09-29 ENCOUNTER — Ambulatory Visit (INDEPENDENT_AMBULATORY_CARE_PROVIDER_SITE_OTHER): Payer: 59 | Admitting: Obstetrics & Gynecology

## 2020-09-29 VITALS — BP 126/82 | Ht 66.0 in | Wt 247.0 lb

## 2020-09-29 DIAGNOSIS — Z01419 Encounter for gynecological examination (general) (routine) without abnormal findings: Secondary | ICD-10-CM

## 2020-09-29 MED FILL — LISINOPRIL 20 MG TABLET: 20 | 90 days supply | Qty: 45 | Fill #3

## 2020-09-29 NOTE — Progress Notes (Signed)
GYNECOLOGY ANNUAL PREVENTATIVE CARE ENCOUNTER NOTE  History:     [name] is a 59 y.o. P2 female here for a routine annual gynecologic exam.  Current complaints: occasional vaginal dryness during intercourse, alleviated by use of lubrication.  No bleeding since April 2021, not fully menopausal yet. Occasional left sided pelvic pressure and midline, followed closely for GI issues.   Denies abnormal vaginal bleeding, discharge, pelvic pain, problems with intercourse or other gynecologic concerns.    Gynecologic History Patient's last menstrual period was 09/26/2020. Contraception: none Last Pap: 08/10/2019. Results were: normal with negative HPV Last mammogram: 03/08/2020. Results were: normal   Past Medical History:  Diagnosis Date  . Anal fissure   . Arthritis    rheumatoid arthritis  . Asthma    as a child   . GERD (gastroesophageal reflux disease)   . Helicobacter pylori antibody positive 2006    Tx w/ PPI, amoxicillin and Biaxin  . Hemorrhoids   . Hyperlipidemia   . Hypertension   . Obesity   . PVC's (premature ventricular contractions)    occasional R/O cardiac via [place] yrs ago  . Rheumatoid arthritis [facility])     Past Surgical History:  Procedure Laterality Date  . CERVICAL POLYPECTOMY  12/22/2012   Procedure: CERVICAL POLYPECTOMY;  Surgeon: Thurnell Lose, MD;  Location: [facility];  Service: Gynecology;  Laterality: N/A;  . CESAREAN SECTION     x 2  . COLONOSCOPY    . HYSTEROSCOPY WITH D & C  12/22/2012   Procedure: DILATATION AND CURETTAGE /HYSTEROSCOPY;  Surgeon: Thurnell Lose, MD;  Location: [facility];  Service: Gynecology;;  . PLANTAR'S WART EXCISION      Current Outpatient Medications on File Prior to Visit  Medication Sig Dispense Refill  . acetaminophen (TYLENOL) 325 MG tablet Take 650 mg by mouth as needed.    . Adalimumab (HUMIRA PEN) 40 MG/0.4ML PNKT Inject 1 pen into the skin every 14 (fourteen) days. (every other week) 2 each 5  . Ascorbic Acid  (VITA-C PO) Take by mouth.    . calcium carbonate (TUMS - DOSED IN MG ELEMENTAL CALCIUM) 500 MG chewable tablet Chew 1 tablet by mouth as needed.    . cetirizine (ZYRTEC) 10 MG tablet Take 10 mg by mouth daily.    . chlorthalidone (HYGROTON) 25 MG tablet Take 25 mg by mouth daily.    . Cholecalciferol (VITAMIN D) 2000 UNITS CAPS Take 1 capsule by mouth daily.    Marland Kitchen diltiazem 2 % GEL Diltiazem 2%:lidocaine 5%. Using your index finger, apply a small amount of medication inside the rectum up to your first knuckle/joint twice daily x 4 weeks. 30 g 2  . fluticasone (FLONASE) 50 MCG/ACT nasal spray Place into both nostrils daily.    . folic acid (FOLVITE) 1 MG tablet Take 2 mg by mouth daily.     . meloxicam (MOBIC) 15 MG tablet Take 7.5 mg by mouth daily.     . methotrexate (RHEUMATREX) 2.5 MG tablet Take 20 mg by mouth once a week. Takes on Sundays.  Caution:Chemotherapy. Protect from light.     . Multiple Vitamin (MULTIVITAMIN) tablet Take 1 tablet by mouth daily.    . pantoprazole (PROTONIX) 20 MG tablet Take 1 tablet (20 mg total) by mouth daily before breakfast. 90 tablet 3  . propranolol (INDERAL) 20 MG tablet Take 10 mg by mouth daily.    . shark liver oil-cocoa butter (PREPARATION H) 0.25-3-85.5 % suppository Place 1 suppository rectally as  needed.     No current facility-administered medications on file prior to visit.    No Known Allergies  Social History:  reports that she has never smoked. She has never used smokeless tobacco. She reports that she does not drink alcohol and does not use drugs.  Family History  Problem Relation Age of Onset  . Diabetes Brother        x 2   . Colon cancer Neg Hx     The following portions of the patient's history were reviewed and updated as appropriate: allergies, current medications, past family history, past medical history, past social history, past surgical history and problem list.  Review of Systems Pertinent items noted in HPI and  remainder of comprehensive ROS otherwise negative.  Physical Exam:  BP 126/82   Ht 5\' 6"  (1.676 m)   Wt 247 lb (112 kg)   LMP 09/26/2020   BMI 39.87 kg/m  CONSTITUTIONAL: Well-developed, well-nourished female in no acute distress.  HENT:  Normocephalic, atraumatic, External right and left ear normal. Oropharynx is clear and moist EYES: Conjunctivae and EOM are normal. Pupils are equal, round, and reactive to light. No scleral icterus.  NECK: Normal range of motion, supple, no masses.  Normal thyroid.  SKIN: Skin is warm and dry. No rash noted. Not diaphoretic. No erythema. No pallor. MUSCULOSKELETAL: Normal range of motion. No tenderness.  No cyanosis, clubbing, or edema.  2+ distal pulses. NEUROLOGIC: Alert and oriented to person, place, and time. Normal reflexes, muscle tone coordination.  PSYCHIATRIC: Normal mood and affect. Normal behavior. Normal judgment and thought content. CARDIOVASCULAR: Normal heart rate noted, regular rhythm RESPIRATORY: Clear to auscultation bilaterally. Effort and breath sounds normal, no problems with respiration noted. BREASTS: Symmetric in size. No masses, tenderness, skin changes, nipple drainage, or lymphadenopathy bilaterally. Performed in the presence of a chaperone. ABDOMEN: Soft, no distention noted.  Mild LLQ TTP and suprapubic tenderness, no rebound or guarding.  PELVIC: Normal appearing external genitalia and urethral meatus; normal appearing vaginal mucosa and cervix.  No abnormal discharge noted.  Normal uterine size, no other palpable masses, no uterine or adnexal tenderness.  Performed in the presence of a chaperone.   Assessment and Plan:      1. Well woman exam with routine gynecological exam Normal gynecologic exam, no GYN etiology for pain seen. Recommended continued use of lubricants as needed, can consider topical estrogen therapy if vaginal dryness worsens. Mammogram is up to date. Routine preventative health maintenance measures  emphasized. Please refer to After Visit Summary for other counseling recommendations.      Verita Schneiders, MD, [place] for [employer], [place]

## 2020-09-29 NOTE — Patient Instructions (Addendum)

## 2020-10-11 ENCOUNTER — Other Ambulatory Visit (HOSPITAL_COMMUNITY): Payer: Self-pay | Admitting: Rheumatology

## 2020-10-11 DIAGNOSIS — Z79899 Other long term (current) drug therapy: Secondary | ICD-10-CM | POA: Diagnosis not present

## 2020-10-11 DIAGNOSIS — E669 Obesity, unspecified: Secondary | ICD-10-CM | POA: Diagnosis not present

## 2020-10-11 DIAGNOSIS — M150 Primary generalized (osteo)arthritis: Secondary | ICD-10-CM | POA: Diagnosis not present

## 2020-10-11 DIAGNOSIS — M0579 Rheumatoid arthritis with rheumatoid factor of multiple sites without organ or systems involvement: Secondary | ICD-10-CM | POA: Diagnosis not present

## 2020-10-11 DIAGNOSIS — M2550 Pain in unspecified joint: Secondary | ICD-10-CM | POA: Diagnosis not present

## 2020-10-11 DIAGNOSIS — Z6838 Body mass index (BMI) 38.0-38.9, adult: Secondary | ICD-10-CM | POA: Diagnosis not present

## 2020-10-11 MED FILL — FOLIC ACID 1 MG TABS: 1 | 90 days supply | Qty: 180 | Fill #0

## 2020-10-11 MED FILL — METHOTREXATE SODIUM 2.5 MG: 2.5 | 84 days supply | Qty: 96 | Fill #0

## 2020-11-02 DIAGNOSIS — Z0000 Encounter for general adult medical examination without abnormal findings: Secondary | ICD-10-CM | POA: Diagnosis not present

## 2020-11-02 DIAGNOSIS — R739 Hyperglycemia, unspecified: Secondary | ICD-10-CM | POA: Diagnosis not present

## 2020-11-02 DIAGNOSIS — E785 Hyperlipidemia, unspecified: Secondary | ICD-10-CM | POA: Diagnosis not present

## 2020-11-02 DIAGNOSIS — E559 Vitamin D deficiency, unspecified: Secondary | ICD-10-CM | POA: Diagnosis not present

## 2020-11-02 DIAGNOSIS — I10 Essential (primary) hypertension: Secondary | ICD-10-CM | POA: Diagnosis not present

## 2020-11-02 DIAGNOSIS — M069 Rheumatoid arthritis, unspecified: Secondary | ICD-10-CM | POA: Diagnosis not present

## 2020-11-02 DIAGNOSIS — J45909 Unspecified asthma, uncomplicated: Secondary | ICD-10-CM | POA: Diagnosis not present

## 2020-11-07 ENCOUNTER — Other Ambulatory Visit: Payer: Self-pay | Admitting: Pharmacist

## 2020-11-07 MED ORDER — HUMIRA PEN 40 MG/0.8ML ~~LOC~~ PNKT: PEN_INJECTOR | SUBCUTANEOUS | 0 refills | Status: DC

## 2020-11-07 MED FILL — HUMIRA PEN 40 MG/0.8ML PNKT: 40 | 28 days supply | Qty: 2 | Fill #0

## 2020-11-08 DIAGNOSIS — H524 Presbyopia: Secondary | ICD-10-CM | POA: Diagnosis not present

## 2020-11-08 DIAGNOSIS — H52203 Unspecified astigmatism, bilateral: Secondary | ICD-10-CM | POA: Diagnosis not present

## 2020-11-08 DIAGNOSIS — H5213 Myopia, bilateral: Secondary | ICD-10-CM | POA: Diagnosis not present

## 2020-11-09 ENCOUNTER — Encounter: Payer: Self-pay | Admitting: General Practice

## 2020-11-09 DIAGNOSIS — Z8371 Family history of colonic polyps: Secondary | ICD-10-CM | POA: Diagnosis not present

## 2020-11-23 ENCOUNTER — Encounter: Payer: Self-pay | Admitting: General Practice

## 2020-12-01 ENCOUNTER — Other Ambulatory Visit: Payer: Self-pay | Admitting: Pharmacist

## 2020-12-01 MED ORDER — HUMIRA PEN 40 MG/0.8ML ~~LOC~~ PNKT
PEN_INJECTOR | SUBCUTANEOUS | 6 refills | Status: DC
Start: 2020-12-01 — End: 2021-01-24

## 2020-12-02 MED FILL — HUMIRA PEN 40 MG/0.8ML PNKT: 40 | 28 days supply | Qty: 2 | Fill #0

## 2020-12-21 MED FILL — CHLORTHALIDONE 25 MG TABS: 25 | 90 days supply | Qty: 90 | Fill #1

## 2020-12-21 MED FILL — LISINOPRIL 20 MG TABS: 20 | 90 days supply | Qty: 45 | Fill #4

## 2020-12-28 MED FILL — HUMIRA PEN 40 MG/0.8ML PNKT: 40 | 28 days supply | Qty: 2 | Fill #1

## 2021-01-10 DIAGNOSIS — M0579 Rheumatoid arthritis with rheumatoid factor of multiple sites without organ or systems involvement: Secondary | ICD-10-CM | POA: Diagnosis not present

## 2021-01-19 MED FILL — METHOTREXATE SODIUM 2.5 MG: 2.5 | 84 days supply | Qty: 96 | Fill #1

## 2021-01-24 ENCOUNTER — Other Ambulatory Visit: Payer: Self-pay | Admitting: Pharmacist

## 2021-01-24 MED ORDER — HUMIRA (2 PEN) 40 MG/0.4ML ~~LOC~~ AJKT: AUTO-INJECTOR | SUBCUTANEOUS | 5 refills | Status: DC

## 2021-01-26 MED FILL — HUMIRA PEN 40 MG/0.8ML PNKT: 40 | 28 days supply | Qty: 2 | Fill #2

## 2021-02-02 MED FILL — FOLIC ACID 1 MG TABS: 1 | 90 days supply | Qty: 180 | Fill #1

## 2021-02-20 ENCOUNTER — Other Ambulatory Visit: Payer: Self-pay | Admitting: Obstetrics & Gynecology

## 2021-02-20 DIAGNOSIS — Z1231 Encounter for screening mammogram for malignant neoplasm of breast: Secondary | ICD-10-CM

## 2021-02-21 MED FILL — HUMIRA PEN 40 MG/0.4ML PNKT: 40 | 28 days supply | Qty: 2 | Fill #0

## 2021-03-14 MED FILL — MELOXICAM 15 MG TABLET: 15 | 90 days supply | Qty: 90 | Fill #0

## 2021-03-20 MED FILL — HUMIRA PEN 40 MG/0.4ML PNKT: 40 | 28 days supply | Qty: 2 | Fill #1

## 2021-03-21 ENCOUNTER — Other Ambulatory Visit (HOSPITAL_COMMUNITY): Payer: Self-pay | Admitting: Family Medicine

## 2021-03-21 MED FILL — LISINOPRIL 20 MG TABS: 20 | 90 days supply | Qty: 45 | Fill #0

## 2021-03-21 MED FILL — CHLORTHALIDONE 25 MG TABS: 25 | 90 days supply | Qty: 90 | Fill #2

## 2021-03-23 ENCOUNTER — Other Ambulatory Visit (HOSPITAL_COMMUNITY): Payer: Self-pay

## 2021-03-29 ENCOUNTER — Ambulatory Visit
Admission: RE | Admit: 2021-03-29 | Discharge: 2021-03-29 | Disposition: A | Discharge status: Home / Self Care | Payer: 59 | Source: Ambulatory Visit

## 2021-03-29 ENCOUNTER — Other Ambulatory Visit: Payer: Self-pay

## 2021-03-29 ENCOUNTER — Other Ambulatory Visit (HOSPITAL_COMMUNITY): Payer: Self-pay

## 2021-03-29 DIAGNOSIS — Z1231 Encounter for screening mammogram for malignant neoplasm of breast: Secondary | ICD-10-CM | POA: Diagnosis not present

## 2021-04-11 DIAGNOSIS — M150 Primary generalized (osteo)arthritis: Secondary | ICD-10-CM | POA: Diagnosis not present

## 2021-04-11 DIAGNOSIS — E669 Obesity, unspecified: Secondary | ICD-10-CM | POA: Diagnosis not present

## 2021-04-11 DIAGNOSIS — Z6836 Body mass index (BMI) 36.0-36.9, adult: Secondary | ICD-10-CM | POA: Diagnosis not present

## 2021-04-11 DIAGNOSIS — M2550 Pain in unspecified joint: Secondary | ICD-10-CM | POA: Diagnosis not present

## 2021-04-11 DIAGNOSIS — M0579 Rheumatoid arthritis with rheumatoid factor of multiple sites without organ or systems involvement: Secondary | ICD-10-CM | POA: Diagnosis not present

## 2021-04-11 DIAGNOSIS — Z79899 Other long term (current) drug therapy: Secondary | ICD-10-CM | POA: Diagnosis not present

## 2021-04-14 ENCOUNTER — Other Ambulatory Visit (HOSPITAL_COMMUNITY): Payer: Self-pay

## 2021-04-14 MED FILL — Adalimumab Auto-injector Kit 40 MG/0.4ML: SUBCUTANEOUS | 28 days supply | Qty: 2 | Fill #0 | Status: AC

## 2021-04-17 ENCOUNTER — Other Ambulatory Visit (HOSPITAL_COMMUNITY): Payer: Self-pay

## 2021-04-18 ENCOUNTER — Other Ambulatory Visit (HOSPITAL_COMMUNITY): Payer: Self-pay

## 2021-04-18 MED ORDER — METHOTREXATE 2.5 MG PO TABS
2.5000 mg | ORAL_TABLET | Freq: Every day | ORAL | 0 refills | Status: DC
  Filled 2021-04-18: qty 96, 84d supply, fill #0

## 2021-04-19 ENCOUNTER — Other Ambulatory Visit (HOSPITAL_COMMUNITY): Payer: Self-pay

## 2021-04-20 ENCOUNTER — Other Ambulatory Visit (HOSPITAL_COMMUNITY): Payer: Self-pay

## 2021-04-22 ENCOUNTER — Other Ambulatory Visit (HOSPITAL_COMMUNITY): Payer: Self-pay

## 2021-05-04 ENCOUNTER — Other Ambulatory Visit (HOSPITAL_COMMUNITY): Payer: Self-pay

## 2021-05-05 ENCOUNTER — Other Ambulatory Visit (HOSPITAL_COMMUNITY): Payer: Self-pay

## 2021-05-05 MED ORDER — FOLIC ACID 1 MG PO TABS
2.0000 mg | ORAL_TABLET | Freq: Every day | ORAL | 3 refills | Status: DC
  Filled 2021-05-05: qty 180, 90d supply, fill #0
  Filled 2021-08-02: qty 180, 90d supply, fill #1
  Filled 2021-11-20: qty 180, 90d supply, fill #2
  Filled 2022-02-21: qty 180, 90d supply, fill #3

## 2021-05-15 ENCOUNTER — Other Ambulatory Visit (HOSPITAL_COMMUNITY): Payer: Self-pay

## 2021-05-15 MED ORDER — FLUCONAZOLE 150 MG PO TABS
ORAL_TABLET | ORAL | 1 refills | Status: DC
  Filled 2021-05-15: qty 12, 90d supply, fill #0

## 2021-05-16 ENCOUNTER — Other Ambulatory Visit (HOSPITAL_COMMUNITY): Payer: Self-pay

## 2021-05-16 MED FILL — Adalimumab Auto-injector Kit 40 MG/0.4ML: SUBCUTANEOUS | 28 days supply | Qty: 2 | Fill #1 | Status: AC

## 2021-05-17 ENCOUNTER — Other Ambulatory Visit (HOSPITAL_COMMUNITY): Payer: Self-pay

## 2021-05-23 ENCOUNTER — Telehealth: Payer: Self-pay | Admitting: Pharmacist

## 2021-05-23 NOTE — Telephone Encounter (Signed)
Called patient to schedule an appointment for the [employer] [facility] [employer] [facility]. I was unable to reach the patient so I left a HIPAA-compliant message requesting that the patient return my call.   Luke Van Ausdall, PharmD, BCACP, CPP Clinical Pharmacist [facility] [phone]  

## 2021-05-24 ENCOUNTER — Ambulatory Visit: Payer: 59 | Attending: Family Medicine | Admitting: Pharmacist

## 2021-05-24 ENCOUNTER — Other Ambulatory Visit: Payer: Self-pay

## 2021-05-24 DIAGNOSIS — Z79899 Other long term (current) drug therapy: Secondary | ICD-10-CM

## 2021-05-24 NOTE — Progress Notes (Signed)
   S: Patient presents to [facility] for review of their specialty medication therapy.  Patient is currently taking Humira for rheumatoid arthritis. Patient is managed by Dr. Trudie Reed for this.   Adherence: denies any missed doses  Efficacy: reports that it is working well for her. Admits that efficacy is not 100% but it does effectively help manage her symptoms.   Dosing: Rheumatoid arthritis: SubQ: 40 mg every other week (may continue methotrexate, other nonbiologic DMARDS, corticosteroids, NSAIDs, and/or analgesics); patients not taking concomitant methotrexate may increase dose to 40 mg every week  Dose adjustments: Renal: no dose adjustments (has not been studied) Hepatic: no dose adjustments (has not been studied)  Screening: TB test: completed and was negative per patient Hepatitis: completed per patient  Monitoring: S/sx of infection: denies CBC: monitored q3 months and reports it is WNL S/sx of hypersensitivity: denies S/sx of malignancy: denies S/sx of heart failure: denies  O:     Lab Results  Component Value Date   WBC 7.3 12/18/2012   HGB 12.8 12/18/2012   HCT 40.0 12/18/2012   MCV 91.7 12/18/2012   PLT 269 12/18/2012      Chemistry      Component Value Date/Time   NA 139 12/18/2012 0925   K 4.0 12/18/2012 0925   CL 103 12/18/2012 0925   CO2 30 12/18/2012 0925   BUN 14 12/18/2012 0925   CREATININE 0.67 12/18/2012 0925      Component Value Date/Time   CALCIUM 9.3 12/18/2012 0925   ALKPHOS 47 06/29/2009 2026   AST 17 06/29/2009 2026   ALT 19 06/29/2009 2026   BILITOT 0.4 06/29/2009 2026       A/P: 1. Medication review: Patient currently on Humira for rheumatoid arthritis and continues to tolerate it well with no adverse effects. Reviewed the medication with the patient, including the following: Humira is a TNF blocking agent indicated for ankylosing spondylitis, Crohn's disease, Hidradenitis suppurativa, psoriatic arthritis, plaque  psoriasis, ulcerative colitis, and uveitis. Patient educated on purpose, proper use and potential adverse effects of Humira. The most common adverse effects are infections, headache, and injection site reactions. 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 There is the possibility of an increased risk of malignancy but it is not well understood if this increased risk is due to there medication or the disease state. There are rare cases of pancytopenia and aplastic anemia. No recommendations for any changes at this time.  Benard Halsted, PharmD, [place], [place] [phone]

## 2021-05-30 ENCOUNTER — Other Ambulatory Visit (HOSPITAL_COMMUNITY): Payer: Self-pay

## 2021-06-01 ENCOUNTER — Other Ambulatory Visit (HOSPITAL_COMMUNITY): Payer: Self-pay

## 2021-06-01 MED ORDER — CARESTART COVID-19 HOME TEST VI KIT
PACK | 0 refills | Status: DC
Start: 2021-06-01 — End: 2021-07-06
  Filled 2021-06-01: qty 4, 4d supply, fill #0

## 2021-06-06 ENCOUNTER — Other Ambulatory Visit (HOSPITAL_COMMUNITY): Payer: Self-pay

## 2021-06-07 ENCOUNTER — Other Ambulatory Visit (HOSPITAL_COMMUNITY): Payer: Self-pay

## 2021-06-07 MED FILL — Adalimumab Auto-injector Kit 40 MG/0.4ML: SUBCUTANEOUS | 28 days supply | Qty: 2 | Fill #2 | Status: AC

## 2021-06-14 ENCOUNTER — Other Ambulatory Visit (HOSPITAL_COMMUNITY): Payer: Self-pay

## 2021-06-19 ENCOUNTER — Other Ambulatory Visit: Payer: Self-pay | Admitting: *Deleted

## 2021-06-19 DIAGNOSIS — N950 Postmenopausal bleeding: Secondary | ICD-10-CM

## 2021-06-20 ENCOUNTER — Other Ambulatory Visit (HOSPITAL_COMMUNITY): Payer: Self-pay

## 2021-06-20 MED FILL — Chlorthalidone Tab 25 MG: ORAL | 90 days supply | Qty: 90 | Fill #0 | Status: AC

## 2021-06-20 MED FILL — Lisinopril Tab 20 MG: ORAL | 90 days supply | Qty: 45 | Fill #0 | Status: AC

## 2021-06-22 ENCOUNTER — Ambulatory Visit
Admission: RE | Admit: 2021-06-22 | Discharge: 2021-06-22 | Disposition: A | Discharge status: Home / Self Care | Payer: 59 | Source: Ambulatory Visit | Attending: Obstetrics & Gynecology | Admitting: Obstetrics & Gynecology

## 2021-06-22 ENCOUNTER — Other Ambulatory Visit: Payer: Self-pay

## 2021-06-22 DIAGNOSIS — N950 Postmenopausal bleeding: Secondary | ICD-10-CM | POA: Diagnosis not present

## 2021-06-22 DIAGNOSIS — D259 Leiomyoma of uterus, unspecified: Secondary | ICD-10-CM | POA: Diagnosis not present

## 2021-06-22 DIAGNOSIS — N859 Noninflammatory disorder of uterus, unspecified: Secondary | ICD-10-CM | POA: Diagnosis not present

## 2021-06-23 ENCOUNTER — Encounter: Payer: Self-pay | Admitting: Obstetrics & Gynecology

## 2021-06-23 ENCOUNTER — Other Ambulatory Visit (HOSPITAL_COMMUNITY): Payer: Self-pay

## 2021-06-23 ENCOUNTER — Other Ambulatory Visit: Payer: Self-pay | Admitting: Obstetrics & Gynecology

## 2021-06-23 DIAGNOSIS — N950 Postmenopausal bleeding: Secondary | ICD-10-CM

## 2021-06-23 HISTORY — DX: Postmenopausal bleeding: N95.0

## 2021-06-23 MED ORDER — MISOPROSTOL 200 MCG PO TABS
ORAL_TABLET | ORAL | 2 refills | Status: DC
  Filled 2021-06-23: qty 2, 1d supply, fill #0

## 2021-06-23 NOTE — Progress Notes (Signed)
As per patient request, Misoprostol ordered to be placed pre-endometrial biopsy as recommended for evaluation for her postmenopausal bleeding evaluation.

## 2021-07-06 ENCOUNTER — Encounter: Payer: Self-pay | Admitting: Obstetrics & Gynecology

## 2021-07-06 ENCOUNTER — Other Ambulatory Visit (HOSPITAL_COMMUNITY)
Admission: RE | Admit: 2021-07-06 | Discharge: 2021-07-06 | Disposition: A | Discharge status: Home / Self Care | Payer: 59 | Source: Ambulatory Visit | Attending: Obstetrics & Gynecology | Admitting: Obstetrics & Gynecology

## 2021-07-06 ENCOUNTER — Other Ambulatory Visit: Payer: Self-pay

## 2021-07-06 ENCOUNTER — Ambulatory Visit (INDEPENDENT_AMBULATORY_CARE_PROVIDER_SITE_OTHER): Payer: 59 | Admitting: Obstetrics & Gynecology

## 2021-07-06 VITALS — BP 137/59 | HR 88 | Ht 67.0 in | Wt 248.0 lb

## 2021-07-06 DIAGNOSIS — N8501 Benign endometrial hyperplasia: Secondary | ICD-10-CM | POA: Diagnosis not present

## 2021-07-06 DIAGNOSIS — N950 Postmenopausal bleeding: Secondary | ICD-10-CM | POA: Insufficient documentation

## 2021-07-06 NOTE — Addendum Note (Signed)
Addended by: Verita Schneiders A on: 07/06/2021 12:05 PM   Modules accepted: Orders

## 2021-07-06 NOTE — Patient Instructions (Signed)

## 2021-07-06 NOTE — Progress Notes (Signed)
      GYNECOLOGY OFFICE PROCEDURE NOTE   [name] is a 60 y.o. [id number] here for endometrial biopsy for postmenopausal bleeding. Recent ultrasound also showed 8.6 mm endometrial stripe.  Today, she reports no concerning symptoms. Of note, pap on 08/10/2019 was negative cytology, negative HPV.   ENDOMETRIAL BIOPSY     The indications for endometrial biopsy were reviewed.   Risks of the biopsy including cramping, bleeding, infection, uterine perforation, inadequate specimen and need for additional procedures were discussed. The patient states she understands the R/B/I/A and agrees to undergo procedure today.  Consent was signed. Time out was performed.    Patient was positioned in dorsal lithotomy position. A vaginal speculum was placed.  The cervix was visualized and was prepped with Betadine.  A single-toothed tenaculum was placed on the anterior lip of the cervix to stabilize it. The 3 mm pipelle was easily introduced into the endometrial cavity without difficulty to a depth of 8.5 cm, and a moderate amount of tissue was obtained after two passes and sent to pathology. The instruments were removed from the patient's vagina. Minimal bleeding from the cervix was noted. The patient tolerated the procedure well.   Patient was given post procedure instructions.  Will follow up pathology and manage accordingly; patient will be contacted with results and recommendations.  Routine preventative health maintenance measures emphasized.       Verita Schneiders, MD, [place] for [employer], [place]

## 2021-07-07 ENCOUNTER — Other Ambulatory Visit (HOSPITAL_COMMUNITY): Payer: Self-pay

## 2021-07-10 ENCOUNTER — Encounter: Payer: Self-pay | Admitting: Obstetrics & Gynecology

## 2021-07-10 ENCOUNTER — Other Ambulatory Visit (HOSPITAL_COMMUNITY): Payer: Self-pay

## 2021-07-10 DIAGNOSIS — M0579 Rheumatoid arthritis with rheumatoid factor of multiple sites without organ or systems involvement: Secondary | ICD-10-CM | POA: Diagnosis not present

## 2021-07-10 DIAGNOSIS — N8501 Benign endometrial hyperplasia: Secondary | ICD-10-CM | POA: Insufficient documentation

## 2021-07-10 LAB — SURGICAL PATHOLOGY

## 2021-07-10 MED FILL — Adalimumab Auto-injector Kit 40 MG/0.4ML: SUBCUTANEOUS | 28 days supply | Qty: 2 | Fill #3 | Status: AC

## 2021-07-10 NOTE — Progress Notes (Signed)
Discussed findings of complex endometrial hyperplasia without atypia with patient, hysterectomy recommended for definitive management.  Also discussed less desirable alternative of progestin IUD therapy and endometrial sampling every three months, she declined this option.  Recommended minimally invasive modalities: vaginal vs laparoscopic vs robotic assisted. She will consider these modalities and let me know what she decides; and it will be arranged accordingly.     Verita Schneiders, MD

## 2021-07-11 ENCOUNTER — Other Ambulatory Visit (HOSPITAL_COMMUNITY): Payer: Self-pay

## 2021-07-11 MED ORDER — METHOTREXATE SODIUM 2.5 MG PO TABS
20.0000 mg | ORAL_TABLET | ORAL | 0 refills | Status: DC
  Filled 2021-07-11: qty 96, 84d supply, fill #0

## 2021-07-13 ENCOUNTER — Other Ambulatory Visit (HOSPITAL_COMMUNITY): Payer: Self-pay

## 2021-07-18 ENCOUNTER — Encounter: Payer: Self-pay | Admitting: *Deleted

## 2021-07-18 ENCOUNTER — Telehealth: Payer: Self-pay | Admitting: *Deleted

## 2021-07-18 NOTE — Telephone Encounter (Signed)
Call to patient regarding surgery date options. Patient is traveling in early August and would like to wait till after 08-10-21. Advised will call her back once scheduled.

## 2021-07-18 NOTE — Telephone Encounter (Signed)
Call to patient. Advised surgery scheduled for Wednesday, 08-23-21 at 0830 at [facility]. Advised to expect pre-op call from surgery center with additional instructions.  Encounter closed.

## 2021-08-02 ENCOUNTER — Other Ambulatory Visit (HOSPITAL_COMMUNITY): Payer: Self-pay

## 2021-08-03 ENCOUNTER — Other Ambulatory Visit: Payer: Self-pay | Admitting: Pharmacist

## 2021-08-03 ENCOUNTER — Other Ambulatory Visit (HOSPITAL_COMMUNITY): Payer: Self-pay

## 2021-08-03 MED ORDER — HUMIRA (2 PEN) 40 MG/0.4ML ~~LOC~~ AJKT
AUTO-INJECTOR | SUBCUTANEOUS | 5 refills | Status: DC
  Filled 2021-08-03: qty 2, 28d supply, fill #0
  Filled 2021-09-20: qty 2, 28d supply, fill #1
  Filled 2021-11-01 – 2021-12-11 (×5): qty 2, 28d supply, fill #2

## 2021-08-03 MED ORDER — HUMIRA (2 PEN) 40 MG/0.4ML ~~LOC~~ AJKT: AUTO-INJECTOR | SUBCUTANEOUS | 5 refills | Status: DC

## 2021-08-04 ENCOUNTER — Other Ambulatory Visit (HOSPITAL_COMMUNITY): Payer: Self-pay

## 2021-08-08 ENCOUNTER — Telehealth: Payer: Self-pay | Admitting: *Deleted

## 2021-08-08 NOTE — Telephone Encounter (Signed)
Call to patient. Notified of surgical time change to 0930 and plan arrival at 0730.  Encounter closed.

## 2021-08-09 ENCOUNTER — Other Ambulatory Visit (HOSPITAL_COMMUNITY): Payer: Self-pay

## 2021-08-11 ENCOUNTER — Other Ambulatory Visit (HOSPITAL_COMMUNITY): Payer: Self-pay

## 2021-08-15 ENCOUNTER — Encounter (HOSPITAL_BASED_OUTPATIENT_CLINIC_OR_DEPARTMENT_OTHER): Payer: Self-pay | Admitting: Obstetrics & Gynecology

## 2021-08-15 ENCOUNTER — Other Ambulatory Visit: Payer: Self-pay

## 2021-08-15 DIAGNOSIS — L820 Inflamed seborrheic keratosis: Secondary | ICD-10-CM | POA: Diagnosis not present

## 2021-08-15 DIAGNOSIS — L821 Other seborrheic keratosis: Secondary | ICD-10-CM | POA: Diagnosis not present

## 2021-08-15 DIAGNOSIS — D1801 Hemangioma of skin and subcutaneous tissue: Secondary | ICD-10-CM | POA: Diagnosis not present

## 2021-08-15 DIAGNOSIS — L918 Other hypertrophic disorders of the skin: Secondary | ICD-10-CM | POA: Diagnosis not present

## 2021-08-15 DIAGNOSIS — L814 Other melanin hyperpigmentation: Secondary | ICD-10-CM | POA: Diagnosis not present

## 2021-08-15 DIAGNOSIS — N950 Postmenopausal bleeding: Secondary | ICD-10-CM

## 2021-08-15 DIAGNOSIS — L409 Psoriasis, unspecified: Secondary | ICD-10-CM

## 2021-08-15 HISTORY — DX: Postmenopausal bleeding: N95.0

## 2021-08-15 HISTORY — DX: Psoriasis, unspecified: L40.9

## 2021-08-15 NOTE — Progress Notes (Signed)
YOU ARE SCHEDULED FOR A COVID TEST ON    08-21-2021 . THIS TEST MUST BE DONE BEFORE SURGERY. GO TO  [facility] @ [place] IN YOUR CAR, THIS IS A DRIVE UP TEST. AFTER YOUR COVID TEST , PLEASE WEAR A MASK OUT IN PUBLIC AND SOCIAL DISTANCE AND [place] YOUR HANDS FREQUENTLY. PLEASE ASK ALL YOUR CLOSE HOUSEHOLD CONTACT TO WEAR MASK OUT IN PUBLIC AND SOCIAL DISTANCE AND [place] HANDS FREQUENTLY ALSO.      Your procedure is scheduled on 08-23-2021  Report to [facility].   Call this number if you have problems the morning of surgery  :[phone].   OUR ADDRESS IS [address] [place].  WE [facility] LOCATED [facility].  PLEASE BRING YOUR INSURANCE CARD AND PHOTO ID DAY OF SURGERY.  ONLY ONE PERSON ALLOWED IN FACILITY WAITING AREA.                                     REMEMBER:  DO NOT EAT FOOD, CANDY GUM OR MINTS  AFTER MIDNIGHT . YOU MAY HAVE CLEAR LIQUIDS FROM MIDNIGHT UNTIL 430 AM. NO CLEAR LIQUIDS AFTER 430 AM DAY OF SURGERY.   YOU MAY  BRUSH YOUR TEETH MORNING OF SURGERY AND RINSE YOUR MOUTH OUT, NO CHEWING GUM CANDY OR MINTS.    CLEAR LIQUID DIET   Foods Allowed                                                                     Foods Excluded  Coffee and tea, regular and decaf                             liquids that you cannot  Plain Jell-O any favor except red or purple                                           see through such as: Fruit ices (not with fruit pulp)                                     milk, soups, orange juice  Iced Popsicles                                    All solid food Carbonated beverages, regular and diet                                    Cranberry, grape and apple juices Sports drinks like Gatorade Lightly seasoned clear broth or consume(fat free) Sugar  Sample Menu Breakfast  Lunch                                      Supper Cranberry juice                    Beef broth                            Chicken broth Jell-O                                     Grape juice                           Apple juice Coffee or tea                        Jell-O                                      Popsicle                                                Coffee or tea                        Coffee or tea  _____________________________________________________________________     TAKE THESE MEDICATIONS MORNING OF SURGERY WITH A SIP OF WATER: CERTRIZINE OR ALLEGRA, FLONASE NASAL SPRAY. DO NOT TAKE TOUR BLOOD PRESSURE MEDICATIONS MORNING OF SURGERY THEY WILL BE GIVEN AFTER YOUR SURGERY.  ONE VISITOR IS ALLOWED IN WAITING ROOM ONLY DAY OF SURGERY.  NO VISITOR MAY SPEND THE NIGHT.  VISITOR ARE ALLOWED TO STAY UNTIL 800 PM.                                    DO NOT WEAR JEWERLY, MAKE UP. DO NOT WEAR LOTIONS, POWDERS, PERFUMES OR NAIL POLISH. DO NOT SHAVE FOR 48 HOURS PRIOR TO DAY OF SURGERY. MEN MAY SHAVE FACE AND NECK. CONTACTS, GLASSES, OR DENTURES MAY NOT BE WORN TO SURGERY.                                    [facility] IS NOT RESPONSIBLE  FOR ANY BELONGINGS.                                                                    Marland Kitchen           [facility] - Preparing for Surgery Before surgery, you can play an important role.  Because skin is not sterile, your skin needs to be as free of germs as possible.  You can  reduce the number of germs on your skin by washing with CHG (chlorahexidine gluconate) soap before surgery.  CHG is an antiseptic cleaner which kills germs and bonds with the skin to continue killing germs even after washing. Please DO NOT use if you have an allergy to CHG or antibacterial soaps.  If your skin becomes reddened/irritated stop using the CHG and inform your nurse when you arrive at Short Stay. Do not shave (including legs and underarms) for at least 48 hours prior to the first CHG shower.  You may shave your  face/neck. Please follow these instructions carefully:  1.  Shower with CHG Soap the night before surgery and the  morning of Surgery.  2.  If you choose to wash your hair, wash your hair first as usual with your  normal  shampoo.  3.  After you shampoo, rinse your hair and body thoroughly to remove the  shampoo.                            4.  Use CHG as you would any other liquid soap.  You can apply chg directly  to the skin and wash                      Gently with a scrungie or clean washcloth.  5.  Apply the CHG Soap to your body ONLY FROM THE NECK DOWN.   Do not use on face/ open                           Wound or open sores. Avoid contact with eyes, ears mouth and genitals (private parts).                       Wash face,  Genitals (private parts) with your normal soap.             6.  Wash thoroughly, paying special attention to the area where your surgery  will be performed.  7.  Thoroughly rinse your body with warm water from the neck down.  8.  DO NOT shower/wash with your normal soap after using and rinsing off  the CHG Soap.                9.  Pat yourself dry with a clean towel.            10.  Wear clean pajamas.            11.  Place clean sheets on your bed the night of your first shower and do not  sleep with pets. Day of Surgery : Do not apply any lotions/deodorants the morning of surgery.  Please wear clean clothes to the hospital/surgery center.  FAILURE TO FOLLOW THESE INSTRUCTIONS MAY RESULT IN THE CANCELLATION OF YOUR SURGERY PATIENT SIGNATURE_________________________________  NURSE SIGNATURE__________________________________  ________________________________________________________________________                                                        QUESTIONS Hansel Feinstein PRE OP NURSE PHONE [phone].

## 2021-08-15 NOTE — Progress Notes (Signed)
Spoke w/ via phone for pre-op interview---pt Lab needs dos---- urine preg              Lab results------lab appt 08-21-2021 for [facility] t & s ekg COVID test ---08-21-2021 overnight stay Arrive 730 am 08-23-2021 NPO after MN NO Solid Food.  Clear liquids from MN until---630 am Med rec completed Medications to take morning of surgery -----certrizine or allegra,  flonase nasal spray Diabetic medication -----n/a Patient instructed no nail polish to be worn day of surgery Patient instructed to bring photo id and insurance card day of surgery Patient aware to have Driver (ride ) / caregiver spouse david    for 24 hours after surgery  Patient Special Instructions -----pt given overnight stay instructions Pre-Op special Istructions -----none Patient verbalized understanding of instructions that were given at this phone interview. Patient denies shortness of breath, chest pain, fever, cough at this phone interview.   [facility] rheumatology dr a Trudie Reed 04-11-2021 epic, pt told to hold humira 1 to 2 weeks before surgery per dr Trudie Reed

## 2021-08-21 ENCOUNTER — Encounter (HOSPITAL_COMMUNITY)
Admission: RE | Admit: 2021-08-21 | Discharge: 2021-08-21 | Disposition: A | Discharge status: Home / Self Care | Payer: 59 | Source: Ambulatory Visit | Attending: Obstetrics & Gynecology | Admitting: Obstetrics & Gynecology

## 2021-08-21 ENCOUNTER — Other Ambulatory Visit: Payer: Self-pay

## 2021-08-21 DIAGNOSIS — Z01818 Encounter for other preprocedural examination: Secondary | ICD-10-CM | POA: Insufficient documentation

## 2021-08-21 LAB — CBC
HCT: 42.1 % (ref 36.0–46.0)
Hemoglobin: 14 g/dL (ref 12.0–15.0)
MCH: 31 pg (ref 26.0–34.0)
MCHC: 33.3 g/dL (ref 30.0–36.0)
MCV: 93.3 fL (ref 80.0–100.0)
Platelets: 298 10*3/uL (ref 150–400)
RBC: 4.51 MIL/uL (ref 3.87–5.11)
RDW: 14.5 % (ref 11.5–15.5)
WBC: 7.7 10*3/uL (ref 4.0–10.5)
nRBC: 0 % (ref 0.0–0.2)

## 2021-08-21 LAB — BASIC METABOLIC PANEL
Anion gap: 9 (ref 5–15)
BUN: 16 mg/dL (ref 6–20)
CO2: 31 mmol/L (ref 22–32)
Calcium: 9.6 mg/dL (ref 8.9–10.3)
Chloride: 99 mmol/L (ref 98–111)
Creatinine, Ser: 0.65 mg/dL (ref 0.44–1.00)
GFR, Estimated: >60 mL/min (ref >60)
Glucose, Bld: 154 mg/dL — ABNORMAL HIGH (ref 70–99)
Potassium: 4.7 mmol/L (ref 3.5–5.1)
Sodium: 139 mmol/L (ref 135–145)

## 2021-08-22 LAB — SARS CORONAVIRUS 2 (TAT 6-24 HRS): SARS Coronavirus 2: NEGATIVE

## 2021-08-22 NOTE — Progress Notes (Addendum)
Spoke with pt by phone and pt aware to arrive 730 am 08-23-2021 and clear liquids until 630 am then npo   covid test done 07-25-28-2022 negative per beverly taavon rn

## 2021-08-23 ENCOUNTER — Ambulatory Visit (HOSPITAL_BASED_OUTPATIENT_CLINIC_OR_DEPARTMENT_OTHER): Payer: 59 | Admitting: Certified Registered"

## 2021-08-23 ENCOUNTER — Other Ambulatory Visit: Payer: Self-pay

## 2021-08-23 ENCOUNTER — Encounter (HOSPITAL_BASED_OUTPATIENT_CLINIC_OR_DEPARTMENT_OTHER): Payer: Self-pay | Admitting: Obstetrics & Gynecology

## 2021-08-23 ENCOUNTER — Encounter (HOSPITAL_BASED_OUTPATIENT_CLINIC_OR_DEPARTMENT_OTHER)
Admission: RE | Disposition: A | Discharge status: Home / Self Care | Payer: Self-pay | Source: Ambulatory Visit | Attending: Obstetrics & Gynecology

## 2021-08-23 ENCOUNTER — Observation Stay (HOSPITAL_BASED_OUTPATIENT_CLINIC_OR_DEPARTMENT_OTHER)
Admission: RE | Admit: 2021-08-23 | Discharge: 2021-08-24 | Disposition: A | Discharge status: Home / Self Care | Payer: 59 | Source: Ambulatory Visit | Attending: Obstetrics & Gynecology | Admitting: Obstetrics & Gynecology

## 2021-08-23 DIAGNOSIS — Z79899 Other long term (current) drug therapy: Secondary | ICD-10-CM | POA: Diagnosis not present

## 2021-08-23 DIAGNOSIS — N950 Postmenopausal bleeding: Secondary | ICD-10-CM | POA: Diagnosis present

## 2021-08-23 DIAGNOSIS — N8502 Endometrial intraepithelial neoplasia [EIN]: Secondary | ICD-10-CM | POA: Diagnosis not present

## 2021-08-23 DIAGNOSIS — J45909 Unspecified asthma, uncomplicated: Secondary | ICD-10-CM | POA: Diagnosis not present

## 2021-08-23 DIAGNOSIS — N8500 Endometrial hyperplasia, unspecified: Secondary | ICD-10-CM | POA: Diagnosis not present

## 2021-08-23 DIAGNOSIS — K219 Gastro-esophageal reflux disease without esophagitis: Secondary | ICD-10-CM | POA: Diagnosis not present

## 2021-08-23 DIAGNOSIS — N8501 Benign endometrial hyperplasia: Secondary | ICD-10-CM | POA: Diagnosis not present

## 2021-08-23 DIAGNOSIS — I10 Essential (primary) hypertension: Secondary | ICD-10-CM | POA: Insufficient documentation

## 2021-08-23 DIAGNOSIS — Z90710 Acquired absence of both cervix and uterus: Secondary | ICD-10-CM | POA: Diagnosis not present

## 2021-08-23 DIAGNOSIS — E785 Hyperlipidemia, unspecified: Secondary | ICD-10-CM | POA: Diagnosis not present

## 2021-08-23 HISTORY — DX: Pneumonia, unspecified organism: J18.9

## 2021-08-23 HISTORY — DX: Presence of spectacles and contact lenses: Z97.3

## 2021-08-23 HISTORY — DX: Endometrial intraepithelial neoplasia (EIN): N85.02

## 2021-08-23 HISTORY — PX: LAPAROSCOPIC VAGINAL HYSTERECTOMY WITH SALPINGECTOMY: SHX6680

## 2021-08-23 HISTORY — DX: Personal history of gestational diabetes: Z86.32

## 2021-08-23 LAB — TYPE AND SCREEN
ABO/RH(D): AB POS
Antibody Screen: NEGATIVE

## 2021-08-23 LAB — BASIC METABOLIC PANEL
Anion gap: 10 (ref 5–15)
BUN: 17 mg/dL (ref 6–20)
CO2: 30 mmol/L (ref 22–32)
Calcium: 8.7 mg/dL — ABNORMAL LOW (ref 8.9–10.3)
Chloride: 97 mmol/L — ABNORMAL LOW (ref 98–111)
Creatinine, Ser: 0.76 mg/dL (ref 0.44–1.00)
GFR, Estimated: >60 mL/min (ref >60)
Glucose, Bld: 148 mg/dL — ABNORMAL HIGH (ref 70–99)
Potassium: 3.5 mmol/L (ref 3.5–5.1)
Sodium: 137 mmol/L (ref 135–145)

## 2021-08-23 LAB — POCT PREGNANCY, URINE: Preg Test, Ur: NEGATIVE

## 2021-08-23 SURGERY — HYSTERECTOMY, VAGINAL, LAPAROSCOPY-ASSISTED, WITH SALPINGECTOMY
Anesthesia: General | Site: Abdomen | Laterality: Bilateral

## 2021-08-23 MED ORDER — PANTOPRAZOLE SODIUM 40 MG PO TBEC
40.0000 mg | DELAYED_RELEASE_TABLET | Freq: Every day | ORAL | Status: DC
  Administered 2021-08-23: 40 mg via ORAL

## 2021-08-23 MED ORDER — OXYCODONE HCL 5 MG/5ML PO SOLN: 5.0000 mg | Freq: Once | ORAL | Status: DC | PRN

## 2021-08-23 MED ORDER — MIDAZOLAM HCL 2 MG/2ML IJ SOLN
INTRAMUSCULAR | Status: AC
  Filled 2021-08-23: qty 2

## 2021-08-23 MED ORDER — DOCUSATE SODIUM 100 MG PO CAPS
100.0000 mg | ORAL_CAPSULE | Freq: Two times a day (BID) | ORAL | Status: DC
  Administered 2021-08-23 – 2021-08-24 (×2): 100 mg via ORAL

## 2021-08-23 MED ORDER — ONDANSETRON HCL 4 MG/2ML IJ SOLN
INTRAMUSCULAR | Status: DC | PRN
  Administered 2021-08-23: 4 mg via INTRAVENOUS

## 2021-08-23 MED ORDER — ONDANSETRON HCL 4 MG PO TABS: 4.0000 mg | ORAL_TABLET | Freq: Four times a day (QID) | ORAL | Status: DC | PRN

## 2021-08-23 MED ORDER — CHLORTHALIDONE 25 MG PO TABS
25.0000 mg | ORAL_TABLET | Freq: Every day | ORAL | Status: DC
  Filled 2021-08-23: qty 1

## 2021-08-23 MED ORDER — SIMETHICONE 80 MG PO CHEW: 80.0000 mg | CHEWABLE_TABLET | Freq: Four times a day (QID) | ORAL | Status: DC | PRN

## 2021-08-23 MED ORDER — BISACODYL 10 MG RE SUPP: 10.0000 mg | Freq: Every day | RECTAL | Status: DC | PRN

## 2021-08-23 MED ORDER — KETOROLAC TROMETHAMINE 30 MG/ML IJ SOLN: 30.0000 mg | Freq: Once | INTRAMUSCULAR | Status: DC | PRN

## 2021-08-23 MED ORDER — IBUPROFEN 200 MG PO TABS: 600.0000 mg | ORAL_TABLET | Freq: Four times a day (QID) | ORAL | Status: DC

## 2021-08-23 MED ORDER — SENNOSIDES-DOCUSATE SODIUM 8.6-50 MG PO TABS
1.0000 | ORAL_TABLET | Freq: Every evening | ORAL | Status: DC | PRN
  Filled 2021-08-23: qty 1

## 2021-08-23 MED ORDER — LIDOCAINE HCL (PF) 2 % IJ SOLN
INTRAMUSCULAR | Status: AC
  Filled 2021-08-23: qty 5

## 2021-08-23 MED ORDER — GABAPENTIN 100 MG PO CAPS
ORAL_CAPSULE | ORAL | Status: AC
  Filled 2021-08-23: qty 1

## 2021-08-23 MED ORDER — PROPOFOL 10 MG/ML IV BOLUS
INTRAVENOUS | Status: AC
  Filled 2021-08-23: qty 20

## 2021-08-23 MED ORDER — FENTANYL CITRATE (PF) 250 MCG/5ML IJ SOLN
INTRAMUSCULAR | Status: AC
  Filled 2021-08-23: qty 5

## 2021-08-23 MED ORDER — LACTATED RINGERS IV SOLN
INTRAVENOUS | Status: DC
  Administered 2021-08-23: 1 mL via INTRAVENOUS

## 2021-08-23 MED ORDER — SODIUM CHLORIDE 0.9 % IR SOLN
Status: DC | PRN
  Administered 2021-08-23: 1000 mL

## 2021-08-23 MED ORDER — PANTOPRAZOLE SODIUM 40 MG PO TBEC
DELAYED_RELEASE_TABLET | ORAL | Status: AC
  Filled 2021-08-23: qty 1

## 2021-08-23 MED ORDER — KETOROLAC TROMETHAMINE 30 MG/ML IJ SOLN
30.0000 mg | Freq: Four times a day (QID) | INTRAMUSCULAR | Status: DC
  Administered 2021-08-23 – 2021-08-24 (×3): 30 mg via INTRAVENOUS

## 2021-08-23 MED ORDER — LISINOPRIL 10 MG PO TABS
10.0000 mg | ORAL_TABLET | Freq: Every day | ORAL | Status: DC
  Administered 2021-08-23: 10 mg via ORAL
  Filled 2021-08-23: qty 1

## 2021-08-23 MED ORDER — PROMETHAZINE HCL 25 MG/ML IJ SOLN: 6.2500 mg | INTRAMUSCULAR | Status: DC | PRN

## 2021-08-23 MED ORDER — ONDANSETRON HCL 4 MG/2ML IJ SOLN
4.0000 mg | Freq: Four times a day (QID) | INTRAMUSCULAR | Status: DC | PRN
  Administered 2021-08-24: 4 mg via INTRAVENOUS

## 2021-08-23 MED ORDER — ROCURONIUM BROMIDE 10 MG/ML (PF) SYRINGE
PREFILLED_SYRINGE | INTRAVENOUS | Status: AC
  Filled 2021-08-23: qty 10

## 2021-08-23 MED ORDER — HYDROMORPHONE HCL 1 MG/ML IJ SOLN: 0.2500 mg | INTRAMUSCULAR | Status: DC | PRN

## 2021-08-23 MED ORDER — GABAPENTIN 100 MG PO CAPS
100.0000 mg | ORAL_CAPSULE | Freq: Two times a day (BID) | ORAL | Status: DC
  Administered 2021-08-23 – 2021-08-24 (×2): 100 mg via ORAL

## 2021-08-23 MED ORDER — ACETAMINOPHEN 500 MG PO TABS
ORAL_TABLET | ORAL | Status: AC
  Filled 2021-08-23: qty 2

## 2021-08-23 MED ORDER — BUPIVACAINE HCL (PF) 0.5 % IJ SOLN
INTRAMUSCULAR | Status: DC | PRN
  Administered 2021-08-23: 7 mL

## 2021-08-23 MED ORDER — OXYCODONE HCL 5 MG PO TABS: 5.0000 mg | ORAL_TABLET | Freq: Once | ORAL | Status: DC | PRN

## 2021-08-23 MED ORDER — ACETAMINOPHEN 500 MG PO TABS
1000.0000 mg | ORAL_TABLET | Freq: Four times a day (QID) | ORAL | Status: DC
  Administered 2021-08-23 – 2021-08-24 (×3): 1000 mg via ORAL

## 2021-08-23 MED ORDER — LACTATED RINGERS IV SOLN: INTRAVENOUS | Status: DC

## 2021-08-23 MED ORDER — GABAPENTIN 300 MG PO CAPS
ORAL_CAPSULE | ORAL | Status: AC
  Filled 2021-08-23: qty 1

## 2021-08-23 MED ORDER — GABAPENTIN 300 MG PO CAPS
300.0000 mg | ORAL_CAPSULE | ORAL | Status: AC
  Administered 2021-08-23: 300 mg via ORAL

## 2021-08-23 MED ORDER — KETOROLAC TROMETHAMINE 30 MG/ML IJ SOLN
INTRAMUSCULAR | Status: DC | PRN
  Administered 2021-08-23: 30 mg via INTRAVENOUS

## 2021-08-23 MED ORDER — LIDOCAINE 2% (20 MG/ML) 5 ML SYRINGE
INTRAMUSCULAR | Status: DC | PRN
  Administered 2021-08-23: 1.5 mg/kg/h via INTRAVENOUS

## 2021-08-23 MED ORDER — KETOROLAC TROMETHAMINE 30 MG/ML IJ SOLN
INTRAMUSCULAR | Status: AC
  Filled 2021-08-23: qty 1

## 2021-08-23 MED ORDER — ROCURONIUM BROMIDE 10 MG/ML (PF) SYRINGE
PREFILLED_SYRINGE | INTRAVENOUS | Status: DC | PRN
  Administered 2021-08-23: 70 mg via INTRAVENOUS
  Administered 2021-08-23: 10 mg via INTRAVENOUS

## 2021-08-23 MED ORDER — ALUM & MAG HYDROXIDE-SIMETH 200-200-20 MG/5ML PO SUSP: 30.0000 mL | ORAL | Status: DC | PRN

## 2021-08-23 MED ORDER — HEMOSTATIC AGENTS (NO CHARGE) OPTIME
TOPICAL | Status: DC | PRN
  Administered 2021-08-23: 1 via TOPICAL

## 2021-08-23 MED ORDER — KETAMINE HCL 50 MG/5ML IJ SOSY
PREFILLED_SYRINGE | INTRAMUSCULAR | Status: AC
  Filled 2021-08-23: qty 5

## 2021-08-23 MED ORDER — HYDROMORPHONE HCL 1 MG/ML IJ SOLN: 0.5000 mg | INTRAMUSCULAR | Status: DC | PRN

## 2021-08-23 MED ORDER — ZOLPIDEM TARTRATE 5 MG PO TABS: 5.0000 mg | ORAL_TABLET | Freq: Every evening | ORAL | Status: DC | PRN

## 2021-08-23 MED ORDER — OXYCODONE HCL 5 MG PO TABS
5.0000 mg | ORAL_TABLET | ORAL | Status: DC | PRN
  Administered 2021-08-23 – 2021-08-24 (×2): 10 mg via ORAL

## 2021-08-23 MED ORDER — 0.9 % SODIUM CHLORIDE (POUR BTL) OPTIME
TOPICAL | Status: DC | PRN
  Administered 2021-08-23: 1000 mL

## 2021-08-23 MED ORDER — PROPOFOL 10 MG/ML IV BOLUS
INTRAVENOUS | Status: DC | PRN
  Administered 2021-08-23: 180 mg via INTRAVENOUS

## 2021-08-23 MED ORDER — SODIUM CHLORIDE 0.9 % IV SOLN
INTRAVENOUS | Status: AC
  Filled 2021-08-23: qty 2

## 2021-08-23 MED ORDER — SODIUM CHLORIDE 0.9 % IV SOLN
2.0000 g | INTRAVENOUS | Status: AC
  Administered 2021-08-23: 2 g via INTRAVENOUS

## 2021-08-23 MED ORDER — OXYCODONE HCL 5 MG PO TABS
ORAL_TABLET | ORAL | Status: AC
  Filled 2021-08-23: qty 2

## 2021-08-23 MED ORDER — DEXAMETHASONE SODIUM PHOSPHATE 10 MG/ML IJ SOLN
INTRAMUSCULAR | Status: AC
  Filled 2021-08-23: qty 1

## 2021-08-23 MED ORDER — SUGAMMADEX SODIUM 200 MG/2ML IV SOLN
INTRAVENOUS | Status: DC | PRN
  Administered 2021-08-23: 200 mg via INTRAVENOUS

## 2021-08-23 MED ORDER — DOCUSATE SODIUM 100 MG PO CAPS
ORAL_CAPSULE | ORAL | Status: AC
  Filled 2021-08-23: qty 1

## 2021-08-23 MED ORDER — MENTHOL 3 MG MT LOZG: 1.0000 | LOZENGE | OROMUCOSAL | Status: DC | PRN

## 2021-08-23 MED ORDER — FENTANYL CITRATE (PF) 250 MCG/5ML IJ SOLN
INTRAMUSCULAR | Status: DC | PRN
  Administered 2021-08-23: 100 ug via INTRAVENOUS
  Administered 2021-08-23 (×3): 50 ug via INTRAVENOUS

## 2021-08-23 MED ORDER — ACETAMINOPHEN 500 MG PO TABS
1000.0000 mg | ORAL_TABLET | ORAL | Status: AC
  Administered 2021-08-23: 1000 mg via ORAL

## 2021-08-23 MED ORDER — BUPIVACAINE-EPINEPHRINE 0.5% -1:200000 IJ SOLN
INTRAMUSCULAR | Status: DC | PRN
  Administered 2021-08-23: 10 mL

## 2021-08-23 MED ORDER — POVIDONE-IODINE 10 % EX SWAB: 2.0000 "application " | Freq: Once | CUTANEOUS | Status: DC

## 2021-08-23 MED ORDER — LIDOCAINE 2% (20 MG/ML) 5 ML SYRINGE
INTRAMUSCULAR | Status: DC | PRN
  Administered 2021-08-23: 80 mg via INTRAVENOUS

## 2021-08-23 MED ORDER — DEXAMETHASONE SODIUM PHOSPHATE 10 MG/ML IJ SOLN
INTRAMUSCULAR | Status: DC | PRN
  Administered 2021-08-23: 10 mg via INTRAVENOUS

## 2021-08-23 MED ORDER — ONDANSETRON HCL 4 MG/2ML IJ SOLN
INTRAMUSCULAR | Status: AC
  Filled 2021-08-23: qty 2

## 2021-08-23 MED ORDER — MIDAZOLAM HCL 5 MG/5ML IJ SOLN
INTRAMUSCULAR | Status: DC | PRN
  Administered 2021-08-23: .5 mg via INTRAVENOUS

## 2021-08-23 MED ORDER — KETAMINE HCL 10 MG/ML IJ SOLN
INTRAMUSCULAR | Status: DC | PRN
  Administered 2021-08-23: 30 mg via INTRAVENOUS

## 2021-08-23 SURGICAL SUPPLY — 58 items
ADH SKN CLS APL DERMABOND .7 (GAUZE/BANDAGES/DRESSINGS) ×1
APL SRG 38 LTWT LNG FL B (MISCELLANEOUS) ×1
APL SWBSTK 6 STRL LF DISP (MISCELLANEOUS) ×1
APPLICATOR ARISTA FLEXITIP XL (MISCELLANEOUS) ×1 IMPLANT
APPLICATOR COTTON TIP 6 STRL (MISCELLANEOUS) ×1 IMPLANT
APPLICATOR COTTON TIP 6IN STRL (MISCELLANEOUS) ×2
BAG COUNTER SPONGE SURGICOUNT (BAG) ×2 IMPLANT
BAG SPNG CNTER NS LX DISP (BAG) ×1
CABLE HIGH FREQUENCY MONO STRZ (ELECTRODE) IMPLANT
CANISTER SUCT 3000ML PPV (MISCELLANEOUS) ×2 IMPLANT
COVER BACK TABLE 60X90IN (DRAPES) ×2 IMPLANT
COVER MAYO STAND STRL (DRAPES) ×2 IMPLANT
DECANTER SPIKE VIAL GLASS SM (MISCELLANEOUS) ×4 IMPLANT
DERMABOND ADVANCED (GAUZE/BANDAGES/DRESSINGS) ×1
DERMABOND ADVANCED .7 DNX12 (GAUZE/BANDAGES/DRESSINGS) IMPLANT
DRSG OPSITE POSTOP 3X4 (GAUZE/BANDAGES/DRESSINGS) ×2 IMPLANT
DURAPREP 26ML APPLICATOR (WOUND CARE) ×2 IMPLANT
ELECT REM PT RETURN 9FT ADLT (ELECTROSURGICAL)
ELECTRODE REM PT RTRN 9FT ADLT (ELECTROSURGICAL) IMPLANT
FILTER SMOKE EVAC LAPAROSHD (FILTER) IMPLANT
GAUZE 4X4 16PLY ~~LOC~~+RFID DBL (SPONGE) ×3 IMPLANT
GLOVE SURG LTX SZ7 (GLOVE) ×4 IMPLANT
GLOVE SURG LTX SZ7.5 (GLOVE) ×1 IMPLANT
GLOVE SURG POLYISO LF SZ6.5 (GLOVE) ×1 IMPLANT
GLOVE SURG UNDER POLY LF SZ6.5 (GLOVE) ×2 IMPLANT
GLOVE SURG UNDER POLY LF SZ7 (GLOVE) ×11 IMPLANT
GOWN STRL REUS W/TWL LRG LVL3 (GOWN DISPOSABLE) ×1 IMPLANT
HEMOSTAT ARISTA ABSORB 3G PWDR (HEMOSTASIS) ×1 IMPLANT
KIT TURNOVER CYSTO (KITS) ×2 IMPLANT
LEGGING LITHOTOMY PAIR STRL (DRAPES) ×2 IMPLANT
NDL INSUFFLATION 14GA 120MM (NEEDLE) IMPLANT
NDL MAYO CATGUT SZ4 TPR NDL (NEEDLE) IMPLANT
NEEDLE INSUFFLATION 14GA 120MM (NEEDLE) IMPLANT
NEEDLE MAYO CATGUT SZ4 (NEEDLE) IMPLANT
NS IRRIG 1000ML POUR BTL (IV SOLUTION) ×2 IMPLANT
PACK LAVH (CUSTOM PROCEDURE TRAY) ×2 IMPLANT
PACK ROBOTIC GOWN (GOWN DISPOSABLE) ×2 IMPLANT
PACK TRENDGUARD 450 HYBRID PRO (MISCELLANEOUS) IMPLANT
PACK TRENDGUARD 600 HYBRD PROC (MISCELLANEOUS) IMPLANT
PROTECTOR NERVE ULNAR (MISCELLANEOUS) ×4 IMPLANT
SET IRRIG TUBING LAPAROSCOPIC (IRRIGATION / IRRIGATOR) IMPLANT
SET SUCTION IRRIG HYDROSURG (IRRIGATION / IRRIGATOR) ×1 IMPLANT
SET TUBE SMOKE EVAC HIGH FLOW (TUBING) ×2 IMPLANT
SHEARS HARMONIC ACE PLUS 36CM (ENDOMECHANICALS) IMPLANT
SUT VIC AB 0 CT1 18XCR BRD8 (SUTURE) ×2 IMPLANT
SUT VIC AB 0 CT1 36 (SUTURE) ×2 IMPLANT
SUT VIC AB 0 CT1 8-18 (SUTURE) ×4
SUT VICRYL 0 TIES 12 18 (SUTURE) ×2 IMPLANT
SUT VICRYL 0 UR6 27IN ABS (SUTURE) ×2 IMPLANT
SUT VICRYL 4-0 PS2 18IN ABS (SUTURE) ×4 IMPLANT
TOWEL OR 17X26 10 PK STRL BLUE (TOWEL DISPOSABLE) ×6 IMPLANT
TRAY FOLEY W/BAG SLVR 14FR (SET/KITS/TRAYS/PACK) ×2 IMPLANT
TRENDGUARD 450 HYBRID PRO PACK (MISCELLANEOUS)
TRENDGUARD 600 HYBRID PROC PK (MISCELLANEOUS)
TROCAR XCEL NON-BLD 11X100MML (ENDOMECHANICALS) IMPLANT
TROCAR XCEL NON-BLD 5MMX100MML (ENDOMECHANICALS) ×4 IMPLANT
UNDERPAD 30X36 HEAVY ABSORB (UNDERPADS AND DIAPERS) ×2 IMPLANT
WARMER LAPAROSCOPE (MISCELLANEOUS) ×2 IMPLANT

## 2021-08-23 NOTE — Anesthesia Procedure Notes (Addendum)
Procedure Name: Intubation Date/Time: 08/23/2021 10:52 AM Performed by: Ashonti Leandro, Clinical cytogeneticist D, CRNA Pre-anesthesia Checklist: Patient identified, Emergency Drugs available, Suction available and Patient being monitored Patient Re-evaluated:Patient Re-evaluated prior to induction Oxygen Delivery Method: Circle system utilized Preoxygenation: Pre-oxygenation with 100% oxygen Induction Type: IV induction Ventilation: Mask ventilation without difficulty Laryngoscope Size: Mac and 4 Grade View: Grade II Tube type: Oral Number of attempts: 1 Airway Equipment and Method: Stylet and Oral airway Placement Confirmation: ETT inserted through vocal cords under direct vision, positive ETCO2 and breath sounds checked- equal and bilateral Secured at: 7 cm Tube secured with: Tape Dental Injury: Teeth and Oropharynx as per pre-operative assessment

## 2021-08-23 NOTE — H&P (Signed)
Preoperative History and Physical  [name] is a 60 y.o. [id number] here for surgical management of complex endometrial hyperplasia without atypia, diagnosed on endometrial biopsy done for postmenopausal bleeding. Discussed diagnosis with Dr. Berline Lopes (GYN ONC), she felt that my decision to proceed with hysterectomy as management was appropriate, no need for Dilation and Curettage procedure.  This was discussed with patient who agreed with plan.   No significant preoperative concerns.  Proposed surgery: Laparoscopic assisted vaginal hysterectomy, bilateral salpingectomy, possible oophorectomy.  Past Medical History:  Diagnosis Date   Arthritis    rheumatoid arthritis   Asthma    as a child    GERD (gastroesophageal reflux disease)    Helicobacter pylori antibody positive 2006   Tx w/ PPI, amoxicillin and Biaxin   Hemorrhoids    History of gestational diabetes    yrs ago with 1st pregnancy   Hyperlipidemia    Hypertension    Obesity    PMB (postmenopausal bleeding) 08/15/2021   Pneumonia    as child   Psoriasis 08/15/2021   PVC's (premature ventricular contractions)    occasional R/O cardiac via [place] yrs ago   Rheumatoid arthritis ([place])    Wears glasses    or contacts   Past Surgical History:  Procedure Laterality Date   CERVICAL POLYPECTOMY  12/22/2012   Procedure: CERVICAL POLYPECTOMY;  Surgeon: Thurnell Lose, MD;  Location: [facility];  Service: Gynecology;  Laterality: N/A;   CESAREAN SECTION     x 2 1999 and 2001   COLONOSCOPY     colonscopy and endoscopy  08/05/2020   HYSTEROSCOPY WITH D & C  12/22/2012   Procedure: DILATATION AND CURETTAGE /HYSTEROSCOPY;  Surgeon: Thurnell Lose, MD;  Location: [facility];  Service: Gynecology;;   PLANTAR'S WART EXCISION     8 yrs ago per pt on 08-15-2021   OB History  Gravida Para Term Preterm AB Living  '2 2 2 '$ 0 0 2  SAB IAB Ectopic Multiple Live Births  0 0 0 0 2    # Outcome Date GA Lbr Len/2nd Weight Sex Delivery Anes PTL  Lv  2 Term 12/29/99 [redacted]w[redacted]d 4252 g F CS-LTranv Spinal  LIV  1 Term 02/07/98 [id number] g M CS-LTranv EPI  LIV     Complications: Failure to Progress in Second Stage  Patient denies any other pertinent gynecologic issues.   No current facility-administered medications on file prior to encounter.   Current Outpatient Medications on File Prior to Encounter  Medication Sig Dispense Refill   acetaminophen (TYLENOL) 325 MG tablet Take 650 mg by mouth as needed.     calcium carbonate (TUMS - DOSED IN MG ELEMENTAL CALCIUM) 500 MG chewable tablet Chew 1 tablet by mouth as needed.     chlorthalidone (HYGROTON) 25 MG tablet TAKE 1 TABLET BY MOUTH EVERY MORNING WITH FOOD 90 tablet 3   Cholecalciferol (VITAMIN D) 2000 UNITS CAPS Take 1 capsule by mouth daily.     Fexofenadine HCl (ALLEGRA PO) Take by mouth daily. Alternates with certrizine     fluticasone (FLONASE) 50 MCG/ACT nasal spray Place into both nostrils daily.     folic acid (FOLVITE) 1 MG tablet Take 2 tablets (2 mg total) by mouth daily. 180 tablet 3   levocetirizine (XYZAL) 5 MG tablet Take 5 mg by mouth every evening.     lisinopril (ZESTRIL) 20 MG tablet TAKE 1/2 TABLET BY MOUTH DAILY 45 tablet 3   meloxicam (MOBIC) 15 MG tablet TAKE  1 TABLET BY MOUTH ONCE A DAY (Patient taking differently: Take by mouth daily. Takes 1/2 tab) 90 tablet 1   methotrexate 2.5 MG tablet Take 8 tablets by mouth once weekly 96 tablet 0   Multiple Vitamin (MULTIVITAMIN) tablet Take 1 tablet by mouth daily.     Ascorbic Acid (VITA-C PO) Take by mouth.     cetirizine (ZYRTEC) 10 MG tablet Take 10 mg by mouth daily. Alternates with allegra     fluconazole (DIFLUCAN) 150 MG tablet Take 1 tablet by mouth once * May repeat weekly as needed for yeast infection 12 tablet 1   pantoprazole (PROTONIX) 20 MG tablet Take 1 tablet (20 mg total) by mouth daily before breakfast. (Patient taking differently: Take 20 mg by mouth as needed.) 90 tablet 3   Allergies  Allergen  Reactions   Amlodipine Swelling and Other (See Comments)    petichane   Hydroxychloroquine Nausea And Vomiting    Social History:   reports that she has never smoked. She has never used smokeless tobacco. She reports current alcohol use. She reports that she does not use drugs.  Family History  Problem Relation Age of Onset   Diabetes Brother        x 2    Colon cancer Neg Hx     Review of Systems: Pertinent items noted in HPI and remainder of comprehensive ROS otherwise negative.  PHYSICAL EXAM: Blood pressure (!) 153/74, pulse 95, temperature 97.7 F (36.5 C), temperature source Oral, resp. rate 18, height '5\' 7"'$  (1.702 m), weight 112 kg, last menstrual period 03/29/2020, SpO2 99 %. CONSTITUTIONAL: Well-developed, well-nourished female in no acute distress.  HENT:  Normocephalic, atraumatic, External right and left ear normal. Oropharynx is clear and moist EYES: Conjunctivae and EOM are normal. Pupils are equal, round, and reactive to light. No scleral icterus.  NECK: Normal range of motion, supple, no masses SKIN: Skin is warm and dry. No rash noted. Not diaphoretic. No erythema. No pallor. NEUROLOGIC: Alert and oriented to person, place, and time. Normal reflexes, muscle tone coordination. No cranial nerve deficit noted. PSYCHIATRIC: Normal mood and affect. Normal behavior. Normal judgment and thought content. CARDIOVASCULAR: Normal heart rate noted, regular rhythm RESPIRATORY: Effort and breath sounds normal, no problems with respiration noted ABDOMEN: Soft, nontender, nondistended. Well -healed Pfannenstiel incisions. PELVIC: Deferred MUSCULOSKELETAL: Normal range of motion. No edema and no tenderness. 2+ distal pulses.  Labs: Results for orders placed or performed during the hospital encounter of 08/23/21 (from the past 336 hour(s))  Pregnancy, urine POC   Collection Time: 08/23/21  7:52 AM  Result Value Ref Range   Preg Test, Ur NEGATIVE NEGATIVE  Results for orders  placed or performed in visit on 08/21/21 (from the past 336 hour(s))  SARS Coronavirus 2 (TAT 6-24 hrs)   Collection Time: 08/21/21 12:00 AM  Result Value Ref Range   SARS Coronavirus 2 RESULT: NEGATIVE   Results for orders placed or performed during the hospital encounter of 08/21/21 (from the past 336 hour(s))  CBC   Collection Time: 08/21/21  1:09 PM  Result Value Ref Range   WBC 7.7 4.0 - 10.5 K/uL   RBC 4.51 3.87 - 5.11 MIL/uL   Hemoglobin 14.0 12.0 - 15.0 g/dL   HCT 42.1 36.0 - 46.0 %   MCV 93.3 80.0 - 100.0 fL   MCH 31.0 26.0 - 34.0 pg   MCHC 33.3 30.0 - 36.0 g/dL   RDW 14.5 11.5 - 15.5 %   Platelets 298  150 - 400 K/uL   nRBC 0.0 0.0 - 0.2 %  Basic metabolic panel   Collection Time: 08/21/21  1:09 PM  Result Value Ref Range   Sodium 139 135 - 145 mmol/L   Potassium 4.7 3.5 - 5.1 mmol/L   Chloride 99 98 - 111 mmol/L   CO2 31 22 - 32 mmol/L   Glucose, Bld 154 (H) 70 - 99 mg/dL   BUN 16 6 - 20 mg/dL   Creatinine, Ser 0.65 0.44 - 1.00 mg/dL   Calcium 9.6 8.9 - 10.3 mg/dL   GFR, Estimated >60 >60 mL/min   Anion gap 9 5 - 15  Type and screen [place]   Collection Time: 08/21/21  1:09 PM  Result Value Ref Range   ABO/RH(D) AB POS    Antibody Screen NEG    Sample Expiration 08/26/2021,2359    Extend sample reason      NO TRANSFUSIONS OR PREGNANCY IN THE PAST 3 MONTHS Performed at [facility], [place] [address], [place], [place] [address]     Imaging Studies: US PELVIC COMPLETE WITH TRANSVAGINAL  Result Date: 06/23/2021 CLINICAL DATA:  Postmenopausal bleeding EXAM: TRANSABDOMINAL AND TRANSVAGINAL ULTRASOUND OF PELVIS TECHNIQUE: Both transabdominal and transvaginal ultrasound examinations of the pelvis were performed. Transabdominal technique was performed for global imaging of the pelvis including uterus, ovaries, adnexal regions, and pelvic cul-de-sac. It was necessary to proceed with endovaginal exam following the transabdominal exam to  visualize the ovaries and endometrium. COMPARISON:  None FINDINGS: Uterus Measurements: 9.2 x 4.1 x 5.0 cm. = volume: 115 mL. Heterogeneous predominantly hyperechoic lesion is identified in the anterior aspect of the uterus near the fundus measuring 3.1 cm in greatest dimension. This is felt to represent a uterine fibroid. Endometrium Thickness: 8.6 mm.  No focal abnormality visualized. Right ovary Not well visualized Left ovary Not well visualized Other findings No abnormal free fluid. IMPRESSION: Hyperechoic uterine lesion as described most consistent with uterine fibroid. No other focal abnormality is noted. Electronically Signed   By: Inez Catalina M.D.   On: 06/23/2021 10:39    Assessment: Patient Active Problem List   Diagnosis Date Noted   Complex endometrial hyperplasia without atypia 07/10/2021   Postmenopausal bleeding 06/23/2021   Rheumatoid arthritis(714.0) 01/22/2012   Hypertension 01/22/2012    Plan: Patient will undergo surgical management with laparoscopic assisted vaginal hysterectomy, bilateral salpingectomy, possible oophorectomy.  The risks of surgery were discussed in detail with the patient including but not limited to: bleeding which may require transfusion or reoperation; infection which may require antibiotics; injury to surrounding organs which may involve bowel, bladder, ureters; need for additional procedures including laparotomy or subsequent procedures secondary to abnormal pathology; thromboembolic phenomenon, surgical site problems and other postoperative/anesthesia complications. Likelihood of success in alleviating the patient's condition was discussed. Routine postoperative instructions will be reviewed with the patient and her family in detail after surgery.  The patient concurred with the proposed plan, giving informed written consent for the surgery.  Patient has been NPO since last night and she will remain NPO for procedure.  Anesthesia and OR aware.  Preoperative  prophylactic antibiotics and SCDs ordered on call to the OR.  To OR when ready.    Verita Schneiders, MD, [place] for [employer], [place]

## 2021-08-23 NOTE — Op Note (Addendum)
[name] PROCEDURE DATE: 08/23/2021   PREOPERATIVE DIAGNOSES: Complex endometrial hyperplasia without atypia, postmenopausal bleeding, history of two previous cesarean sections POSTOPERATIVE DIAGNOSES: The same PROCEDURE: Laparoscopic assisted vaginal hysterectomy, bilateral salpingectomy SURGEON:  Dr. Verita Schneiders ASSISTANT:  Dr. Arlina Robes. An experienced assistant was required given the standard of surgical care given the complexity of the case.  This assistant was needed for exposure, dissection, suctioning, retraction, instrument exchange, and for overall help during the procedure.  INDICATIONS: 60 y.o. [id number] with aforementioned preoperative diagnoses here today for definitive surgical management.   Risks of surgery were discussed with the patient including but not limited to: bleeding which may require transfusion or reoperation; infection which may require antibiotics; injury to bowel, bladder, ureters or other surrounding organs; need for additional procedures including laparotomy; thromboembolic phenomenon, incisional problems and other postoperative/anesthesia complications. Written informed consent was obtained.    FINDINGS:  Small uterus, normal adnexa bilaterally. Moderate adhesive disease involving vesicouterine peritoneum given surgical history.  No evidence of endometriosis, adhesions or any other abdominal/pelvic abnormality.  Normal upper abdomen.  ANESTHESIA:    General ESTIMATED BLOOD LOSS: 300 ml SPECIMENS: Uterus, cervix, bilateral fallopian tubes. COMPLICATIONS: None immediate  PROCEDURE IN DETAIL:  The patient received intravenous antibiotics and had sequential compression devices applied to her lower extremities while in the preoperative area.  She was then taken to the operating room where general anesthesia was administered and was found to be adequate.  She was placed in the dorsal lithotomy position, and was prepped and draped in a sterile manner.  A Foley  catheter was inserted into her bladder and attached to constant drainage and a uterine manipulator was then advanced into the uterus .  After an adequate timeout was performed, attention was turned to the abdomen where an umbilical incision was made with the scalpel.  The Optiview 5-mm trocar and sleeve were then advanced without difficulty with the laparoscope under direct visualization into the abdomen.  The abdomen was then insufflated with carbon dioxide gas and adequate pneumoperitoneum was obtained. Bilateral 5-mm lower quadrant ports were then placed under direct visualization.  A survey of the patient's pelvis and abdomen revealed the findings as above. The fallopian tubes were freed from the underlying mesosalpinx with the Harmonic device, and they were left attached to the uterus. The round ligaments were then clamped and transected.  Excellent hemostasis was noted, and the decision was made to leave the trocars in place and proceed with completing the hysterectomy via the vaginal route.  Attention was then turned to her pelvis.  A weighted speculum was then placed in the vagina, and the anterior and posterior lips of the cervix were grasped bilaterally with tenaculums.  The cervix was then injected circumferentially with 0.5% Marcaine with epinephrine solution.  The cervix was then circumferentially incised, and the posterior cul-de-sac was entered sharply without difficulty and a retractor was placed.  A long weighted speculum was inserted into the posterior cul-de-sac.  The Heaney clamp was then used to clamp the uterosacral ligaments on either side.  They were then cut and sutured ligated with 0 Vicryl, and were held with a tag for later identification. Of note, all sutures used in this case were 0 Vicryl unless otherwise noted.  The cardinal ligaments were then clamped, cut and ligated bilaterally. The uterine vessels and broad ligaments were then serially clamped with the Heaney clamps, cut, and  suture ligated on both sides. The anterior cul-de-sac was then entered sharply with some difficulty  given the adhesive disease.    The adnexal ligaments were clamped, cut and ligated bilaterally.  The uterus was noted to be freed from all ligaments and was then delivered and sent to pathology.   After completion of the hysterectomy, all pedicles from the uterosacral ligament to the cornua were examined. There were some bleeders noted, controlled with ligation. Good hemostasis was confirmed.  The peritoneum layer was then closed in a pursetring fashion, and the vaginal cuff was then closed in a running locked fashion with 0 Vicryl with care given to incorporate the uterosacral pedicles bilaterally.  All instruments were then removed from the pelvis.  Attention was then returned to her abdomen which was insufflated again with carbon dioxide gas.  The laparoscope was used to survey the operative site, and it was found to be hemostatic.   No intraoperative injury to other surrounding organs was noted.  The abdomen was desufflated and all instruments were then removed from the patient's abdomen. All skin incisions were closed with 4-0 Vicryl subcuticular stitches and Dermabond. The patient tolerated the procedures well.  All instruments, needles, and sponge counts were correct x 3. The patient was taken to the recovery room awake, extubated and in stable condition.    Verita Schneiders, MD, [place] for [employer], [place]

## 2021-08-23 NOTE — Anesthesia Preprocedure Evaluation (Signed)
Anesthesia Evaluation  Patient identified by MRN, date of birth, ID band Patient awake    Reviewed: Allergy & Precautions, NPO status , Patient's Chart, lab work & pertinent test results  Airway Mallampati: II  TM Distance: >3 FB Neck ROM: Full    Dental no notable dental hx.    Pulmonary neg pulmonary ROS,    Pulmonary exam normal breath sounds clear to auscultation       Cardiovascular hypertension, Normal cardiovascular exam Rhythm:Regular Rate:Normal     Neuro/Psych negative neurological ROS  negative psych ROS   GI/Hepatic negative GI ROS, Neg liver ROS,   Endo/Other  Morbid obesity  Renal/GU negative Renal ROS  negative genitourinary   Musculoskeletal  (+) Arthritis , Rheumatoid disorders,    Abdominal   Peds negative pediatric ROS (+)  Hematology negative hematology ROS (+)   Anesthesia Other Findings   Reproductive/Obstetrics negative OB ROS                             Anesthesia Physical Anesthesia Plan  ASA: 3  Anesthesia Plan: General   Post-op Pain Management:    Induction: Intravenous  PONV Risk Score and Plan: 3 and Ondansetron, Dexamethasone and Treatment may vary due to age or medical condition  Airway Management Planned: Oral ETT  Additional Equipment:   Intra-op Plan:   Post-operative Plan: Extubation in OR  Informed Consent: I have reviewed the patients History and Physical, chart, labs and discussed the procedure including the risks, benefits and alternatives for the proposed anesthesia with the patient or authorized representative who has indicated his/her understanding and acceptance.     Dental advisory given  Plan Discussed with: CRNA and Surgeon  Anesthesia Plan Comments:         Anesthesia Quick Evaluation

## 2021-08-23 NOTE — Transfer of Care (Signed)
Immediate Anesthesia Transfer of Care Note  Patient: [name]  Procedure(s) Performed: LAPAROSCOPIC ASSISTED VAGINAL HYSTERECTOMY WITH BILATERAL SALPINGECTOMY (Bilateral: Abdomen)  Patient Location: PACU  Anesthesia Type:General  Level of Consciousness: awake, alert  and oriented  Airway & Oxygen Therapy: Patient Spontanous Breathing and Patient connected to face mask oxygen  Post-op Assessment: Report given to RN and Post -op Vital signs reviewed and stable  Post vital signs: Reviewed and stable  Last Vitals:  Vitals Value Taken Time  BP 148/65 08/23/21 1317  Temp    Pulse 87 08/23/21 1324  Resp 16 08/23/21 1324  SpO2 98 % 08/23/21 1324  Vitals shown include unvalidated device data.  Last Pain:  Vitals:   08/23/21 0808  TempSrc: Oral  PainSc: 0-No pain      Patients Stated Pain Goal: 4 ([id number])  Complications: No notable events documented.

## 2021-08-23 NOTE — Anesthesia Postprocedure Evaluation (Signed)
Anesthesia Post Note  Patient: [name]  Procedure(s) Performed: LAPAROSCOPIC ASSISTED VAGINAL HYSTERECTOMY WITH BILATERAL SALPINGECTOMY (Bilateral: Abdomen)     Patient location during evaluation: PACU Anesthesia Type: General Level of consciousness: awake and alert Pain management: pain level controlled Vital Signs Assessment: post-procedure vital signs reviewed and stable Respiratory status: spontaneous breathing, nonlabored ventilation, respiratory function stable and patient connected to nasal cannula oxygen Cardiovascular status: blood pressure returned to baseline and stable Postop Assessment: no apparent nausea or vomiting Anesthetic complications: no   No notable events documented.  Last Vitals:  Vitals:   08/23/21 0808 08/23/21 1317  BP: (!) 153/74 (!) 148/65  Pulse: 95 88  Resp: 18 13  Temp: 36.5 C   SpO2: 99% 98%    Last Pain:  Vitals:   08/23/21 0808  TempSrc: Oral  PainSc: 0-No pain                 Cullen Lahaie S

## 2021-08-24 ENCOUNTER — Encounter (HOSPITAL_BASED_OUTPATIENT_CLINIC_OR_DEPARTMENT_OTHER): Payer: Self-pay | Admitting: Obstetrics & Gynecology

## 2021-08-24 ENCOUNTER — Other Ambulatory Visit (HOSPITAL_COMMUNITY): Payer: Self-pay

## 2021-08-24 DIAGNOSIS — Z79899 Other long term (current) drug therapy: Secondary | ICD-10-CM | POA: Diagnosis not present

## 2021-08-24 DIAGNOSIS — J45909 Unspecified asthma, uncomplicated: Secondary | ICD-10-CM | POA: Diagnosis not present

## 2021-08-24 DIAGNOSIS — N8500 Endometrial hyperplasia, unspecified: Secondary | ICD-10-CM | POA: Diagnosis not present

## 2021-08-24 DIAGNOSIS — N950 Postmenopausal bleeding: Secondary | ICD-10-CM | POA: Diagnosis not present

## 2021-08-24 DIAGNOSIS — I10 Essential (primary) hypertension: Secondary | ICD-10-CM | POA: Diagnosis not present

## 2021-08-24 LAB — CBC
HCT: 33.5 % — ABNORMAL LOW (ref 36.0–46.0)
Hemoglobin: 11.4 g/dL — ABNORMAL LOW (ref 12.0–15.0)
MCH: 31.1 pg (ref 26.0–34.0)
MCHC: 34 g/dL (ref 30.0–36.0)
MCV: 91.5 fL (ref 80.0–100.0)
Platelets: 251 10*3/uL (ref 150–400)
RBC: 3.66 MIL/uL — ABNORMAL LOW (ref 3.87–5.11)
RDW: 14.3 % (ref 11.5–15.5)
WBC: 11.6 10*3/uL — ABNORMAL HIGH (ref 4.0–10.5)
nRBC: 0 % (ref 0.0–0.2)

## 2021-08-24 MED ORDER — OXYCODONE HCL 5 MG PO TABS
5.0000 mg | ORAL_TABLET | ORAL | 0 refills | Status: DC | PRN
  Filled 2021-08-24: qty 30, 5d supply, fill #0

## 2021-08-24 MED ORDER — ACETAMINOPHEN 500 MG PO TABS
ORAL_TABLET | ORAL | Status: AC
  Filled 2021-08-24: qty 2

## 2021-08-24 MED ORDER — DOCUSATE SODIUM 100 MG PO CAPS
100.0000 mg | ORAL_CAPSULE | Freq: Two times a day (BID) | ORAL | 0 refills | Status: DC
  Filled 2021-08-24: qty 10, 5d supply, fill #0

## 2021-08-24 MED ORDER — GABAPENTIN 100 MG PO CAPS
ORAL_CAPSULE | ORAL | Status: AC
  Filled 2021-08-24: qty 1

## 2021-08-24 MED ORDER — ONDANSETRON 4 MG PO TBDP
4.0000 mg | ORAL_TABLET | Freq: Four times a day (QID) | ORAL | 0 refills | Status: DC | PRN
  Filled 2021-08-24: qty 20, 5d supply, fill #0

## 2021-08-24 MED ORDER — KETOROLAC TROMETHAMINE 30 MG/ML IJ SOLN
INTRAMUSCULAR | Status: AC
  Filled 2021-08-24: qty 1

## 2021-08-24 MED ORDER — IBUPROFEN 600 MG PO TABS
600.0000 mg | ORAL_TABLET | Freq: Four times a day (QID) | ORAL | 0 refills | Status: DC
  Filled 2021-08-24: qty 30, 8d supply, fill #0

## 2021-08-24 MED ORDER — ONDANSETRON HCL 4 MG/2ML IJ SOLN
INTRAMUSCULAR | Status: AC
  Filled 2021-08-24: qty 2

## 2021-08-24 MED ORDER — OXYCODONE HCL 5 MG PO TABS
ORAL_TABLET | ORAL | Status: AC
  Filled 2021-08-24: qty 2

## 2021-08-24 MED ORDER — DOCUSATE SODIUM 100 MG PO CAPS
ORAL_CAPSULE | ORAL | Status: AC
  Filled 2021-08-24: qty 1

## 2021-08-24 NOTE — Discharge Summary (Signed)
Gynecology Physician Postoperative Discharge Summary  Patient ID: [name] MRN: [id number] DOB/AGE: [birth year] 60 y.o.  Admit Date: 08/23/2021 Discharge Date: 08/24/2021  Preoperative Diagnoses: Complex endometrial hyperplasia without atypia, postmenopausal bleeding, history of two previous cesarean sections  Procedures: Procedure(s) (LRB): LAPAROSCOPIC ASSISTED VAGINAL HYSTERECTOMY WITH BILATERAL SALPINGECTOMY (Bilateral)  Hospital Course:  [name] is a 60 y.o. [id number]  admitted for scheduled surgery.  She underwent the procedures as mentioned above, her operation was uncomplicated. For further details about surgery, please refer to the operative report. Patient had an uncomplicated postoperative course. By time of discharge on POD#1, her pain was controlled on oral pain medications; she was ambulating, voiding without difficulty, tolerating regular diet. She was deemed stable for discharge to home.   Significant Labs: CBC Latest Ref Rng & Units 08/24/2021 08/21/2021 12/18/2012  WBC 4.0 - 10.5 K/uL 11.6(H) 7.7 7.3  Hemoglobin 12.0 - 15.0 g/dL 11.4(L) 14.0 12.8  Hematocrit 36.0 - 46.0 % 33.5(L) 42.1 40.0  Platelets 150 - 400 K/uL 251 298 269    Discharge Exam: Blood pressure (!) 122/58, pulse 79, temperature 98.2 F (36.8 C), resp. rate 18, height '5\' 7"'$  (1.702 m), weight 112 kg, last menstrual period 03/29/2020, SpO2 94 %. General appearance: alert and no distress  Resp: clear to auscultation bilaterally  Cardio: regular rate and rhythm  GI: soft, non-tender; bowel sounds normal; no masses, no organomegaly.  Incisions: C/D/I, no erythema, no drainage noted Pelvic: scant blood on pad reported by patient Extremities: extremities normal, atraumatic, no cyanosis or edema and Homans sign is negative, no sign of DVT  Discharged Condition: Stable  Disposition: Discharge disposition: 01-Home or Self Care     Discharge Instructions     Discharge patient   Complete by: As  directed    Discharge to home later if stable   Discharge disposition: 01-Home or Self Care   Discharge patient date: 08/24/2021      Allergies as of 08/24/2021       Reactions   Amlodipine Swelling, Other (See Comments)   petichane   Hydroxychloroquine Nausea And Vomiting        Medication List     TAKE these medications    acetaminophen 325 MG tablet Commonly known as: TYLENOL Take 650 mg by mouth as needed.   ALLEGRA PO Take by mouth daily. Alternates with certrizine   calcium carbonate 500 MG chewable tablet Commonly known as: TUMS - dosed in mg elemental calcium Chew 1 tablet by mouth as needed.   cetirizine 10 MG tablet Commonly known as: ZYRTEC Take 10 mg by mouth daily. Alternates with allegra   chlorthalidone 25 MG tablet Commonly known as: HYGROTON TAKE 1 TABLET BY MOUTH EVERY MORNING WITH FOOD   docusate sodium 100 MG capsule Commonly known as: COLACE Take 1 capsule (100 mg total) by mouth 2 (two) times daily.   fluconazole 150 MG tablet Commonly known as: DIFLUCAN Take 1 tablet by mouth once * May repeat weekly as needed for yeast infection (Take 1 tablet by mouth once * May repeat weekly as needed for yeast infection)   fluticasone 50 MCG/ACT nasal spray Commonly known as: FLONASE Place into both nostrils daily.   folic acid 1 MG tablet Commonly known as: FOLVITE Take 2 tablets (2 mg total) by mouth daily.   Humira Pen 40 MG/0.4ML Pnkt Generic drug: Adalimumab INJECT 40 MG SUBCUTANEOUSLY EVERY OTHER WEEK.   ibuprofen 600 MG tablet Commonly known as: ADVIL Take 1 tablet (600 mg total)  by mouth every 6 (six) hours.   levocetirizine 5 MG tablet Commonly known as: XYZAL Take 5 mg by mouth every evening.   lisinopril 20 MG tablet Commonly known as: ZESTRIL TAKE 1/2 TABLET BY MOUTH DAILY   meloxicam 15 MG tablet Commonly known as: MOBIC TAKE 1 TABLET BY MOUTH ONCE A DAY What changed: additional instructions   methotrexate 2.5 MG  tablet Take 8 tablets by mouth once weekly   multivitamin tablet Take 1 tablet by mouth daily.   ondansetron 4 MG disintegrating tablet Commonly known as: Zofran ODT Take 1 tablet (4 mg total) by mouth every 6 (six) hours as needed for nausea.   oxyCODONE 5 MG immediate release tablet Commonly known as: Oxy IR/ROXICODONE Take 1 tablet (5 mg total) by mouth every 4 (four) hours as needed for moderate pain.   pantoprazole 20 MG tablet Commonly known as: Protonix Take 1 tablet (20 mg total) by mouth daily before breakfast. What changed:  when to take this reasons to take this   VITA-C PO Take by mouth.   Vitamin D 50 MCG (2000 UT) Caps Take 1 capsule by mouth daily.         Follow-up Information     Jacarius Handel, Sallyanne Havers, MD Follow up in 2 week(s).   Specialty: Obstetrics and Gynecology Why: You will be called with appointment date and time for postoperative appointment. Call office/come to ER for any concerning issues Contact information: [place] [address] [phone]                 Total discharge time: 15 minutes   Signed:  Verita Schneiders, MD, [place] for [employer], [place]

## 2021-08-30 LAB — SURGICAL PATHOLOGY

## 2021-08-31 ENCOUNTER — Other Ambulatory Visit (HOSPITAL_COMMUNITY): Payer: Self-pay

## 2021-09-06 ENCOUNTER — Other Ambulatory Visit: Payer: Self-pay | Admitting: Internal Medicine

## 2021-09-06 ENCOUNTER — Other Ambulatory Visit (HOSPITAL_COMMUNITY): Payer: Self-pay

## 2021-09-06 MED ORDER — CHLORTHALIDONE 25 MG PO TABS
ORAL_TABLET | ORAL | 3 refills | Status: DC
  Filled 2021-09-06: qty 90, 90d supply, fill #0

## 2021-09-06 MED FILL — Meloxicam Tab 15 MG: ORAL | 90 days supply | Qty: 90 | Fill #0 | Status: CN

## 2021-09-08 ENCOUNTER — Telehealth: Payer: Self-pay | Admitting: Internal Medicine

## 2021-09-08 ENCOUNTER — Other Ambulatory Visit (HOSPITAL_COMMUNITY): Payer: Self-pay

## 2021-09-08 MED ORDER — AMBULATORY NON FORMULARY MEDICATION: 1 refills | Status: DC

## 2021-09-08 NOTE — Telephone Encounter (Signed)
Inbound call from pt requesting a call back. About Lodicane medication that Dr. Carlean Purl had prescribed to the pt. Pt stated that the meds was prescribed over a year ago. I did advise her that she will need a office visit for a med refill. Pt decline wanting a office visit. Please advise. Thank you.

## 2021-09-08 NOTE — Telephone Encounter (Signed)
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 Her father in law is dying. 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 She is requesting the rx we gave her for that in the past. She had a refill but it had been to long ago for them to fill. I told her I will send it to [facility] and she will call us back if this doesn't help.

## 2021-09-12 ENCOUNTER — Other Ambulatory Visit (HOSPITAL_COMMUNITY): Payer: Self-pay

## 2021-09-15 ENCOUNTER — Other Ambulatory Visit (HOSPITAL_COMMUNITY): Payer: Self-pay

## 2021-09-18 ENCOUNTER — Other Ambulatory Visit (HOSPITAL_COMMUNITY): Payer: Self-pay

## 2021-09-18 MED FILL — Meloxicam Tab 15 MG: ORAL | 90 days supply | Qty: 90 | Fill #0 | Status: AC

## 2021-09-20 ENCOUNTER — Other Ambulatory Visit (HOSPITAL_COMMUNITY): Payer: Self-pay

## 2021-09-20 ENCOUNTER — Other Ambulatory Visit: Payer: Self-pay

## 2021-09-20 ENCOUNTER — Encounter: Payer: Self-pay | Admitting: Obstetrics & Gynecology

## 2021-09-20 ENCOUNTER — Ambulatory Visit (INDEPENDENT_AMBULATORY_CARE_PROVIDER_SITE_OTHER): Payer: 59 | Admitting: Obstetrics & Gynecology

## 2021-09-20 VITALS — BP 156/78 | HR 94 | Ht 67.0 in | Wt 248.0 lb

## 2021-09-20 DIAGNOSIS — N8502 Endometrial intraepithelial neoplasia [EIN]: Secondary | ICD-10-CM

## 2021-09-20 DIAGNOSIS — Z09 Encounter for follow-up examination after completed treatment for conditions other than malignant neoplasm: Secondary | ICD-10-CM

## 2021-09-20 DIAGNOSIS — Z90710 Acquired absence of both cervix and uterus: Secondary | ICD-10-CM

## 2021-09-20 NOTE — Progress Notes (Signed)
No pain no d/c, still a little soreness. Having constipation, still using Colase.

## 2021-09-20 NOTE — Progress Notes (Signed)
   POSTOPERATIVE VISIT  Subjective:     [name] is a 60 y.o. female who presents to the clinic 4 weeks status post laparoscopic assisted vaginal hysterectomy for  complex endometrial hyperplasia . Eating a regular diet without difficulty. Bowel movements are abnormal with constipation, but alleviated by using Colace and Miralax . The patient is not having any pain.  The following portions of the patient's history were reviewed and updated as appropriate: allergies, current medications, past family history, past medical history, past social history, past surgical history, and problem list.  Review of Systems Pertinent items noted in HPI and remainder of comprehensive ROS otherwise negative.    Objective:    BP (!) 156/78   Pulse 94   Ht 5\' 7"  (1.702 m)   Wt 248 lb (112.5 kg)   LMP 09/26/2020   BMI 38.84 kg/m  General:  alert and no distress  Abdomen: soft, bowel sounds active, non-tender  Incision:   healing well, no drainage, no erythema, no hernia, no seroma, no swelling, no dehiscence, incision well approximated   08/23/21 Surgical pathology UTERUS, CERVIX, BILATERAL FALLOPIAN TUBES, HYSTERECTOMY AND SALPINGECTOMY:  - Foci of complex endometrial hyperplasia with focal atypia  - No evidence of invasive carcinoma  - Benign unremarkable cervix  - Benign unremarkable bilateral fallopian tubes    Assessment:    Doing well postoperatively. Operative findings again reviewed. Pathology report discussed.    Plan:    1. Continue any current medications. 2. Wound care discussed. 3. Activity restrictions:  pelvic rest for 4 more weeks 4. Anticipated return to work: 2-3 weeks. 5. Follow up: 3 weeks for pelvic exam.    Verita Schneiders, MD, [place], [facility] for [employer], [place]

## 2021-09-22 ENCOUNTER — Encounter: Payer: Self-pay | Admitting: Radiology

## 2021-09-30 ENCOUNTER — Encounter: Payer: Self-pay | Admitting: Radiology

## 2021-10-09 ENCOUNTER — Ambulatory Visit (INDEPENDENT_AMBULATORY_CARE_PROVIDER_SITE_OTHER): Payer: 59 | Admitting: Obstetrics & Gynecology

## 2021-10-09 ENCOUNTER — Encounter: Payer: Self-pay | Admitting: Obstetrics & Gynecology

## 2021-10-09 VITALS — BP 135/83 | HR 94 | Wt 251.9 lb

## 2021-10-09 DIAGNOSIS — Z09 Encounter for follow-up examination after completed treatment for conditions other than malignant neoplasm: Secondary | ICD-10-CM

## 2021-10-09 DIAGNOSIS — Z90710 Acquired absence of both cervix and uterus: Secondary | ICD-10-CM

## 2021-10-09 NOTE — Progress Notes (Signed)
Patient stated that she has npo questions or concerns and that she is feeling fine.   Aiman Sonn, CMA

## 2021-10-09 NOTE — Progress Notes (Signed)
   POSTOPERATIVE VISIT  Subjective:     [name] is a 60 y.o. female who presents to the office seven weeks status post laparoscopic assisted vaginal hysterectomy for  complex endometrial hyperplasia . Eating a regular diet without difficulty. Occasional constipation, but alleviated by using Colace and Miralax. The patient is not having any pain.  The following portions of the patient's history were reviewed and updated as appropriate: allergies, current medications, past family history, past medical history, past social history, past surgical history, and problem list.  Review of Systems Pertinent items noted in HPI and remainder of comprehensive ROS otherwise negative.    Objective:    LMP 09/26/2020  General:  alert and no distress  Abdomen: soft, bowel sounds active, non-tender, well-healed laparoscopic incisions  Vaginal cuff:   healing well, no drainage, no erythema, no dehiscence, well approximated, physiologic discharge noted      Assessment:    Doing well postoperatively. Operative findings again reviewed. Pathology report discussed.    Plan:    1. Continue any current medications. 2. Wound care discussed. 3. Activity restrictions: pelvic rest for a total of eight weeks postop 4. Anticipated return to work: now. 5. Follow up as needed.    Verita Schneiders, MD, [place] for [employer], [place]

## 2021-10-11 ENCOUNTER — Other Ambulatory Visit (HOSPITAL_COMMUNITY): Payer: Self-pay

## 2021-10-11 DIAGNOSIS — M150 Primary generalized (osteo)arthritis: Secondary | ICD-10-CM | POA: Diagnosis not present

## 2021-10-11 DIAGNOSIS — Z6839 Body mass index (BMI) 39.0-39.9, adult: Secondary | ICD-10-CM | POA: Diagnosis not present

## 2021-10-11 DIAGNOSIS — E669 Obesity, unspecified: Secondary | ICD-10-CM | POA: Diagnosis not present

## 2021-10-11 DIAGNOSIS — M0579 Rheumatoid arthritis with rheumatoid factor of multiple sites without organ or systems involvement: Secondary | ICD-10-CM | POA: Diagnosis not present

## 2021-10-11 DIAGNOSIS — M2550 Pain in unspecified joint: Secondary | ICD-10-CM | POA: Diagnosis not present

## 2021-10-11 DIAGNOSIS — Z79899 Other long term (current) drug therapy: Secondary | ICD-10-CM | POA: Diagnosis not present

## 2021-10-12 ENCOUNTER — Other Ambulatory Visit (HOSPITAL_COMMUNITY): Payer: Self-pay

## 2021-10-12 MED ORDER — METHOTREXATE SODIUM 2.5 MG PO TABS
20.0000 mg | ORAL_TABLET | ORAL | 0 refills | Status: DC
  Filled 2021-10-12: qty 96, 84d supply, fill #0

## 2021-10-13 ENCOUNTER — Other Ambulatory Visit (HOSPITAL_COMMUNITY): Payer: Self-pay

## 2021-10-23 ENCOUNTER — Other Ambulatory Visit (HOSPITAL_COMMUNITY): Payer: Self-pay

## 2021-11-01 ENCOUNTER — Other Ambulatory Visit (HOSPITAL_COMMUNITY): Payer: Self-pay

## 2021-11-02 DIAGNOSIS — H5213 Myopia, bilateral: Secondary | ICD-10-CM | POA: Diagnosis not present

## 2021-11-02 DIAGNOSIS — H52203 Unspecified astigmatism, bilateral: Secondary | ICD-10-CM | POA: Diagnosis not present

## 2021-11-02 DIAGNOSIS — H524 Presbyopia: Secondary | ICD-10-CM | POA: Diagnosis not present

## 2021-11-07 ENCOUNTER — Other Ambulatory Visit (HOSPITAL_COMMUNITY): Payer: Self-pay

## 2021-11-10 ENCOUNTER — Other Ambulatory Visit (HOSPITAL_COMMUNITY): Payer: Self-pay

## 2021-11-13 ENCOUNTER — Other Ambulatory Visit (HOSPITAL_COMMUNITY): Payer: Self-pay

## 2021-11-14 ENCOUNTER — Other Ambulatory Visit (HOSPITAL_COMMUNITY): Payer: Self-pay

## 2021-11-15 ENCOUNTER — Other Ambulatory Visit (HOSPITAL_COMMUNITY): Payer: Self-pay

## 2021-11-17 ENCOUNTER — Other Ambulatory Visit (HOSPITAL_COMMUNITY): Payer: Self-pay

## 2021-11-20 ENCOUNTER — Other Ambulatory Visit (HOSPITAL_COMMUNITY): Payer: Self-pay

## 2021-11-21 ENCOUNTER — Other Ambulatory Visit (HOSPITAL_COMMUNITY): Payer: Self-pay

## 2021-11-22 ENCOUNTER — Other Ambulatory Visit (HOSPITAL_COMMUNITY): Payer: Self-pay

## 2021-11-27 DIAGNOSIS — E785 Hyperlipidemia, unspecified: Secondary | ICD-10-CM | POA: Diagnosis not present

## 2021-11-27 DIAGNOSIS — K5900 Constipation, unspecified: Secondary | ICD-10-CM | POA: Diagnosis not present

## 2021-11-27 DIAGNOSIS — Z6839 Body mass index (BMI) 39.0-39.9, adult: Secondary | ICD-10-CM | POA: Diagnosis not present

## 2021-11-27 DIAGNOSIS — R7303 Prediabetes: Secondary | ICD-10-CM | POA: Diagnosis not present

## 2021-11-27 DIAGNOSIS — M069 Rheumatoid arthritis, unspecified: Secondary | ICD-10-CM | POA: Diagnosis not present

## 2021-11-27 DIAGNOSIS — I10 Essential (primary) hypertension: Secondary | ICD-10-CM | POA: Diagnosis not present

## 2021-11-27 DIAGNOSIS — J309 Allergic rhinitis, unspecified: Secondary | ICD-10-CM | POA: Diagnosis not present

## 2021-11-27 DIAGNOSIS — Z0000 Encounter for general adult medical examination without abnormal findings: Secondary | ICD-10-CM | POA: Diagnosis not present

## 2021-11-28 ENCOUNTER — Other Ambulatory Visit (HOSPITAL_COMMUNITY): Payer: Self-pay

## 2021-12-01 ENCOUNTER — Other Ambulatory Visit (HOSPITAL_COMMUNITY): Payer: Self-pay

## 2021-12-04 ENCOUNTER — Other Ambulatory Visit (HOSPITAL_COMMUNITY): Payer: Self-pay

## 2021-12-05 ENCOUNTER — Other Ambulatory Visit (HOSPITAL_COMMUNITY): Payer: Self-pay

## 2021-12-08 ENCOUNTER — Other Ambulatory Visit (HOSPITAL_COMMUNITY): Payer: Self-pay

## 2021-12-08 MED ORDER — CHLORTHALIDONE 25 MG PO TABS
ORAL_TABLET | ORAL | 2 refills | Status: DC
Start: 2021-09-12 — End: 2022-09-04
  Filled 2021-12-08: qty 90, 90d supply, fill #0
  Filled 2022-03-10: qty 90, 90d supply, fill #1
  Filled 2022-06-18: qty 90, 90d supply, fill #2

## 2021-12-08 MED ORDER — CHLORTHALIDONE 25 MG PO TABS
ORAL_TABLET | ORAL | 3 refills | Status: DC
  Filled 2021-12-08: qty 90, 90d supply, fill #0

## 2021-12-11 ENCOUNTER — Other Ambulatory Visit (HOSPITAL_COMMUNITY): Payer: Self-pay

## 2021-12-11 ENCOUNTER — Other Ambulatory Visit: Payer: Self-pay | Admitting: Pharmacist

## 2021-12-11 MED ORDER — HUMIRA (2 PEN) 40 MG/0.4ML ~~LOC~~ AJKT
40.0000 mg | AUTO-INJECTOR | SUBCUTANEOUS | 7 refills | Status: DC
  Filled 2021-12-11: qty 2, 28d supply, fill #0

## 2021-12-11 MED ORDER — HUMIRA (2 PEN) 40 MG/0.4ML ~~LOC~~ AJKT
40.0000 mg | AUTO-INJECTOR | SUBCUTANEOUS | 7 refills | Status: DC
Start: 2021-12-11 — End: 2022-07-20
  Filled 2021-12-11: qty 2, fill #0
  Filled 2021-12-11: qty 2, 28d supply, fill #0
  Filled 2022-01-10: qty 2, 28d supply, fill #1
  Filled 2022-01-30: qty 2, 28d supply, fill #2
  Filled 2022-03-01: qty 2, 28d supply, fill #3
  Filled 2022-03-27: qty 2, 28d supply, fill #4
  Filled 2022-04-26: qty 2, 28d supply, fill #5
  Filled 2022-05-25: qty 2, 28d supply, fill #6
  Filled 2022-06-22: qty 2, 28d supply, fill #7

## 2021-12-22 ENCOUNTER — Other Ambulatory Visit (HOSPITAL_COMMUNITY): Payer: Self-pay

## 2021-12-27 ENCOUNTER — Other Ambulatory Visit (HOSPITAL_COMMUNITY): Payer: Self-pay

## 2021-12-27 MED FILL — Lisinopril Tab 20 MG: ORAL | 90 days supply | Qty: 45 | Fill #0 | Status: AC

## 2022-01-01 ENCOUNTER — Other Ambulatory Visit (HOSPITAL_COMMUNITY): Payer: Self-pay

## 2022-01-02 ENCOUNTER — Other Ambulatory Visit (HOSPITAL_COMMUNITY): Payer: Self-pay

## 2022-01-07 ENCOUNTER — Other Ambulatory Visit (HOSPITAL_COMMUNITY): Payer: Self-pay

## 2022-01-09 ENCOUNTER — Other Ambulatory Visit (HOSPITAL_COMMUNITY): Payer: Self-pay

## 2022-01-09 DIAGNOSIS — M0579 Rheumatoid arthritis with rheumatoid factor of multiple sites without organ or systems involvement: Secondary | ICD-10-CM | POA: Diagnosis not present

## 2022-01-09 MED ORDER — METHOTREXATE SODIUM 2.5 MG PO TABS
20.0000 mg | ORAL_TABLET | ORAL | 0 refills | Status: DC
  Filled 2022-01-09: qty 96, 84d supply, fill #0

## 2022-01-10 ENCOUNTER — Other Ambulatory Visit (HOSPITAL_COMMUNITY): Payer: Self-pay

## 2022-01-29 ENCOUNTER — Other Ambulatory Visit (HOSPITAL_COMMUNITY): Payer: Self-pay

## 2022-01-29 ENCOUNTER — Ambulatory Visit: Payer: 59 | Admitting: Internal Medicine

## 2022-01-30 ENCOUNTER — Other Ambulatory Visit (HOSPITAL_COMMUNITY): Payer: Self-pay

## 2022-02-06 ENCOUNTER — Other Ambulatory Visit (HOSPITAL_COMMUNITY): Payer: Self-pay

## 2022-02-19 ENCOUNTER — Other Ambulatory Visit: Payer: Self-pay | Admitting: Obstetrics & Gynecology

## 2022-02-19 DIAGNOSIS — Z1231 Encounter for screening mammogram for malignant neoplasm of breast: Secondary | ICD-10-CM

## 2022-02-21 ENCOUNTER — Other Ambulatory Visit (HOSPITAL_COMMUNITY): Payer: Self-pay

## 2022-02-26 ENCOUNTER — Other Ambulatory Visit: Payer: Self-pay | Admitting: Family Medicine

## 2022-02-26 DIAGNOSIS — N6311 Unspecified lump in the right breast, upper outer quadrant: Secondary | ICD-10-CM

## 2022-02-26 NOTE — Progress Notes (Signed)
Physical Exam ?Chest:  ?Breasts: ?   Breasts are symmetrical.  ?   Right: Mass present. No swelling, inverted nipple or nipple discharge.  ?   Left: No swelling or nipple discharge.  ? ? ? ? ?

## 2022-03-01 ENCOUNTER — Other Ambulatory Visit (HOSPITAL_COMMUNITY): Payer: Self-pay

## 2022-03-07 ENCOUNTER — Other Ambulatory Visit (HOSPITAL_COMMUNITY): Payer: Self-pay

## 2022-03-10 ENCOUNTER — Other Ambulatory Visit (HOSPITAL_COMMUNITY): Payer: Self-pay

## 2022-03-12 ENCOUNTER — Other Ambulatory Visit (HOSPITAL_COMMUNITY): Payer: Self-pay

## 2022-03-12 MED ORDER — MELOXICAM 15 MG PO TABS
ORAL_TABLET | ORAL | 1 refills | Status: DC
  Filled 2022-03-12: qty 90, 90d supply, fill #0
  Filled 2022-08-23: qty 90, 90d supply, fill #1

## 2022-03-16 ENCOUNTER — Other Ambulatory Visit (HOSPITAL_COMMUNITY): Payer: Self-pay

## 2022-03-16 MED ORDER — CARESTART COVID-19 HOME TEST VI KIT
PACK | 1 refills | Status: DC
  Filled 2022-03-16: qty 2, 4d supply, fill #0
  Filled 2022-04-10: qty 2, 4d supply, fill #1

## 2022-03-22 ENCOUNTER — Ambulatory Visit
Admission: RE | Admit: 2022-03-22 | Discharge: 2022-03-22 | Disposition: A | Discharge status: Home / Self Care | Payer: 59 | Source: Ambulatory Visit | Attending: Family Medicine | Admitting: Family Medicine

## 2022-03-22 DIAGNOSIS — R928 Other abnormal and inconclusive findings on diagnostic imaging of breast: Secondary | ICD-10-CM | POA: Diagnosis not present

## 2022-03-22 DIAGNOSIS — N6311 Unspecified lump in the right breast, upper outer quadrant: Secondary | ICD-10-CM

## 2022-03-22 DIAGNOSIS — N644 Mastodynia: Secondary | ICD-10-CM | POA: Diagnosis not present

## 2022-03-26 ENCOUNTER — Other Ambulatory Visit (HOSPITAL_COMMUNITY): Payer: Self-pay

## 2022-03-26 MED ORDER — METHOTREXATE SODIUM 2.5 MG PO TABS
20.0000 mg | ORAL_TABLET | ORAL | 0 refills | Status: DC
  Filled 2022-03-26: qty 96, 84d supply, fill #0

## 2022-03-26 MED ORDER — LISINOPRIL 20 MG PO TABS
ORAL_TABLET | ORAL | 2 refills | Status: DC
  Filled 2022-03-26: qty 45, 90d supply, fill #0
  Filled 2022-06-24: qty 45, 90d supply, fill #1
  Filled 2022-09-21: qty 45, 90d supply, fill #2

## 2022-03-27 ENCOUNTER — Other Ambulatory Visit (HOSPITAL_COMMUNITY): Payer: Self-pay

## 2022-03-28 ENCOUNTER — Other Ambulatory Visit (HOSPITAL_COMMUNITY): Payer: Self-pay

## 2022-03-30 ENCOUNTER — Other Ambulatory Visit (HOSPITAL_COMMUNITY): Payer: Self-pay

## 2022-04-03 DIAGNOSIS — Z1231 Encounter for screening mammogram for malignant neoplasm of breast: Secondary | ICD-10-CM

## 2022-04-04 ENCOUNTER — Other Ambulatory Visit (HOSPITAL_COMMUNITY): Payer: Self-pay

## 2022-04-10 ENCOUNTER — Other Ambulatory Visit (HOSPITAL_COMMUNITY): Payer: Self-pay

## 2022-04-16 DIAGNOSIS — M0579 Rheumatoid arthritis with rheumatoid factor of multiple sites without organ or systems involvement: Secondary | ICD-10-CM | POA: Diagnosis not present

## 2022-04-16 DIAGNOSIS — Z6841 Body Mass Index (BMI) 40.0 and over, adult: Secondary | ICD-10-CM | POA: Diagnosis not present

## 2022-04-16 DIAGNOSIS — M1991 Primary osteoarthritis, unspecified site: Secondary | ICD-10-CM | POA: Diagnosis not present

## 2022-04-16 DIAGNOSIS — R5383 Other fatigue: Secondary | ICD-10-CM | POA: Diagnosis not present

## 2022-04-16 DIAGNOSIS — Z79899 Other long term (current) drug therapy: Secondary | ICD-10-CM | POA: Diagnosis not present

## 2022-04-25 ENCOUNTER — Other Ambulatory Visit (HOSPITAL_COMMUNITY): Payer: Self-pay

## 2022-04-26 ENCOUNTER — Other Ambulatory Visit (HOSPITAL_COMMUNITY): Payer: Self-pay

## 2022-05-07 ENCOUNTER — Other Ambulatory Visit (HOSPITAL_COMMUNITY): Payer: Self-pay

## 2022-05-17 ENCOUNTER — Ambulatory Visit: Payer: 59 | Attending: Family Medicine | Admitting: Pharmacist

## 2022-05-17 DIAGNOSIS — Z79899 Other long term (current) drug therapy: Secondary | ICD-10-CM

## 2022-05-17 NOTE — Progress Notes (Signed)
   S: Patient presents to [facility] [place] for review of their specialty medication therapy.  Patient is currently taking Humira for rheumatoid arthritis. Patient is managed by Dr. Trudie Reed for this.   Adherence: denies any missed doses  Efficacy: reports that it is okay for her right now. Admits that efficacy is not 100%. She knows when she is getting close to needing her next dose.    Dosing: Rheumatoid arthritis: SubQ: 40 mg every other week (may continue methotrexate, other nonbiologic DMARDS, corticosteroids, NSAIDs, and/or analgesics); patients not taking concomitant methotrexate may increase dose to 40 mg every week  Dose adjustments: Renal: no dose adjustments (has not been studied) Hepatic: no dose adjustments (has not been studied)  Screening: TB test: completed and was negative per patient Hepatitis: completed per patient  Monitoring: S/sx of infection: denies CBC: monitored q3 months and reports it is WNL S/sx of hypersensitivity: denies S/sx of malignancy: denies S/sx of heart failure: denies  O:     Lab Results  Component Value Date   WBC 11.6 (H) 08/24/2021   HGB 11.4 (L) 08/24/2021   HCT 33.5 (L) 08/24/2021   MCV 91.5 08/24/2021   PLT 251 08/24/2021      Chemistry      Component Value Date/Time   NA 137 08/23/2021 1527   K 3.5 08/23/2021 1527   CL 97 (L) 08/23/2021 1527   CO2 30 08/23/2021 1527   BUN 17 08/23/2021 1527   CREATININE 0.76 08/23/2021 1527      Component Value Date/Time   CALCIUM 8.7 (L) 08/23/2021 1527   ALKPHOS 47 06/29/2009 2026   AST 17 06/29/2009 2026   ALT 19 06/29/2009 2026   BILITOT 0.4 06/29/2009 2026       A/P: 1. Medication review: Patient currently on Humira for rheumatoid arthritis and continues to tolerate it well with no adverse effects. Reviewed the medication with the patient, including the following: Humira is a TNF blocking agent indicated for ankylosing spondylitis, Crohn's disease, Hidradenitis suppurativa,  psoriatic arthritis, plaque psoriasis, ulcerative colitis, and uveitis. Patient educated on purpose, proper use and potential adverse effects of Humira. The most common adverse effects are infections, headache, and injection site reactions. 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 There is the possibility of an increased risk of malignancy but it is not well understood if this increased risk is due to there medication or the disease state. There are rare cases of pancytopenia and aplastic anemia. No recommendations for any changes at this time.  Benard Halsted, PharmD, [place], [place] [phone]

## 2022-05-22 ENCOUNTER — Other Ambulatory Visit (HOSPITAL_COMMUNITY): Payer: Self-pay

## 2022-05-23 ENCOUNTER — Other Ambulatory Visit (HOSPITAL_COMMUNITY): Payer: Self-pay

## 2022-05-23 MED ORDER — FOLIC ACID 1 MG PO TABS
ORAL_TABLET | ORAL | 3 refills | Status: DC
  Filled 2022-05-23: qty 180, 90d supply, fill #0
  Filled 2022-08-23: qty 180, 90d supply, fill #1
  Filled 2022-11-19: qty 180, 90d supply, fill #2
  Filled 2023-02-21: qty 180, 90d supply, fill #3

## 2022-05-25 ENCOUNTER — Other Ambulatory Visit (HOSPITAL_COMMUNITY): Payer: Self-pay

## 2022-05-31 ENCOUNTER — Other Ambulatory Visit (HOSPITAL_COMMUNITY): Payer: Self-pay

## 2022-06-04 ENCOUNTER — Other Ambulatory Visit (HOSPITAL_COMMUNITY): Payer: Self-pay

## 2022-06-18 ENCOUNTER — Other Ambulatory Visit (HOSPITAL_COMMUNITY): Payer: Self-pay

## 2022-06-19 ENCOUNTER — Other Ambulatory Visit (HOSPITAL_COMMUNITY): Payer: Self-pay

## 2022-06-21 ENCOUNTER — Other Ambulatory Visit (HOSPITAL_COMMUNITY): Payer: Self-pay

## 2022-06-22 ENCOUNTER — Other Ambulatory Visit (HOSPITAL_COMMUNITY): Payer: Self-pay

## 2022-06-24 ENCOUNTER — Other Ambulatory Visit (HOSPITAL_COMMUNITY): Payer: Self-pay

## 2022-06-25 ENCOUNTER — Other Ambulatory Visit (HOSPITAL_COMMUNITY): Payer: Self-pay

## 2022-06-27 ENCOUNTER — Other Ambulatory Visit (HOSPITAL_COMMUNITY): Payer: Self-pay

## 2022-06-27 MED ORDER — METHOTREXATE SODIUM 2.5 MG PO TABS
20.0000 mg | ORAL_TABLET | ORAL | 0 refills | Status: DC
  Filled 2022-06-27: qty 96, 84d supply, fill #0

## 2022-06-28 ENCOUNTER — Ambulatory Visit: Payer: 59 | Attending: Internal Medicine

## 2022-06-28 ENCOUNTER — Other Ambulatory Visit (HOSPITAL_BASED_OUTPATIENT_CLINIC_OR_DEPARTMENT_OTHER): Payer: Self-pay

## 2022-06-28 DIAGNOSIS — Z23 Encounter for immunization: Secondary | ICD-10-CM

## 2022-06-28 MED ORDER — PFIZER COVID-19 VAC BIVALENT 30 MCG/0.3ML IM SUSP
INTRAMUSCULAR | 0 refills | Status: DC
  Filled 2022-06-28: qty 0.3, 1d supply, fill #0

## 2022-06-28 NOTE — Progress Notes (Signed)
   Covid-19 Vaccination Clinic  Name:  [name]    MRN: [id number] DOB: 03-31-1961  06/28/2022  [name] was observed post Covid-19 immunization for 15 minutes without incident. She was provided with Vaccine Information Sheet and instruction to access the V-Safe system.   [name] was instructed to call 911 with any severe reactions post vaccine: Difficulty breathing  Swelling of face and throat  A fast heartbeat  A bad rash all over body  Dizziness and weakness   Immunizations Administered     Name Date Dose VIS Date Route   Pfizer Covid-19 Vaccine Bivalent Booster 06/28/2022  1:10 PM 0.3 mL 08/23/2021 Intramuscular   Manufacturer: [employer]   Lot: [id number]   [place]: [phone]

## 2022-07-17 ENCOUNTER — Other Ambulatory Visit (HOSPITAL_COMMUNITY): Payer: Self-pay

## 2022-07-17 DIAGNOSIS — M0579 Rheumatoid arthritis with rheumatoid factor of multiple sites without organ or systems involvement: Secondary | ICD-10-CM | POA: Diagnosis not present

## 2022-07-18 ENCOUNTER — Other Ambulatory Visit (HOSPITAL_COMMUNITY): Payer: Self-pay

## 2022-07-20 ENCOUNTER — Other Ambulatory Visit (HOSPITAL_COMMUNITY): Payer: Self-pay

## 2022-07-20 ENCOUNTER — Other Ambulatory Visit: Payer: Self-pay | Admitting: Pharmacist

## 2022-07-20 MED ORDER — HUMIRA (2 PEN) 40 MG/0.4ML ~~LOC~~ AJKT
AUTO-INJECTOR | SUBCUTANEOUS | 6 refills | Status: DC
Start: 2022-07-20 — End: 2023-02-04
  Filled 2022-07-20: qty 2, 28d supply, fill #0
  Filled 2022-08-17: qty 2, 28d supply, fill #1
  Filled 2022-09-14: qty 2, 28d supply, fill #2
  Filled 2022-10-12: qty 2, 28d supply, fill #3
  Filled 2022-11-06: qty 2, 28d supply, fill #4
  Filled 2022-12-18 (×2): qty 2, 28d supply, fill #5
  Filled 2023-01-04: qty 2, 28d supply, fill #6

## 2022-07-20 MED ORDER — HUMIRA (2 PEN) 40 MG/0.4ML ~~LOC~~ AJKT: AUTO-INJECTOR | SUBCUTANEOUS | 6 refills | Status: DC

## 2022-07-23 ENCOUNTER — Other Ambulatory Visit (HOSPITAL_COMMUNITY): Payer: Self-pay

## 2022-07-25 ENCOUNTER — Other Ambulatory Visit (HOSPITAL_COMMUNITY): Payer: Self-pay

## 2022-07-26 ENCOUNTER — Other Ambulatory Visit (HOSPITAL_COMMUNITY): Payer: Self-pay

## 2022-07-27 ENCOUNTER — Other Ambulatory Visit (HOSPITAL_COMMUNITY): Payer: Self-pay

## 2022-08-02 ENCOUNTER — Other Ambulatory Visit (HOSPITAL_COMMUNITY): Payer: Self-pay

## 2022-08-03 IMAGING — US US BREAST*R* LIMITED INC AXILLA
1 series · 5 of 5 positions shown · non-contrast
Comparison: Previous exam(s).

CLINICAL DATA: Focal pain and lump in the low right axillary
region.

EXAM:
DIGITAL DIAGNOSTIC BILATERAL MAMMOGRAM WITH TOMOSYNTHESIS AND CAD;
ULTRASOUND RIGHT BREAST LIMITED
TECHNIQUE: Bilateral digital diagnostic mammography and breast tomosynthesis
was performed. The images were evaluated with computer-aided
detection.; Targeted ultrasound examination of the right breast was
performed

[Series 1: us breast*right* limited inc axilla · 0.09mm/px · 5 of 5 slices shown]
[im 1/5]
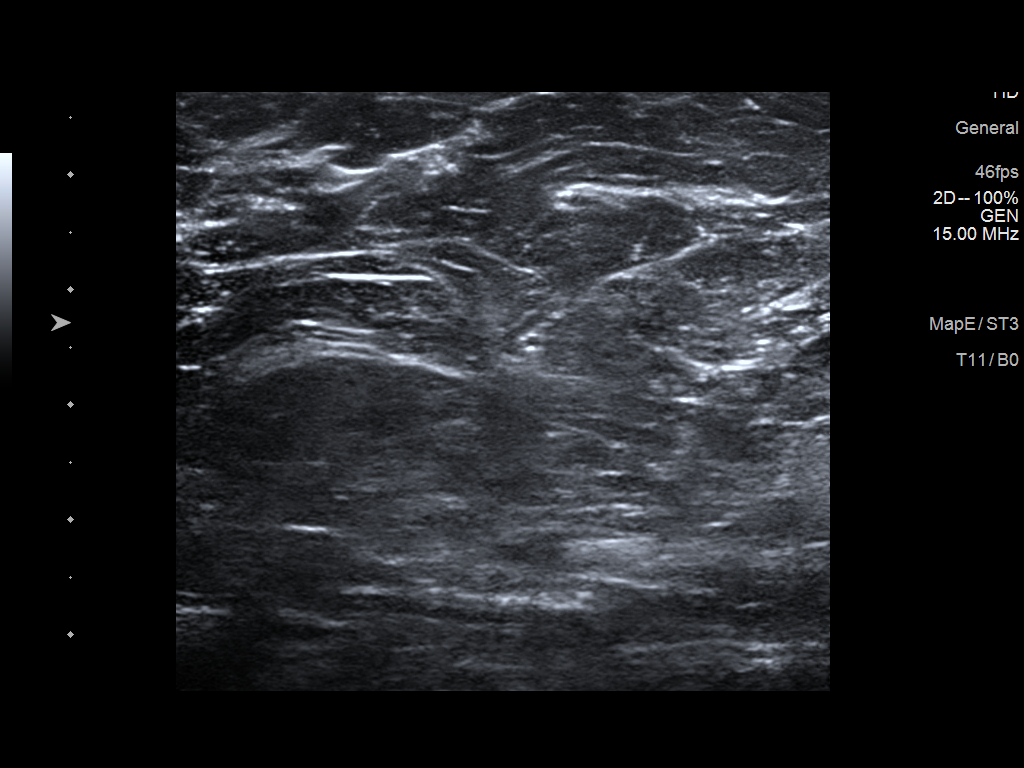
[im 2/5]
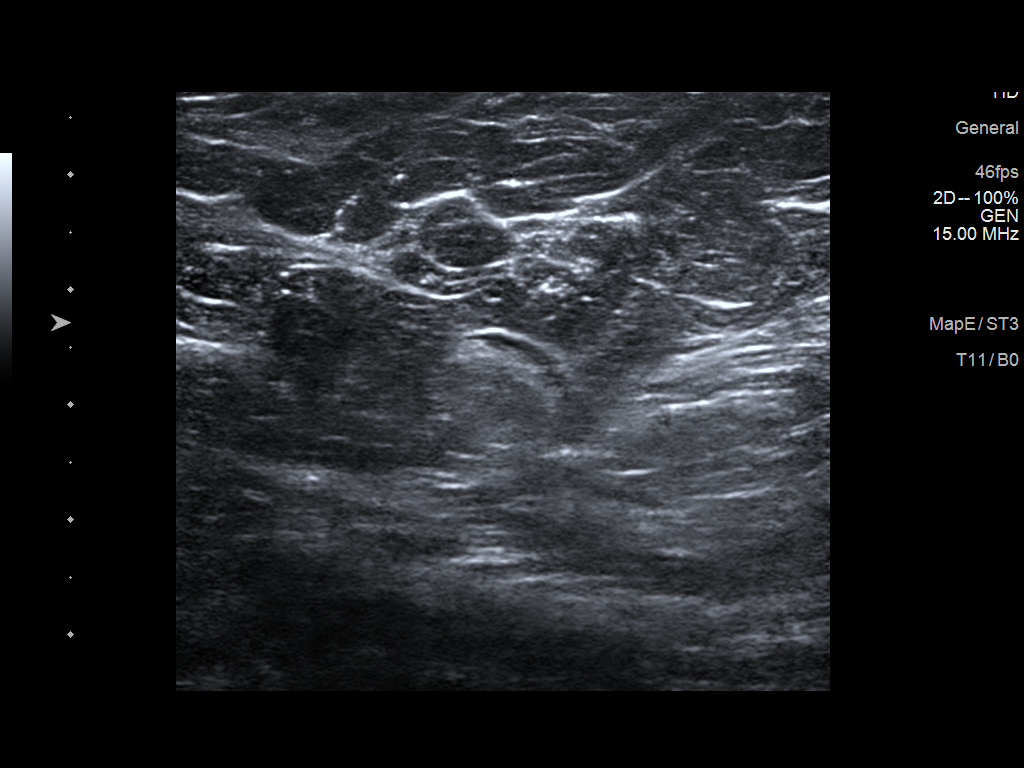
[im 3/5]
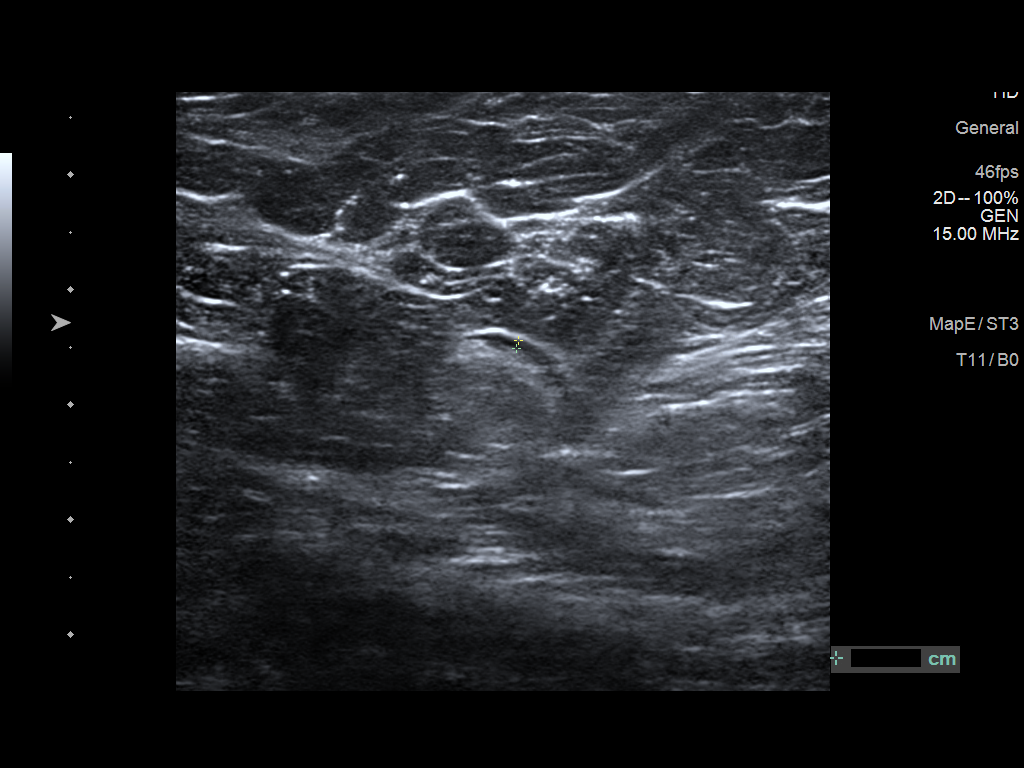
[im 4/5]
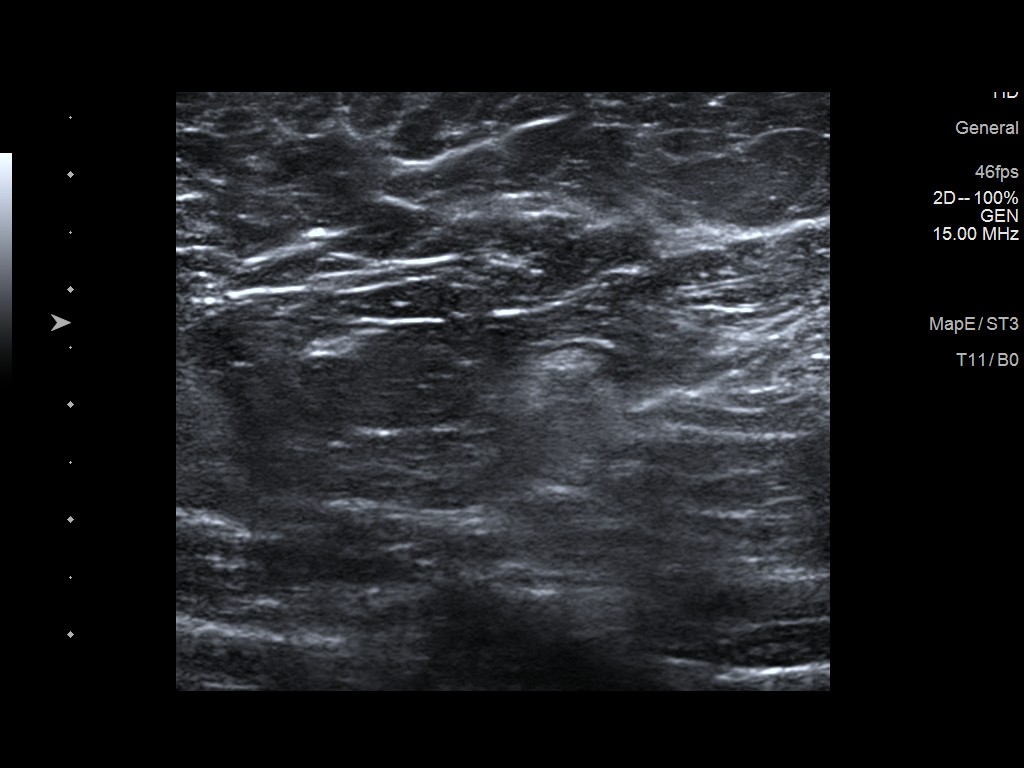
[im 5/5]
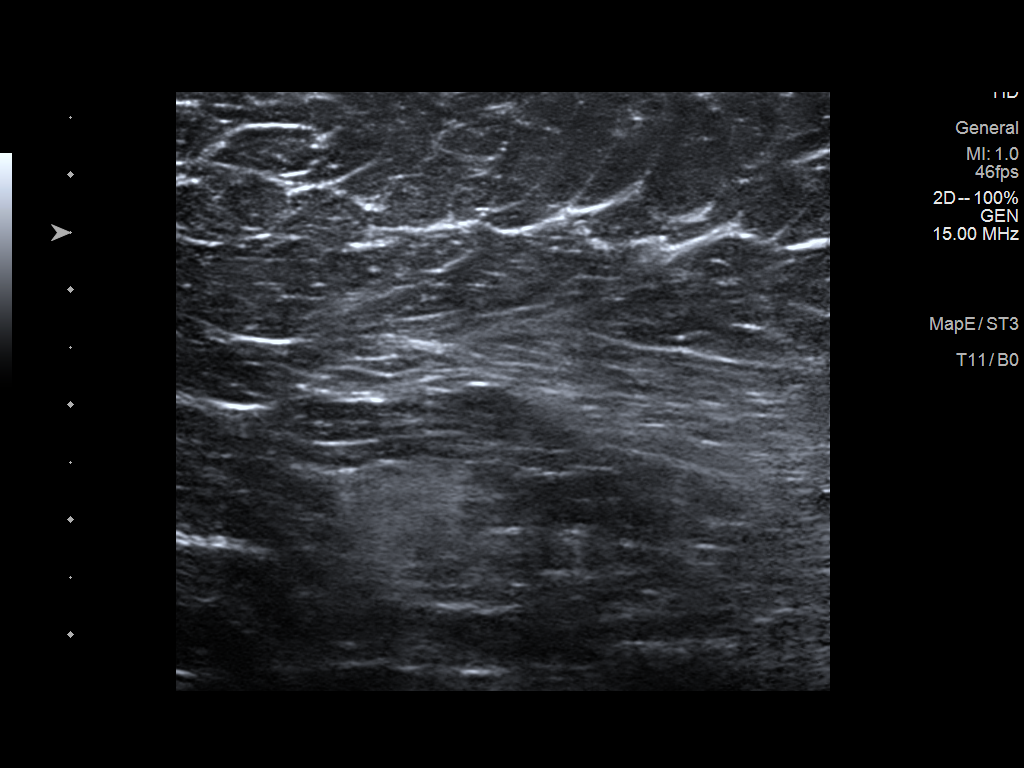

[5 of 5 positions shown; findings below may reference images not displayed]

ACR Breast Density Category b: There are scattered areas of
fibroglandular density.
FINDINGS: No suspicious masses, calcifications, or distortion identified in
either breast.

On physical exam, no suspicious lumps are identified.

Targeted ultrasound is performed, showing no sonographic abnormality
in the region of the patient's symptoms.
IMPRESSION: No mammographic or sonographic evidence of malignancy.

RECOMMENDATION:
Treatment of the patient's symptoms should be based on clinical and
physical exam given lack of imaging findings. Recommend annual
screening mammography.

I have discussed the findings and recommendations with the patient.
If applicable, a reminder letter will be sent to the patient
regarding the next appointment.

BI-RADS CATEGORY  2: Benign.

## 2022-08-16 ENCOUNTER — Other Ambulatory Visit (HOSPITAL_COMMUNITY): Payer: Self-pay

## 2022-08-17 ENCOUNTER — Other Ambulatory Visit (HOSPITAL_COMMUNITY): Payer: Self-pay

## 2022-08-20 DIAGNOSIS — D2239 Melanocytic nevi of other parts of face: Secondary | ICD-10-CM | POA: Diagnosis not present

## 2022-08-20 DIAGNOSIS — Z419 Encounter for procedure for purposes other than remedying health state, unspecified: Secondary | ICD-10-CM | POA: Diagnosis not present

## 2022-08-20 DIAGNOSIS — L821 Other seborrheic keratosis: Secondary | ICD-10-CM | POA: Diagnosis not present

## 2022-08-20 DIAGNOSIS — D1801 Hemangioma of skin and subcutaneous tissue: Secondary | ICD-10-CM | POA: Diagnosis not present

## 2022-08-20 DIAGNOSIS — L820 Inflamed seborrheic keratosis: Secondary | ICD-10-CM | POA: Diagnosis not present

## 2022-08-20 DIAGNOSIS — L304 Erythema intertrigo: Secondary | ICD-10-CM | POA: Diagnosis not present

## 2022-08-20 DIAGNOSIS — L814 Other melanin hyperpigmentation: Secondary | ICD-10-CM | POA: Diagnosis not present

## 2022-08-21 ENCOUNTER — Other Ambulatory Visit (HOSPITAL_COMMUNITY): Payer: Self-pay

## 2022-08-22 ENCOUNTER — Other Ambulatory Visit (HOSPITAL_COMMUNITY): Payer: Self-pay

## 2022-08-23 ENCOUNTER — Other Ambulatory Visit (HOSPITAL_COMMUNITY): Payer: Self-pay

## 2022-08-28 ENCOUNTER — Encounter: Payer: 59 | Attending: Obstetrics & Gynecology | Admitting: Skilled Nursing Facility1

## 2022-08-28 ENCOUNTER — Encounter: Payer: Self-pay | Admitting: Skilled Nursing Facility1

## 2022-08-28 DIAGNOSIS — E639 Nutritional deficiency, unspecified: Secondary | ICD-10-CM | POA: Insufficient documentation

## 2022-08-28 NOTE — Progress Notes (Signed)
Medical Nutrition Therapy  Appointment Start time:  9:54  Appointment End time:  10:55  Primary concerns today: to lose weight, decrease inflammatory foods, lose middle age belly Referral diagnosis: [employer] Employee Preferred learning style: auditory Learning readiness: contemplating   NUTRITION ASSESSMENT    Clinical Medical Hx: GERD, Asthma, hyperlipidemia, HTN, RA Medications: see list Labs: none updated in EMR Notable Signs/Symptoms: joint pain daily occasionally getting int he way of her activity   Lifestyle & Dietary Hx  Pt states she has been gaining weight since middle age stating her highest was 256 pounds and states she currently weighs 246 pounds. Pt state she has been doing the lose it app on and off for many years.  Pt states she has been incorporating more fruits and vegetables into her diet but state she is not big on non starchy vegetables.  Pt states she plans to calorie count.  Pt states her husband makes the meals.   Dietitian asked what does she feel will be better way to move forward with change Macronutrient percent distribution or a whole foods approach? Pt states she needs to know the food she cannot eat and the foods she should eat. Pt states she will still eat desserts sometimes.    Estimated daily fluid intake: 48 oz Supplements: multivitamin, vitamin D, vitamin C, folic acid Sleep: wakes a couple times a night; getting about 7 hours a night waking feeling rested on most days Stress / self-care: no stress to report  Current average weekly physical activity: walking her dog 2 times a week 25 minutes  24-Hr Dietary Recall: eaten out 2 times a week First Meal: fruit and granola or breakfast burrito Snack:  Second Meal: frozen meal or tomato and bacon sandwich Snack:  Third Meal: pork tacos or chicken fingers + zucchini + oil sometimes with fruit Snack: 2 times a week chips and avocado dip Beverages: coffee + half and half and honey, water, diet juice,  wine on the weekends 2   NUTRITION INTERVENTION  Nutrition education (E-1) on the following topics:  Antiinflammatory diet and food choices Plant based proteins Physical Activity and weight loss Caloric intake and weight loss  Handouts Provided Include  Detailed MyPlate Meal ideas sheet offered specific meal ideas  Learning Style & Readiness for Change Teaching method utilized: Visual & Auditory  Demonstrated degree of understanding via: Teach Back  Barriers to learning/adherence to lifestyle change: RA limiting activity   Goals Established by Pt 3 days a week walking outside or on the treadmill for 30 minutes with the goal to increase as she can Eat non starchy vegetables a total of 4 servings per day (1/2 cup cooked or 1 cup raw is one serving) (pt states this will be tough) Aim for plant based protein option a couple times a week (pt states she was raised on a farm and is more of a meat and potatoes kind of person so this will be tough) Avoid overly processed foods such as processed meats nd prepacked items such as vienna sausage or tweenkies; whole wheat bread is not a processed food to avoid   MONITORING & EVALUATION Dietary intake, weekly physical activity  Next Steps  Patient is to follow up with Dietitian as desired.

## 2022-09-04 ENCOUNTER — Encounter (HOSPITAL_BASED_OUTPATIENT_CLINIC_OR_DEPARTMENT_OTHER): Payer: Self-pay | Admitting: Nurse Practitioner

## 2022-09-04 ENCOUNTER — Ambulatory Visit (HOSPITAL_BASED_OUTPATIENT_CLINIC_OR_DEPARTMENT_OTHER): Payer: 59 | Admitting: Nurse Practitioner

## 2022-09-04 VITALS — BP 123/59 | HR 78 | Ht 66.0 in | Wt 250.0 lb

## 2022-09-04 DIAGNOSIS — I10 Essential (primary) hypertension: Secondary | ICD-10-CM | POA: Diagnosis not present

## 2022-09-04 DIAGNOSIS — M0579 Rheumatoid arthritis with rheumatoid factor of multiple sites without organ or systems involvement: Secondary | ICD-10-CM | POA: Diagnosis not present

## 2022-09-04 DIAGNOSIS — Z0000 Encounter for general adult medical examination without abnormal findings: Secondary | ICD-10-CM | POA: Diagnosis not present

## 2022-09-04 DIAGNOSIS — H9042 Sensorineural hearing loss, unilateral, left ear, with unrestricted hearing on the contralateral side: Secondary | ICD-10-CM | POA: Diagnosis not present

## 2022-09-04 NOTE — Patient Instructions (Addendum)
Thank you for choosing [facility] for your Primary Care needs. I am excited for the opportunity to partner with you to meet your health care goals. It was a pleasure meeting you today!  Recommendations from today's visit: I will review your records to see if there are any concerns Your blood pressure looks great today. I recommend continuing with your current regimen.  We will plan to do your physical exam in December when this is due  Information on diet, exercise, and health maintenance recommendations are listed below. This is information to help you be sure you are on track for optimal health and monitoring.   Please look over this and let us know if you have any questions or if you have completed any of the health maintenance outside of [facility] so that we can be sure your records are up to date.  ___________________________________________________________ About Me: I am an Adult-Geriatric Nurse Practitioner with a background in caring for patients for more than 20 years with a strong intensive care background. I provide primary care and sports medicine services to patients age 14 and older within this office. My education had a strong focus on caring for the older adult population, which I am passionate about. I am also the director of the APP Fellowship with [facility].   My desire is to provide you with the best service through preventive medicine and supportive care. I consider you a part of the medical team and value your input. I work diligently to ensure that you are heard and your needs are met in a safe and effective manner. I want you to feel comfortable with me as your provider and want you to know that your health concerns are important to me.  For your information, our office hours are: Monday, Tuesday, and Thursday 8:00 AM - 5:00 PM Wednesday and Friday 8:00 AM - 12:00 PM.   In my time away from the office I am teaching new APP's within  the system and am unavailable, but my partner, Dr. Burnard Bunting is in the office for emergent needs.   If you have questions or concerns, please call our office at [phone] or send [place] a MyChart message and we will respond as quickly as possible.  ____________________________________________________________ MyChart:  For all urgent or time sensitive needs we ask that you please call the office to avoid delays. Our number is [phone]. MyChart is not constantly monitored and due to the large volume of messages a day, replies may take up to 72 business hours.  MyChart Policy: MyChart allows for you to see your visit notes, after visit summary, provider recommendations, lab and tests results, make an appointment, request refills, and contact your provider or the office for non-urgent questions or concerns. Providers are seeing patients during normal business hours and do not have built in time to review MyChart messages.  We ask that you allow a minimum of 3 business days for responses to [employer]. For this reason, please do not send urgent requests through [place]. Please call the office at [phone]. New and ongoing conditions may require a visit. We have virtual and in person visit available for your convenience.  Complex MyChart concerns may require a visit. Your provider may request you schedule a virtual or in person visit to ensure we are providing the best care possible. MyChart messages sent after 11:00 AM on Friday will not be received by the provider until Monday morning.    Lab  and Test Results: You will receive your lab and test results on MyChart as soon as they are completed and results have been sent by the lab or testing facility. Due to this service, you will receive your results BEFORE your provider.  I review lab and tests results each morning prior to seeing patients. Some results require collaboration with other providers to ensure you are receiving the most  appropriate care. For this reason, we ask that you please allow a minimum of 3-5 business days from the time the ALL results have been received for your provider to receive and review lab and test results and contact you about these.  Most lab and test result comments from the provider will be sent through [place]. Your provider may recommend changes to the plan of care, follow-up visits, repeat testing, ask questions, or request an office visit to discuss these results. You may reply directly to this message or call the office at [phone] to provide information for the provider or set up an appointment. In some instances, you will be called with test results and recommendations. Please let us know if this is preferred and we will make note of this in your chart to provide this for you.    If you have not heard a response to your lab or test results in 5 business days from all results returning to [place], please call the office to let us know. We ask that you please avoid calling prior to this time unless there is an emergent concern. Due to high call volumes, this can delay the resulting process.  After Hours: For all non-emergency after hours needs, please call the office at [phone] and select the option to reach the on-call provider service. On-call services are shared between multiple [facility] offices and therefore it will not be possible to speak directly with your provider. On-call providers may provide medical advice and recommendations, but are unable to provide refills for maintenance medications.  For all emergency or urgent medical needs after normal business hours, we recommend that you seek care at the closest Urgent Care or Emergency Department to ensure appropriate treatment in a timely manner.  [facility] has a 24 hour emergency room located on the ground floor for your convenience.   Urgent Concerns During the Business Day Providers are seeing patients  from 8AM to [place] with a busy schedule and are most often not able to respond to non-urgent calls until the end of the day or the next business day. If you should have URGENT concerns during the day, please call and speak to the nurse or schedule a same day appointment so that we can address your concern without delay.   Thank you, again, for choosing me as your health care partner. I appreciate your trust and look forward to learning more about you.   Worthy Keeler, DNP, AGNP-c ___________________________________________________________  Health Maintenance Recommendations Screening Testing Mammogram Every 1 -2 years based on history and risk factors Starting at age 37 Pap Smear Ages 21-39 every 3 years Ages 9-65 every 5 years with HPV testing More frequent testing may be required based on results and history Colon Cancer Screening Every 1-10 years based on test performed, risk factors, and history Starting at age 22 Bone Density Screening Every 2-10 years based on history Starting at age 22 for women Recommendations for men differ based on medication usage, history, and risk factors AAA Screening One time ultrasound Men 72-75 years old who have every  smoked Lung Cancer Screening Low Dose Lung CT every 12 months Age 28-80 years with a 30 pack-year smoking history who still smoke or who have quit within the last 15 years  Screening Labs Routine  Labs: Complete Blood Count (CBC), Complete Metabolic Panel (CMP), Cholesterol (Lipid Panel) Every 6-12 months based on history and medications May be recommended more frequently based on current conditions or previous results Hemoglobin A1c Lab Every 3-12 months based on history and previous results Starting at age 69 or earlier with diagnosis of diabetes, high cholesterol, BMI >26, and/or risk factors Frequent monitoring for patients with diabetes to ensure blood sugar control Thyroid Panel (TSH w/ T3 & T4) Every 6 months based on  history, symptoms, and risk factors May be repeated more often if on medication HIV One time testing for all patients 31 and older May be repeated more frequently for patients with increased risk factors or exposure Hepatitis C One time testing for all patients 62 and older May be repeated more frequently for patients with increased risk factors or exposure Gonorrhea, Chlamydia Every 12 months for all sexually active persons 13-24 years Additional monitoring may be recommended for those who are considered high risk or who have symptoms PSA Men 36-63 years old with risk factors Additional screening may be recommended from age 52-69 based on risk factors, symptoms, and history  Vaccine Recommendations Tetanus Booster All adults every 10 years Flu Vaccine All patients 6 months and older every year COVID Vaccine All patients 12 years and older Initial dosing with booster May recommend additional booster based on age and health history HPV Vaccine 2 doses all patients age 52-26 Dosing may be considered for patients over 26 Shingles Vaccine (Shingrix) 2 doses all adults 53 years and older Pneumonia (Pneumovax 23) All adults 25 years and older May recommend earlier dosing based on health history Pneumonia (Prevnar 66) All adults 60 years and older Dosed 1 year after Pneumovax 23  Additional Screening, Testing, and Vaccinations may be recommended on an individualized basis based on family history, health history, risk factors, and/or exposure.  __________________________________________________________  Diet Recommendations for All Patients  I recommend that all patients maintain a diet low in saturated fats, carbohydrates, and cholesterol. While this can be challenging at first, it is not impossible and small changes can make big differences.  Things to try: Decreasing the amount of soda, sweet tea, and/or juice to one or less per day and replace with water While water is always  the first choice, if you do not like water you may consider adding a water additive without sugar to improve the taste other sugar free drinks Replace potatoes with a brightly colored vegetable at dinner Use healthy oils, such as canola oil or olive oil, instead of butter or hard margarine Limit your bread intake to two pieces or less a day Replace regular pasta with low carb pasta options Bake, broil, or grill foods instead of frying Monitor portion sizes  Eat smaller, more frequent meals throughout the day instead of large meals  An important thing to remember is, if you love foods that are not great for your health, you don't have to give them up completely. Instead, allow these foods to be a reward when you have done well. Allowing yourself to still have special treats every once in a while is a nice way to tell yourself thank you for working hard to keep yourself healthy.   Also remember that every day is a new day. If  you have a bad day and "fall off the wagon", you can still climb right back up and keep moving along on your journey!  We have resources available to help you!  Some websites that may be helpful include: www.http://carter.biz/  Www.VeryWellFit.com _____________________________________________________________  Activity Recommendations for All Patients  I recommend that all adults get at least 20 minutes of moderate physical activity that elevates your heart rate at least 5 days out of the week.  Some examples include: Walking or jogging at a pace that allows you to carry on a conversation Cycling (stationary bike or outdoors) Water aerobics Yoga Weight lifting Dancing If physical limitations prevent you from putting stress on your joints, exercise in a pool or seated in a chair are excellent options.  Do determine your MAXIMUM heart rate for activity: YOUR AGE - 220 = MAX HeartRate   Remember! Do not push yourself too hard.  Start slowly and build up your pace, speed,  weight, time in exercise, etc.  Allow your body to rest between exercise and get good sleep. You will need more water than normal when you are exerting yourself. Do not wait until you are thirsty to drink. Drink with a purpose of getting in at least 8, 8 ounce glasses of water a day plus more depending on how much you exercise and sweat.    If you begin to develop dizziness, chest pain, abdominal pain, jaw pain, shortness of breath, headache, vision changes, lightheadedness, or other concerning symptoms, stop the activity and allow your body to rest. If your symptoms are severe, seek emergency evaluation immediately. If your symptoms are concerning, but not severe, please let us know so that we can recommend further evaluation.

## 2022-09-04 NOTE — Progress Notes (Signed)
Orma Render, DNP, AGNP-c Primary Care & Sports Medicine [address]  [place], [place] [address] [phone]  New patient visit   Patient: [name]   DOB: 11-11-1961   61 y.o. Female  MRN: [id number] Visit Date: 09/04/2022  Patient Care Team: Skyelynn Rambeau, Coralee Pesa, NP as PCP - General (Nurse Practitioner)  Today's Vitals   09/04/22 0830  BP: (!) 123/59  Pulse: 78  SpO2: 99%  Weight: 250 lb (113.4 kg)  Height: '5\' 6"'  (1.676 m)   Body mass index is 40.35 kg/m.   Today's healthcare provider: Orma Render, NP   Chief Complaint  Patient presents with   New Patient (Initial Visit)    Patient presents to establish care. She is due for flu shot and she will get it at work as she is a Colon is a 61 y.o. female who presents today as a new patient to establish care.    Patient endorses the following concerns presently: Rheumatoid arthritis She has a history of RA and is immunocompromised with treatment of Humira and Methotrexate. She also takes 1/2 tab of meloxicam daily for pain. Occasionally she will take a full tab if she is having a lot of pain. She has blood work with rheumatology every 3 months. She sees the doctor every 6 months.  Dr. 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   Psoriasis She also has history of psoriasis, but medications for RA control this  GERD She also tells me she has GERD and does not take anything daily, but takes Pepcid complete as needed  Cholesterol She tells me that she "walks the line" with high cholesterol for many years In December her A1c was 5.7% with [facility] Lipids were slightly on the elevated side with good HDL She continues to take vitamin D supplement for history of low Vitamin D levels- last result was 57  Chronic constipation Constipation since hysterectomy Metamucil most days and colace most days helps keep her regular.    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 08/23/2021   08/23/21 Surgical pathology UTERUS, CERVIX, BILATERAL FALLOPIAN TUBES, HYSTERECTOMY AND SALPINGECTOMY:  - Foci of complex endometrial hyperplasia with focal atypia  - No evidence of invasive carcinoma  - Benign unremarkable cervix  - Benign unremarkable bilateral fallopian tubes   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 08/23/2021   08/23/21 Surgical pathology UTERUS, CERVIX, BILATERAL FALLOPIAN TUBES, HYSTERECTOMY AND SALPINGECTOMY:  - Foci of complex endometrial hyperplasia with focal atypia  - No evidence of invasive carcinoma  - Benign unremarkable cervix  - Benign unremarkable bilateral fallopian tubes   GERD (gastroesophageal reflux disease)    Helicobacter pylori antibody positive 2006   Tx w/ PPI,  amoxicillin and Biaxin   Hemorrhoids    History of gestational diabetes    yrs ago with 1st pregnancy   Hyperlipidemia    Hypertension    Obesity    PMB (postmenopausal bleeding) 08/15/2021   Pneumonia    as child   Postmenopausal bleeding 06/23/2021   Ultrasound showed 8.6 mm EM, 3 cm fibroid   Psoriasis 08/15/2021   PVC's (premature ventricular contractions)    occasional R/O cardiac via [facility] yrs ago   Rheumatoid arthritis ([place])    Wears glasses    or contacts   Past Surgical History:  Procedure Laterality Date   CERVICAL POLYPECTOMY  12/22/2012   Procedure:  CERVICAL POLYPECTOMY;  Surgeon: Thurnell Lose, MD;  Location: [facility];  Service: Gynecology;  Laterality: N/A;   CESAREAN SECTION     x 2 1999 and 2001   COLONOSCOPY     colonscopy and endoscopy  08/05/2020   HYSTEROSCOPY WITH D & C  12/22/2012   Procedure: DILATATION AND CURETTAGE /HYSTEROSCOPY;  Surgeon: Thurnell Lose, MD;  Location: [facility];  Service: Gynecology;;   LAPAROSCOPIC VAGINAL HYSTERECTOMY WITH SALPINGECTOMY Bilateral 08/23/2021   Procedure: LAPAROSCOPIC ASSISTED VAGINAL HYSTERECTOMY WITH BILATERAL SALPINGECTOMY;  Surgeon: Osborne Oman, MD;  Location: [facility];  Service: Gynecology;  Laterality: Bilateral;   PLANTAR'S WART EXCISION     8 yrs ago per pt on 08-15-2021   Family Status  Relation Name Status   Mother  Alive   Father  Deceased   Brother  (Not Specified)   Neg Hx  (Not Specified)   Family History  Problem Relation Age of Onset   Diabetes Brother        x 2    Colon cancer Neg Hx    Social History   Socioeconomic History   Marital status: Married    Spouse name: Not on file   Number of children: 2   Years of education: Not on file   Highest education level: Not on file  Occupational History   Occupation: MSN    Employer: [employer]  Tobacco Use   Smoking status: Never   Smokeless tobacco: Never  Vaping Use   Vaping Use: Never used  Substance and Sexual Activity   Alcohol use: Yes    Comment: occ   Drug use: No   Sexual activity: Yes    Birth control/protection: Post-menopausal  Other Topics Concern   Not on file  Social History Narrative   Daily caffeine    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   Adalimumab (HUMIRA PEN) 40 MG/0.4ML PNKT INJECT 40 MG subcutaneously EVERY OTHER WEEK   AMBULATORY NON FORMULARY MEDICATION Diltiazem gel 2% mixed with Lidocaine 5% Sig: apply a pea size amount twice daily to rectum   Ascorbic Acid (VITA-C PO) Take by mouth.   calcium carbonate (TUMS - DOSED IN MG ELEMENTAL CALCIUM) 500 MG chewable tablet Chew 1 tablet by mouth as needed.   chlorthalidone (  HYGROTON) 25 MG tablet Take 1 tablet by mouth once a day   Cholecalciferol (VITAMIN D) 2000 UNITS CAPS Take 1 capsule by mouth daily.   fluticasone (FLONASE) 50 MCG/ACT nasal spray Place into both nostrils daily.   folic acid (FOLVITE) 1 MG tablet Take 2 tablets (2 mg total) by mouth daily.   ibuprofen (ADVIL) 600 MG tablet Take 1 tablet (600 mg total) by mouth every 6 (six) hours.   levocetirizine (XYZAL) 5 MG tablet Take 5 mg by mouth every evening.   lisinopril (ZESTRIL) 20 MG tablet Take 1/2 tablet by mouth once daily   meloxicam (MOBIC) 15 MG tablet Take 1 tablet by mouth once a day   Multiple Vitamin (MULTIVITAMIN) tablet Take 1 tablet by mouth daily.   [DISCONTINUED] chlorthalidone (HYGROTON) 25 MG tablet Take 1 tablet by mouth once daily   [DISCONTINUED] COVID-19 At Home Antigen Test (CARESTART COVID-19 HOME TEST) KIT Use as directed   [DISCONTINUED] COVID-19 mRNA bivalent vaccine, Pfizer, (PFIZER COVID-19 VAC BIVALENT) injection Inject into the muscle.   [DISCONTINUED] methotrexate 2.5 MG tablet Take 8 tablets by mouth once weekly   [DISCONTINUED] lisinopril (ZESTRIL) 20 MG tablet TAKE 1/2 TABLET BY MOUTH DAILY   [DISCONTINUED] ondansetron (ZOFRAN ODT) 4 MG disintegrating tablet Allow 1 tablet (4 mg total) to dissolve by mouth every 6 (six) hours as needed for nausea. (Patient not taking: Reported on 10/09/2021)   [DISCONTINUED] oxyCODONE (OXY IR/ROXICODONE) 5  MG immediate release tablet Take 1 tablet (5 mg total) by mouth every 4 (four) hours as needed for moderate pain. (Patient not taking: Reported on 10/09/2021)   [DISCONTINUED] pantoprazole (PROTONIX) 20 MG tablet Take 1 tablet (20 mg total) by mouth daily before breakfast. (Patient not taking: Reported on 10/09/2021)   No facility-administered medications prior to visit.   Allergies  Allergen Reactions   Amlodipine Swelling and Other (See Comments)    petichane   Hydroxychloroquine Nausea And Vomiting   Immunization History  Administered Date(s) Administered   Influenza-Unspecified 09/26/2017, 09/29/2020   PFIZER(Purple Top)SARS-COV-2 Vaccination 01/07/2020, 02/07/2020, 10/21/2020   Pfizer Covid-19 Vaccine Bivalent Booster 19yr & up 06/28/2022   Tdap 09/23/2013    Review of Systems All review of systems negative except what is listed in the HPI   Objective    BP (!) 123/59   Pulse 78   Ht '5\' 6"'  (1.676 m)   Wt 250 lb (113.4 kg)   LMP 03/29/2020 (Approximate)   SpO2 99%   BMI 40.35 kg/m  Physical Exam Vitals and nursing note reviewed.  Constitutional:      General: She is not in acute distress.    Appearance: Normal appearance.  Eyes:     Extraocular Movements: Extraocular movements intact.     Conjunctiva/sclera: Conjunctivae normal.     Pupils: Pupils are equal, round, and reactive to light.  Neck:     Vascular: No carotid bruit.  Cardiovascular:     Rate and Rhythm: Normal rate and regular rhythm.     Pulses: Normal pulses.     Heart sounds: Normal heart sounds. No murmur heard. Pulmonary:     Effort: Pulmonary effort is normal.     Breath sounds: Normal breath sounds. No wheezing.  Musculoskeletal:        General: Normal range of motion.     Cervical back: Normal range of motion.     Right lower leg: No edema.     Left lower leg: No edema.  Skin:    General: Skin is  warm and dry.     Capillary Refill: Capillary refill takes less than 2 seconds.   Neurological:     General: No focal deficit present.     Mental Status: She is alert and oriented to person, place, and time.  Psychiatric:        Mood and Affect: Mood normal.        Behavior: Behavior normal.        Thought Content: Thought content normal.        Judgment: Judgment normal.     Results for orders placed or performed in visit on 09/04/22  LP+LDL Direct  Result Value Ref Range   Cholesterol, Total 222 (H) 100 - 199 mg/dL   Triglycerides 141 0 - 149 mg/dL   HDL 57 >39 mg/dL   VLDL Cholesterol Cal 25 5 - 40 mg/dL   LDL Chol Calc (NIH) 140 (H) 0 - 99 mg/dL   LDL Direct 132 (H) 0 - 99 mg/dL  Hemoglobin A1c  Result Value Ref Range   Hgb A1c MFr Bld 5.8 (H) 4.8 - 5.6 %   Est. average glucose Bld gHb Est-mCnc 120 mg/dL    Assessment & Plan      Problem List Items Addressed This Visit     Hypertension - Primary    Chronic.  Well-controlled today.  No alarm symptoms are present at this time.  Continue current medications.  Plan to follow-up in 6 months or sooner if needed.      Rheumatoid arthritis involving multiple sites with positive rheumatoid factor (HCC)    Chronic.  Following with rheumatology.  Labs completed with them.  We will continue to monitor.      Sensorineural hearing loss (SNHL) of left ear with unrestricted hearing of right ear    Chronic.  No alarm symptoms are present at this time.  Continue to monitor.      Other Visit Diagnoses     Routine adult health maintenance       Relevant Orders   LP+LDL Direct (Completed)   Hemoglobin A1c (Completed)        Return for CPE in December/Jan(40).      Lucio Litsey, Coralee Pesa, NP, DNP, AGNP-C [facility] at [facility] Maintenance Due Health Maintenance Topics with due status: Overdue     Topic Date Due   Zoster Vaccines- Shingrix Never done   INFLUENZA VACCINE 07/24/2022   COVID-19 Vaccine 08/23/2022   Health Maintenance Topics  with due status: Due On     Topic Date Due   PAP SMEAR-Modifier 08/09/2022    CPE Due  Labs Due

## 2022-09-05 LAB — LP+LDL DIRECT
Cholesterol, Total: 222 mg/dL — ABNORMAL HIGH (ref 100–199)
HDL: 57 mg/dL (ref >39)
LDL Chol Calc (NIH): 140 mg/dL — ABNORMAL HIGH (ref 0–99)
LDL Direct: 132 mg/dL — ABNORMAL HIGH (ref 0–99)
Triglycerides: 141 mg/dL (ref 0–149)
VLDL Cholesterol Cal: 25 mg/dL (ref 5–40)

## 2022-09-05 LAB — HEMOGLOBIN A1C
Est. average glucose Bld gHb Est-mCnc: 120 mg/dL
Hgb A1c MFr Bld: 5.8 % — ABNORMAL HIGH (ref 4.8–5.6)

## 2022-09-13 ENCOUNTER — Other Ambulatory Visit (HOSPITAL_BASED_OUTPATIENT_CLINIC_OR_DEPARTMENT_OTHER): Payer: Self-pay | Admitting: Nurse Practitioner

## 2022-09-13 ENCOUNTER — Other Ambulatory Visit (HOSPITAL_COMMUNITY): Payer: Self-pay

## 2022-09-14 ENCOUNTER — Other Ambulatory Visit (HOSPITAL_COMMUNITY): Payer: Self-pay

## 2022-09-14 MED ORDER — CHLORTHALIDONE 25 MG PO TABS
25.0000 mg | ORAL_TABLET | ORAL | 0 refills | Status: DC
  Filled 2022-09-14: qty 90, 90d supply, fill #0

## 2022-09-20 ENCOUNTER — Other Ambulatory Visit (HOSPITAL_COMMUNITY): Payer: Self-pay

## 2022-09-20 MED ORDER — METHOTREXATE SODIUM 2.5 MG PO TABS
20.0000 mg | ORAL_TABLET | ORAL | 0 refills | Status: DC
  Filled 2022-09-20: qty 96, 84d supply, fill #0

## 2022-09-21 ENCOUNTER — Other Ambulatory Visit (HOSPITAL_COMMUNITY): Payer: Self-pay

## 2022-09-22 DIAGNOSIS — M0579 Rheumatoid arthritis with rheumatoid factor of multiple sites without organ or systems involvement: Secondary | ICD-10-CM | POA: Insufficient documentation

## 2022-09-22 DIAGNOSIS — H9042 Sensorineural hearing loss, unilateral, left ear, with unrestricted hearing on the contralateral side: Secondary | ICD-10-CM | POA: Insufficient documentation

## 2022-09-22 NOTE — Assessment & Plan Note (Signed)
Chronic.  Well-controlled today.  No alarm symptoms are present at this time.  Continue current medications.  Plan to follow-up in 6 months or sooner if needed.

## 2022-09-22 NOTE — Assessment & Plan Note (Signed)
Chronic.  Following with rheumatology.  Labs completed with them.  We will continue to monitor.

## 2022-09-22 NOTE — Assessment & Plan Note (Signed)
Chronic.  No alarm symptoms are present at this time.  Continue to monitor.

## 2022-09-24 ENCOUNTER — Other Ambulatory Visit: Payer: Self-pay | Admitting: Obstetrics & Gynecology

## 2022-09-24 DIAGNOSIS — N6311 Unspecified lump in the right breast, upper outer quadrant: Secondary | ICD-10-CM

## 2022-09-24 NOTE — Progress Notes (Signed)
Gynecology Referral Note  Patient is requesting referral to Surgery for evaluation/removal of breast mass. As per Dr. Romona Curls note on 02/26/2022, she was noted to have a 1 cm rubbery, tender mass in upper outer quadrant of right breast.  She had benign follow up imaging studies (see reports below) but she feels it is still painful and she has issues wearing bras.  She desires removal.    MM DIAG BREAST TOMO BILATERAL Result Date: 03/22/2022 CLINICAL DATA:  Focal pain and lump in the low right axillary region. EXAM: DIGITAL DIAGNOSTIC BILATERAL MAMMOGRAM WITH TOMOSYNTHESIS AND CAD; ULTRASOUND RIGHT BREAST LIMITED TECHNIQUE: Bilateral digital diagnostic mammography and breast tomosynthesis was performed. The images were evaluated with computer-aided detection.; Targeted ultrasound examination of the right breast was performed COMPARISON:  Previous exam(s). ACR Breast Density Category b: There are scattered areas of fibroglandular density. FINDINGS: No suspicious masses, calcifications, or distortion identified in either breast. On physical exam, no suspicious lumps are identified. Targeted ultrasound is performed, showing no sonographic abnormality in the region of the patient's symptoms. IMPRESSION: No mammographic or sonographic evidence of malignancy. RECOMMENDATION: Treatment of the patient's symptoms should be based on clinical and physical exam given lack of imaging findings. Recommend annual screening mammography. I have discussed the findings and recommendations with the patient. If applicable, a reminder letter will be sent to the patient regarding the next appointment. BI-RADS CATEGORY  2: Benign. Electronically Signed   By: Dorise Bullion III M.D.   On: 03/22/2022 12:59  US BREAST LTD UNI RIGHT INC AXILLA Result Date: 03/22/2022 CLINICAL DATA:  Focal pain and lump in the low right axillary region. EXAM: DIGITAL DIAGNOSTIC BILATERAL MAMMOGRAM WITH TOMOSYNTHESIS AND CAD; ULTRASOUND RIGHT BREAST  LIMITED TECHNIQUE: Bilateral digital diagnostic mammography and breast tomosynthesis was performed. The images were evaluated with computer-aided detection.; Targeted ultrasound examination of the right breast was performed COMPARISON:  Previous exam(s). ACR Breast Density Category b: There are scattered areas of fibroglandular density. FINDINGS: No suspicious masses, calcifications, or distortion identified in either breast. On physical exam, no suspicious lumps are identified. Targeted ultrasound is performed, showing no sonographic abnormality in the region of the patient's symptoms. IMPRESSION: No mammographic or sonographic evidence of malignancy. RECOMMENDATION: Treatment of the patient's symptoms should be based on clinical and physical exam given lack of imaging findings. Recommend annual screening mammography. I have discussed the findings and recommendations with the patient. If applicable, a reminder letter will be sent to the patient regarding the next appointment. BI-RADS CATEGORY  2: Benign. Electronically Signed   By: Dorise Bullion III M.D.   On: 03/22/2022 12:59    Patient is requesting referral to [facility], referral was done. Will follow up their recommendations.   Verita Schneiders, MD, [place] for [employer], [place]

## 2022-09-26 MED ORDER — CHLORTHALIDONE 25 MG PO TABS
25.0000 mg | ORAL_TABLET | Freq: Every day | ORAL | 2 refills | Status: DC
  Filled 2022-09-26 – 2022-12-18 (×2): qty 90, 90d supply, fill #0
  Filled 2023-03-10: qty 90, 90d supply, fill #1
  Filled 2023-06-09: qty 90, 90d supply, fill #2

## 2022-09-27 ENCOUNTER — Other Ambulatory Visit (HOSPITAL_COMMUNITY): Payer: Self-pay

## 2022-10-12 ENCOUNTER — Other Ambulatory Visit (HOSPITAL_COMMUNITY): Payer: Self-pay

## 2022-10-16 DIAGNOSIS — Z79899 Other long term (current) drug therapy: Secondary | ICD-10-CM | POA: Diagnosis not present

## 2022-10-16 DIAGNOSIS — M1991 Primary osteoarthritis, unspecified site: Secondary | ICD-10-CM | POA: Diagnosis not present

## 2022-10-16 DIAGNOSIS — M25512 Pain in left shoulder: Secondary | ICD-10-CM | POA: Diagnosis not present

## 2022-10-16 DIAGNOSIS — E669 Obesity, unspecified: Secondary | ICD-10-CM | POA: Diagnosis not present

## 2022-10-16 DIAGNOSIS — M0579 Rheumatoid arthritis with rheumatoid factor of multiple sites without organ or systems involvement: Secondary | ICD-10-CM | POA: Diagnosis not present

## 2022-10-16 DIAGNOSIS — Z6839 Body mass index (BMI) 39.0-39.9, adult: Secondary | ICD-10-CM | POA: Diagnosis not present

## 2022-10-17 ENCOUNTER — Other Ambulatory Visit (HOSPITAL_COMMUNITY): Payer: Self-pay

## 2022-10-23 DIAGNOSIS — H5213 Myopia, bilateral: Secondary | ICD-10-CM | POA: Diagnosis not present

## 2022-10-23 DIAGNOSIS — H524 Presbyopia: Secondary | ICD-10-CM | POA: Diagnosis not present

## 2022-10-23 DIAGNOSIS — H52203 Unspecified astigmatism, bilateral: Secondary | ICD-10-CM | POA: Diagnosis not present

## 2022-10-29 ENCOUNTER — Other Ambulatory Visit: Payer: Self-pay | Admitting: Surgery

## 2022-10-29 DIAGNOSIS — Q831 Accessory breast: Secondary | ICD-10-CM | POA: Diagnosis not present

## 2022-11-06 ENCOUNTER — Other Ambulatory Visit (HOSPITAL_COMMUNITY): Payer: Self-pay

## 2022-11-12 ENCOUNTER — Other Ambulatory Visit (HOSPITAL_COMMUNITY): Payer: Self-pay

## 2022-11-19 ENCOUNTER — Other Ambulatory Visit (HOSPITAL_COMMUNITY): Payer: Self-pay

## 2022-12-18 ENCOUNTER — Other Ambulatory Visit: Payer: Self-pay

## 2022-12-18 ENCOUNTER — Other Ambulatory Visit (HOSPITAL_COMMUNITY): Payer: Self-pay

## 2022-12-18 ENCOUNTER — Other Ambulatory Visit (HOSPITAL_BASED_OUTPATIENT_CLINIC_OR_DEPARTMENT_OTHER): Payer: Self-pay | Admitting: Family Medicine

## 2022-12-19 ENCOUNTER — Other Ambulatory Visit (HOSPITAL_COMMUNITY): Payer: Self-pay

## 2022-12-19 MED ORDER — LISINOPRIL 20 MG PO TABS
10.0000 mg | ORAL_TABLET | Freq: Every day | ORAL | 2 refills | Status: DC
  Filled 2022-12-19: qty 45, 90d supply, fill #0
  Filled 2023-03-10: qty 45, 90d supply, fill #1
  Filled 2023-06-09: qty 45, 90d supply, fill #2

## 2022-12-20 ENCOUNTER — Other Ambulatory Visit (HOSPITAL_COMMUNITY): Payer: Self-pay

## 2022-12-20 MED ORDER — METHOTREXATE SODIUM 2.5 MG PO TABS
20.0000 mg | ORAL_TABLET | ORAL | 0 refills | Status: DC
  Filled 2022-12-20: qty 96, 84d supply, fill #0

## 2022-12-25 ENCOUNTER — Encounter (HOSPITAL_BASED_OUTPATIENT_CLINIC_OR_DEPARTMENT_OTHER): Payer: Self-pay | Admitting: Family Medicine

## 2022-12-25 ENCOUNTER — Ambulatory Visit (HOSPITAL_BASED_OUTPATIENT_CLINIC_OR_DEPARTMENT_OTHER): Payer: Commercial Managed Care - PPO

## 2022-12-25 ENCOUNTER — Other Ambulatory Visit (HOSPITAL_BASED_OUTPATIENT_CLINIC_OR_DEPARTMENT_OTHER): Payer: Self-pay

## 2022-12-25 DIAGNOSIS — Z0000 Encounter for general adult medical examination without abnormal findings: Secondary | ICD-10-CM

## 2022-12-26 LAB — COMPREHENSIVE METABOLIC PANEL
ALT: 36 IU/L — ABNORMAL HIGH (ref 0–32)
AST: 30 IU/L (ref 0–40)
Albumin/Globulin Ratio: 1.8 (ref 1.2–2.2)
Albumin: 4.5 g/dL (ref 3.8–4.9)
Alkaline Phosphatase: 56 IU/L (ref 44–121)
BUN/Creatinine Ratio: 16 (ref 9–23)
BUN: 12 mg/dL (ref 6–24)
Bilirubin Total: 0.3 mg/dL (ref 0.0–1.2)
CO2: 28 mmol/L (ref 20–29)
Calcium: 9.9 mg/dL (ref 8.7–10.2)
Chloride: 97 mmol/L (ref 96–106)
Creatinine, Ser: 0.73 mg/dL (ref 0.57–1.00)
Globulin, Total: 2.5 g/dL (ref 1.5–4.5)
Glucose: 88 mg/dL (ref 70–99)
Potassium: 4 mmol/L (ref 3.5–5.2)
Sodium: 139 mmol/L (ref 134–144)
Total Protein: 7 g/dL (ref 6.0–8.5)
eGFR: 95 mL/min/{1.73_m2} (ref >59)

## 2022-12-26 LAB — LIPID PANEL
Chol/HDL Ratio: 4.3 ratio (ref 0.0–4.4)
Cholesterol, Total: 245 mg/dL — ABNORMAL HIGH (ref 100–199)
HDL: 57 mg/dL (ref >39)
LDL Chol Calc (NIH): 164 mg/dL — ABNORMAL HIGH (ref 0–99)
Triglycerides: 132 mg/dL (ref 0–149)
VLDL Cholesterol Cal: 24 mg/dL (ref 5–40)

## 2022-12-26 LAB — CBC WITH DIFFERENTIAL/PLATELET
Basophils Absolute: 0 10*3/uL (ref 0.0–0.2)
Basos: 1 %
EOS (ABSOLUTE): 0.1 10*3/uL (ref 0.0–0.4)
Eos: 2 %
Hematocrit: 41.8 % (ref 34.0–46.6)
Hemoglobin: 13.8 g/dL (ref 11.1–15.9)
Immature Grans (Abs): 0 10*3/uL (ref 0.0–0.1)
Immature Granulocytes: 0 %
Lymphocytes Absolute: 3 10*3/uL (ref 0.7–3.1)
Lymphs: 44 %
MCH: 30.6 pg (ref 26.6–33.0)
MCHC: 33 g/dL (ref 31.5–35.7)
MCV: 93 fL (ref 79–97)
Monocytes Absolute: 0.5 10*3/uL (ref 0.1–0.9)
Monocytes: 7 %
Neutrophils Absolute: 3.2 10*3/uL (ref 1.4–7.0)
Neutrophils: 46 %
Platelets: 300 10*3/uL (ref 150–450)
RBC: 4.51 x10E6/uL (ref 3.77–5.28)
RDW: 14 % (ref 11.7–15.4)
WBC: 6.8 10*3/uL (ref 3.4–10.8)

## 2022-12-26 LAB — TSH+FREE T4
Free T4: 1.24 ng/dL (ref 0.82–1.77)
TSH: 1.23 u[IU]/mL (ref 0.450–4.500)

## 2022-12-26 LAB — HEMOGLOBIN A1C
Est. average glucose Bld gHb Est-mCnc: 114 mg/dL
Hgb A1c MFr Bld: 5.6 % (ref 4.8–5.6)

## 2022-12-28 ENCOUNTER — Other Ambulatory Visit: Payer: Self-pay

## 2023-01-01 ENCOUNTER — Encounter (HOSPITAL_BASED_OUTPATIENT_CLINIC_OR_DEPARTMENT_OTHER): Payer: Self-pay | Admitting: Family Medicine

## 2023-01-01 ENCOUNTER — Ambulatory Visit: Payer: 59 | Admitting: Skilled Nursing Facility1

## 2023-01-01 ENCOUNTER — Ambulatory Visit (INDEPENDENT_AMBULATORY_CARE_PROVIDER_SITE_OTHER): Payer: Commercial Managed Care - PPO | Admitting: Family Medicine

## 2023-01-01 ENCOUNTER — Encounter (HOSPITAL_BASED_OUTPATIENT_CLINIC_OR_DEPARTMENT_OTHER): Payer: 59 | Admitting: Nurse Practitioner

## 2023-01-01 VITALS — BP 141/78 | HR 97 | Temp 97.7°F | Ht 66.0 in | Wt 245.2 lb

## 2023-01-01 DIAGNOSIS — Z0000 Encounter for general adult medical examination without abnormal findings: Secondary | ICD-10-CM | POA: Diagnosis not present

## 2023-01-01 NOTE — Assessment & Plan Note (Signed)
Routine HCM labs reviewed. HCM reviewed/discussed. Anticipatory guidance regarding healthy weight, lifestyle and choices given. Recommend healthy diet.  Recommend approximately 150 minutes/week of moderate intensity exercise Recommend regular dental and vision exams Always use seatbelt/lap and shoulder restraints Recommend using smoke alarms and checking batteries at least twice a year Recommend using sunscreen when outside Discussed colon cancer screening recommendations, options.  Patient is UTD Discussed recommendations for shingles vaccine.  Patient unable to receive due to being on Humira Discussed tetanus immunization recommendations, patient is UTD

## 2023-01-01 NOTE — Patient Instructions (Signed)
  Medication Instructions:  Your physician recommends that you continue on your current medications as directed. Please refer to the Current Medication list given to you today. --If you need a refill on any your medications before your next appointment, please call your pharmacy first. If no refills are authorized on file call the office.-- Lab Work: Your physician has recommended that you have lab work today: No If you have labs (blood work) drawn today and your tests are completely normal, you will receive your results via MyChart message OR a phone call from our staff.  Please ensure you check your voicemail in the event that you authorized detailed messages to be left on a delegated number. If you have any lab test that is abnormal or we need to change your treatment, we will call you to review the results.  Referrals/Procedures/Imaging: No  Follow-Up: Your next appointment:   Your physician recommends that you schedule a follow-up appointment in: 4-6 months with Dr. de Cuba.  You will receive a text message or e-mail with a link to a survey about your care and experience with us today! We would greatly appreciate your feedback!   Thanks for letting us be apart of your health journey!!  Primary Care and Sports Medicine   Dr. Raymond de Cuba   We encourage you to activate your patient portal called "MyChart".  Sign up information is provided on this After Visit Summary.  MyChart is used to connect with patients for Virtual Visits (Telemedicine).  Patients are able to view lab/test results, encounter notes, upcoming appointments, etc.  Non-urgent messages can be sent to your provider as well. To learn more about what you can do with MyChart, please visit --  [email or web address].    

## 2023-01-01 NOTE — Progress Notes (Signed)
Subjective:    CC: Annual Physical Exam  HPI:  [name] is a 62 y.o. presenting for annual physical  I reviewed the past medical history, family history, social history, surgical history, and allergies today and no changes were needed.  Please see the problem list section below in epic for further details.  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 08/23/2021   08/23/21 Surgical pathology UTERUS, CERVIX, BILATERAL FALLOPIAN TUBES, HYSTERECTOMY AND SALPINGECTOMY:  - Foci of complex endometrial hyperplasia with focal atypia  - No evidence of invasive carcinoma  - Benign unremarkable cervix  - Benign unremarkable bilateral fallopian tubes   GERD (gastroesophageal reflux disease)    Helicobacter pylori antibody positive 2006   Tx w/ PPI, amoxicillin and Biaxin   Hemorrhoids    History of gestational diabetes    yrs ago with 1st pregnancy   Hyperlipidemia    Hypertension    Obesity    PMB (postmenopausal bleeding) 08/15/2021   Pneumonia    as child   Postmenopausal bleeding 06/23/2021   Ultrasound showed 8.6 mm EM, 3 cm fibroid   Psoriasis 08/15/2021   PVC's (premature ventricular contractions)    occasional R/O cardiac via [facility] yrs ago   Rheumatoid arthritis ([place])    Wears glasses    or contacts   Past Surgical History: Past Surgical History:  Procedure Laterality Date   CERVICAL POLYPECTOMY  12/22/2012   Procedure: CERVICAL POLYPECTOMY;  Surgeon: Thurnell Lose, MD;  Location: [facility];  Service:  Gynecology;  Laterality: N/A;   CESAREAN SECTION     x 2 1999 and 2001   COLONOSCOPY     colonscopy and endoscopy  08/05/2020   HYSTEROSCOPY WITH D & C  12/22/2012   Procedure: DILATATION AND CURETTAGE /HYSTEROSCOPY;  Surgeon: Thurnell Lose, MD;  Location: [facility];  Service: Gynecology;;   LAPAROSCOPIC VAGINAL HYSTERECTOMY WITH SALPINGECTOMY Bilateral 08/23/2021   Procedure: LAPAROSCOPIC ASSISTED VAGINAL HYSTERECTOMY WITH BILATERAL SALPINGECTOMY;  Surgeon: Osborne Oman, MD;  Location: [facility];  Service: Gynecology;  Laterality: Bilateral;   PLANTAR'S WART EXCISION     8 yrs ago per pt on 08-15-2021   Social History: Social History   Socioeconomic History   Marital status: Married    Spouse name: Not on file   Number of children: 2   Years of education: Not on file   Highest education level: Not on file  Occupational History   Occupation: MSN    Employer: [employer]  Tobacco Use   Smoking status: Never   Smokeless tobacco: Never  Vaping Use   Vaping Use: Never used  Substance and Sexual Activity   Alcohol use: Yes    Comment: occ   Drug use: No   Sexual activity: Yes    Birth control/protection: Post-menopausal  Other Topics Concern   Not on file  Social History Narrative   Daily caffeine    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  Review of Systems: No headache, visual changes, nausea, vomiting, diarrhea, constipation, dizziness, abdominal pain, skin rash, fevers, chills, night sweats, swollen lymph nodes, weight loss, chest pain, body aches, joint swelling, muscle aches, shortness of breath, mood changes, visual or auditory hallucinations.  Objective:    BP (!) 141/78 (BP Location: Right Arm, Patient Position: Sitting, Cuff Size: Large)   Pulse 97   Temp 97.7 F (36.5 C) (Oral)   Ht '5\' 6"'$  (1.676 m)   Wt 245 lb 3.2 oz (111.2 kg)   LMP 03/29/2020 (Approximate)   SpO2 99%   BMI 39.58 kg/m   General: Well Developed, well nourished, and in no acute distress. Neuro: Alert and oriented x3, extra-ocular muscles intact, sensation grossly intact. Cranial nerves II through XII are intact, motor, sensory, and coordinative functions are all intact. HEENT: Normocephalic, atraumatic, pupils equal round reactive to light, neck supple, no masses, no lymphadenopathy, thyroid nonpalpable. Oropharynx, nasopharynx, external ear canals are unremarkable. Skin: Warm and dry, no rashes noted.  Cardiac: Regular rate and rhythm, no murmurs rubs or gallops.  Respiratory: Clear to auscultation bilaterally. Not using accessory muscles, speaking in full sentences. Abdominal: Soft, nontender, nondistended, positive bowel sounds, no masses, no organomegaly. Musculoskeletal: Shoulder, elbow, wrist, hip, knee, ankle stable, and with full range of motion.  Impression and Recommendations:    Wellness examination Routine HCM labs reviewed. HCM reviewed/discussed. Anticipatory guidance regarding healthy weight, lifestyle and choices given. Recommend healthy diet.  Recommend approximately 150 minutes/week of moderate intensity exercise Recommend regular dental and vision exams Always use seatbelt/lap and shoulder restraints Recommend using smoke alarms and checking batteries at least twice a year Recommend using sunscreen when  outside Discussed colon cancer screening recommendations, options.  Patient is UTD Discussed recommendations for shingles vaccine.  Patient unable to receive due to being on Humira Discussed tetanus immunization recommendations, patient is UTD  Return in about 6 months (around 07/02/2023) for HTN.   ___________________________________________ Shaft Corigliano de [place], MD, ABFM, [facility] and [place]

## 2023-01-03 ENCOUNTER — Other Ambulatory Visit (HOSPITAL_COMMUNITY): Payer: Self-pay

## 2023-01-04 ENCOUNTER — Other Ambulatory Visit (HOSPITAL_COMMUNITY): Payer: Self-pay

## 2023-01-08 ENCOUNTER — Other Ambulatory Visit (HOSPITAL_COMMUNITY): Payer: Self-pay

## 2023-01-15 DIAGNOSIS — M0579 Rheumatoid arthritis with rheumatoid factor of multiple sites without organ or systems involvement: Secondary | ICD-10-CM | POA: Diagnosis not present

## 2023-01-30 ENCOUNTER — Other Ambulatory Visit (HOSPITAL_COMMUNITY): Payer: Self-pay

## 2023-01-31 ENCOUNTER — Other Ambulatory Visit (HOSPITAL_COMMUNITY): Payer: Self-pay

## 2023-02-01 ENCOUNTER — Other Ambulatory Visit (HOSPITAL_COMMUNITY): Payer: Self-pay

## 2023-02-04 ENCOUNTER — Other Ambulatory Visit: Payer: Self-pay

## 2023-02-04 ENCOUNTER — Other Ambulatory Visit (HOSPITAL_COMMUNITY): Payer: Self-pay

## 2023-02-04 ENCOUNTER — Other Ambulatory Visit: Payer: Self-pay | Admitting: Pharmacist

## 2023-02-04 MED ORDER — HUMIRA (2 PEN) 40 MG/0.4ML ~~LOC~~ AJKT
AUTO-INJECTOR | SUBCUTANEOUS | 5 refills | Status: DC
  Filled 2023-02-04: qty 2, 28d supply, fill #0
  Filled 2023-03-01: qty 2, 28d supply, fill #1
  Filled 2023-03-26: qty 2, 28d supply, fill #2
  Filled 2023-04-25: qty 2, 28d supply, fill #3
  Filled 2023-06-03: qty 2, 28d supply, fill #4
  Filled 2023-07-05: qty 2, 28d supply, fill #5

## 2023-02-04 MED ORDER — HUMIRA (2 PEN) 40 MG/0.4ML ~~LOC~~ AJKT: AUTO-INJECTOR | SUBCUTANEOUS | 5 refills | Status: DC

## 2023-02-05 ENCOUNTER — Other Ambulatory Visit (HOSPITAL_COMMUNITY): Payer: Self-pay

## 2023-02-07 ENCOUNTER — Other Ambulatory Visit (HOSPITAL_COMMUNITY): Payer: Self-pay

## 2023-02-21 ENCOUNTER — Other Ambulatory Visit: Payer: Self-pay

## 2023-02-21 ENCOUNTER — Other Ambulatory Visit (HOSPITAL_COMMUNITY): Payer: Self-pay

## 2023-02-21 MED ORDER — MELOXICAM 15 MG PO TABS
15.0000 mg | ORAL_TABLET | Freq: Every day | ORAL | 1 refills | Status: DC
  Filled 2023-02-21: qty 90, 90d supply, fill #0
  Filled 2023-03-10 – 2023-05-24 (×2): qty 90, 90d supply, fill #1

## 2023-02-25 ENCOUNTER — Ambulatory Visit (INDEPENDENT_AMBULATORY_CARE_PROVIDER_SITE_OTHER): Payer: Commercial Managed Care - PPO | Admitting: Obstetrics & Gynecology

## 2023-02-25 ENCOUNTER — Encounter: Payer: Self-pay | Admitting: Obstetrics & Gynecology

## 2023-02-25 VITALS — BP 123/85 | HR 94 | Ht 66.75 in | Wt 244.0 lb

## 2023-02-25 DIAGNOSIS — Z90710 Acquired absence of both cervix and uterus: Secondary | ICD-10-CM | POA: Diagnosis not present

## 2023-02-25 DIAGNOSIS — Z01419 Encounter for gynecological examination (general) (routine) without abnormal findings: Secondary | ICD-10-CM | POA: Diagnosis not present

## 2023-02-25 DIAGNOSIS — Z1231 Encounter for screening mammogram for malignant neoplasm of breast: Secondary | ICD-10-CM | POA: Diagnosis not present

## 2023-02-25 LAB — POCT URINALYSIS DIP (DEVICE)
Bilirubin Urine: NEGATIVE
Glucose, UA: NEGATIVE mg/dL
Hgb urine dipstick: NEGATIVE
Ketones, ur: NEGATIVE mg/dL
Leukocytes,Ua: NEGATIVE
Nitrite: NEGATIVE
Protein, ur: NEGATIVE mg/dL
Specific Gravity, Urine: 1.025 (ref 1.005–1.030)
Urobilinogen, UA: 0.2 mg/dL (ref 0.0–1.0)
pH: 6 (ref 5.0–8.0)

## 2023-02-25 NOTE — Progress Notes (Signed)
GYNECOLOGY ANNUAL PREVENTATIVE CARE ENCOUNTER NOTE  History:     [name] is a 62 y.o. G40P2002 PMP female s/p LAVH in 2022 for complex atypical endometrial hyperplasia here today for annual examination. Reports occasional vulvovaginal dryness, but ameliorated with lubricants.  Denies abnormal vaginal bleeding, discharge, pelvic pain, problems with intercourse or other gynecologic concerns.    Gynecologic History Patient's last menstrual period was 03/29/2020 (approximate). Contraception: status post hysterectomy Last Mammogram: 03/22/2022.  Result was benign Last Colonoscopy: 08/05/2020.  Result was benign.  Obstetric History OB History  Gravida Para Term Preterm AB Living  '2 2 2 '$ 0 0 2  SAB IAB Ectopic Multiple Live Births  0 0 0 0 2    # Outcome Date GA Lbr Len/2nd Weight Sex Delivery Anes PTL Lv  2 Term 12/29/99 [redacted]w[redacted]d 9 lb 6 oz (4.252 kg) F CS-LTranv Spinal  LIV  1 Term 02/07/98 [id number] lb 15 oz (4.054 kg) M CS-LTranv EPI  LIV     Complications: Failure to Progress in Second Stage    Past Medical History:  Diagnosis Date   Arthritis    rheumatoid arthritis   Asthma    as a child    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 08/23/2021   08/23/21 Surgical pathology UTERUS, CERVIX, BILATERAL FALLOPIAN TUBES, HYSTERECTOMY AND SALPINGECTOMY:  - Foci of complex endometrial hyperplasia with focal atypia  - No evidence of invasive carcinoma  - Benign unremarkable cervix  - Benign unremarkable bilateral fallopian tubes   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 08/23/2021   08/23/21 Surgical pathology UTERUS, CERVIX, BILATERAL FALLOPIAN TUBES, HYSTERECTOMY AND SALPINGECTOMY:  - Foci of complex endometrial hyperplasia with focal atypia  - No evidence of invasive carcinoma  - Benign unremarkable cervix  - Benign unremarkable bilateral fallopian tubes   GERD (gastroesophageal reflux disease)    Helicobacter pylori antibody positive 2006   Tx w/ PPI,  amoxicillin and Biaxin   Hemorrhoids    History of gestational diabetes    yrs ago with 1st pregnancy   Hyperlipidemia    Hypertension    Obesity    PMB (postmenopausal bleeding) 08/15/2021   Pneumonia    as child   Postmenopausal bleeding 06/23/2021   Ultrasound showed 8.6 mm EM, 3 cm fibroid   Psoriasis 08/15/2021   PVC's (premature ventricular contractions)    occasional R/O cardiac via [facility] yrs ago   Rheumatoid arthritis ([place]   Wears glasses    or contacts    Past Surgical History:  Procedure Laterality Date   CERVICAL POLYPECTOMY  12/22/2012   Procedure: CERVICAL POLYPECTOMY;  Surgeon: EvThurnell LoseMD;  [facility];  Service: Gynecology;  Laterality: N/A;   CESAREAN SECTION     x 2 1999 and 2001   COLONOSCOPY     colonscopy and endoscopy  08/05/2020   HYSTEROSCOPY WITH D & C  12/22/2012   Procedure: DILATATION AND CURETTAGE /HYSTEROSCOPY;  Surgeon: EvThurnell LoseMD;  [facility];  Service: Gynecology;;   LAPAROSCOPIC VAGINAL HYSTERECTOMY WITH SALPINGECTOMY Bilateral 08/23/2021   Procedure: LAPAROSCOPIC ASSISTED VAGINAL HYSTERECTOMY WITH BILATERAL SALPINGECTOMY;  Surgeon: AnOsborne OmanMD;  [facility] Service: Gy[facility];  Laterality: Bilateral;   PLANTAR'S WART EXCISION     8 yrs ago per pt on 08-15-2021    Current Outpatient Medications on File Prior to Visit  Medication Sig Dispense Refill   acetaminophen (TYLENOL) 325 MG tablet Take 650 mg by  mouth as needed.     Adalimumab (HUMIRA, 2 PEN,) 40 MG/0.4ML PNKT INJECT 40 MG Subcutaneous EVERY OTHER WEEK 30 days 2 each 5   AMBULATORY NON FORMULARY MEDICATION Diltiazem gel 2% mixed with Lidocaine 5% Sig: apply a pea size amount twice daily to rectum 30 g 1   Ascorbic Acid (VITA-C PO) Take by mouth.     calcium carbonate (TUMS - DOSED IN MG ELEMENTAL CALCIUM) 500 MG chewable tablet Chew 1 tablet by mouth as needed.     chlorthalidone (HYGROTON) 25 MG tablet Take 1 tablet (25  mg total) by mouth daily. 90 tablet 2   Cholecalciferol (VITAMIN D) 2000 UNITS CAPS Take 1 capsule by mouth daily.     Famotidine-Ca Carb-Mag Hydrox (PEPCID COMPLETE PO) Take by mouth as needed.     fluticasone (FLONASE) 50 MCG/ACT nasal spray Place into both nostrils daily.     folic acid (FOLVITE) 1 MG tablet Take 2 tablets (2 mg total) by mouth daily. 180 tablet 3   levocetirizine (XYZAL) 5 MG tablet Take 5 mg by mouth every evening.     lisinopril (ZESTRIL) 20 MG tablet Take 0.5 tablet (10 mg total) by mouth daily. 45 tablet 2   meloxicam (MOBIC) 15 MG tablet Take 1 tablet (15 mg total) by mouth daily. 90 tablet 1   methotrexate (RHEUMATREX) 2.5 MG tablet Take 8 tablets (20 mg total) by mouth once a week. 96 tablet 0   Multiple Vitamin (MULTIVITAMIN) tablet Take 1 tablet by mouth daily.     No current facility-administered medications on file prior to visit.    Allergies  Allergen Reactions   Amlodipine Swelling and Other (See Comments)    petichane   Hydroxychloroquine Nausea And Vomiting    Social History:  reports that she has never smoked. She has never used smokeless tobacco. She reports current alcohol use. She reports that she does not use drugs.  Family History  Problem Relation Age of Onset   Diabetes Brother        x 2    Colon cancer Neg Hx     The following portions of the patient's history were reviewed and updated as appropriate: allergies, current medications, past family history, past medical history, past social history, past surgical history and problem list.  Review of Systems Pertinent items noted in HPI and remainder of comprehensive ROS otherwise negative.  Physical Exam:  BP 123/85   Pulse 94   Ht 5' 6.75" (1.695 m)   Wt 244 lb (110.7 kg)   LMP 03/29/2020 (Approximate)   BMI 38.50 kg/m  CONSTITUTIONAL: Well-developed, well-nourished female in no acute distress.  HENT:  Normocephalic, atraumatic, External right and left ear normal.  EYES:  Conjunctivae and EOM are normal. Pupils are equal, round, and reactive to light. No scleral icterus.  NECK: Normal range of motion, supple, no masses.  Normal thyroid.  SKIN: Skin is warm and dry. No rash noted. Not diaphoretic. No erythema. No pallor. MUSCULOSKELETAL: Normal range of motion. No tenderness.  No cyanosis, clubbing, or edema. NEUROLOGIC: Alert and oriented to person, place, and time. Normal reflexes, muscle tone coordination.  PSYCHIATRIC: Normal mood and affect. Normal behavior. Normal judgment and thought content. CARDIOVASCULAR: Normal heart rate noted, regular rhythm RESPIRATORY: Clear to auscultation bilaterally. Effort and breath sounds normal, no problems with respiration noted. BREASTS: Symmetric in size. No masses, tenderness, skin changes, nipple drainage, or lymphadenopathy bilaterally. Performed in the presence of a chaperone. ABDOMEN: Soft, no distention noted.  No  tenderness, rebound or guarding.  PELVIC: Normal appearing external genitalia; normal urethral meatus.  No abnormal discharge noted.   Speculum exam deferred, bimanual done.  Vaginal cuff palpated, no concerns, no adnexal tenderness. Performed in the presence of a chaperone.   Assessment and Plan:     1. S/P laparoscopic assisted vaginal hysterectomy (LAVH) No issues.  2. Breast cancer screening by mammogram Mammogram scheduled - MM 3D SCREEN BREAST BILATERAL; Future  3. Well woman exam with routine gynecological exam Normal pelvic exam s/p LAVH. Discussed use of hyaluronic vaginal moisturizers for vaginal dryness, will consider estrogen vaginal therapy if worsens.  Colon cancer screening is up to date. Routine preventative health maintenance measures emphasized. Please refer to After Visit Summary for other counseling recommendations.      Verita Schneiders, MD, [place] for [employer], [place]

## 2023-02-25 NOTE — Patient Instructions (Signed)
[facility] or [place]

## 2023-02-28 ENCOUNTER — Other Ambulatory Visit (HOSPITAL_COMMUNITY): Payer: Self-pay

## 2023-03-01 ENCOUNTER — Other Ambulatory Visit (HOSPITAL_COMMUNITY): Payer: Self-pay

## 2023-03-05 ENCOUNTER — Other Ambulatory Visit (HOSPITAL_COMMUNITY): Payer: Self-pay

## 2023-03-10 ENCOUNTER — Other Ambulatory Visit (HOSPITAL_COMMUNITY): Payer: Self-pay

## 2023-03-11 ENCOUNTER — Other Ambulatory Visit (HOSPITAL_COMMUNITY): Payer: Self-pay

## 2023-03-11 ENCOUNTER — Other Ambulatory Visit: Payer: Self-pay

## 2023-03-11 MED ORDER — METHOTREXATE SODIUM 2.5 MG PO TABS
ORAL_TABLET | ORAL | 0 refills | Status: DC
  Filled 2023-03-11: qty 96, 84d supply, fill #0

## 2023-03-12 ENCOUNTER — Other Ambulatory Visit (HOSPITAL_COMMUNITY): Payer: Self-pay

## 2023-03-13 ENCOUNTER — Other Ambulatory Visit (HOSPITAL_COMMUNITY): Payer: Self-pay

## 2023-03-25 ENCOUNTER — Other Ambulatory Visit: Payer: Self-pay

## 2023-03-26 ENCOUNTER — Other Ambulatory Visit (HOSPITAL_COMMUNITY): Payer: Self-pay

## 2023-04-01 ENCOUNTER — Other Ambulatory Visit: Payer: Self-pay

## 2023-04-04 ENCOUNTER — Ambulatory Visit
Admission: RE | Admit: 2023-04-04 | Discharge: 2023-04-04 | Disposition: A | Discharge status: Home / Self Care | Payer: Commercial Managed Care - PPO | Source: Ambulatory Visit | Attending: Obstetrics & Gynecology | Admitting: Obstetrics & Gynecology

## 2023-04-04 DIAGNOSIS — Z1231 Encounter for screening mammogram for malignant neoplasm of breast: Secondary | ICD-10-CM | POA: Diagnosis not present

## 2023-04-09 ENCOUNTER — Other Ambulatory Visit: Payer: Self-pay | Admitting: Rheumatology

## 2023-04-09 DIAGNOSIS — E669 Obesity, unspecified: Secondary | ICD-10-CM | POA: Diagnosis not present

## 2023-04-09 DIAGNOSIS — M1991 Primary osteoarthritis, unspecified site: Secondary | ICD-10-CM | POA: Diagnosis not present

## 2023-04-09 DIAGNOSIS — M0579 Rheumatoid arthritis with rheumatoid factor of multiple sites without organ or systems involvement: Secondary | ICD-10-CM | POA: Diagnosis not present

## 2023-04-09 DIAGNOSIS — Z6838 Body mass index (BMI) 38.0-38.9, adult: Secondary | ICD-10-CM | POA: Diagnosis not present

## 2023-04-09 DIAGNOSIS — Z79899 Other long term (current) drug therapy: Secondary | ICD-10-CM | POA: Diagnosis not present

## 2023-04-11 ENCOUNTER — Other Ambulatory Visit (HOSPITAL_COMMUNITY): Payer: Self-pay

## 2023-04-11 MED ORDER — NYSTATIN 100000 UNIT/ML MT SUSP
5.0000 mL | Freq: Two times a day (BID) | OROMUCOSAL | 1 refills | Status: DC | PRN
  Filled 2023-04-11: qty 240, 12d supply, fill #0

## 2023-04-16 ENCOUNTER — Encounter (HOSPITAL_BASED_OUTPATIENT_CLINIC_OR_DEPARTMENT_OTHER): Payer: Self-pay | Admitting: Family Medicine

## 2023-04-16 ENCOUNTER — Ambulatory Visit (HOSPITAL_BASED_OUTPATIENT_CLINIC_OR_DEPARTMENT_OTHER): Payer: Commercial Managed Care - PPO | Admitting: Family Medicine

## 2023-04-16 DIAGNOSIS — U071 COVID-19: Secondary | ICD-10-CM | POA: Diagnosis not present

## 2023-04-16 MED ORDER — NIRMATRELVIR/RITONAVIR (PAXLOVID)TABLET: 3.0000 | ORAL_TABLET | Freq: Two times a day (BID) | ORAL | 0 refills | Status: AC

## 2023-04-16 MED ORDER — NIRMATRELVIR/RITONAVIR (PAXLOVID)TABLET: 3.0000 | ORAL_TABLET | Freq: Two times a day (BID) | ORAL | 0 refills | Status: DC

## 2023-04-16 NOTE — Progress Notes (Signed)
   Established Patient Office Visit  Subjective   Patient ID: [name], female    DOB: 05/10/1961  Age: 62 y.o. MRN: [id number]  Chief Complaint  Patient presents with   Covid Positive    Positive 04/15/2023, cough, fatigue, sneezing, headache   I connected with  Gerome Apley on 04/16/23 by a phone call and verified that I am speaking with the correct person using two identifiers.I discussed the limitations of evaluation and management by telemedicine. The patient expressed understanding and agreed to proceed.   Patient is currently in [facility] for vacation and she flew there on Saturday, 04/13/23. This is when she started to experience symptoms, including cough, fatigue, sneezing, headache, fever/chills. She rested once she got to the hotel Saturday night. She rested on Sunday. She felt a little better on Sunday but her throat was still bothering her. She reports she does feel symptomatic but better than yesterday. Her cough has improved. Denies chest pain, shortness of breath, difficulty breathing, lightheadedness, rapid heart rate.   Review of Systems  Constitutional:  Positive for malaise/fatigue. Negative for chills and fever.  HENT:  Positive for ear pain.   Eyes:  Negative for blurred vision and double vision.  Respiratory:  Positive for cough. Negative for shortness of breath.   Cardiovascular:  Negative for chest pain and palpitations.  Gastrointestinal:  Negative for abdominal pain, nausea and vomiting.  Musculoskeletal:  Negative for myalgias.  Neurological:  Negative for dizziness, weakness and headaches.  Psychiatric/Behavioral:  Negative for depression and suicidal ideas. The patient is not nervous/anxious.    Objective:    LMP 03/29/2020 (Approximate)  BP Readings from Last 3 Encounters:  02/25/23 123/85  01/01/23 (!) 141/78  09/04/22 (!) 123/59    Physical Exam Unable to perform due to physical limitations of visit being over the phone. Patient appeared to  be stable and not in any distress during the phone call. She was able to converse easily with not sounding dyspneic. Patient was calm and answered all questions appropriately.   Assessment & Plan:  1. COVID-19 Provided patient with reassurance that since she is feeling better, she would not necessarily benefit from treatment. Due to her past medical history and comorbid conditions, would be reasonable to start medication. She does fall within the timeframe to start the medication. Recent eGFR 95 done 12/25/2022. She does not know if she wants to take medication but would like me to send it to the pharmacy. Pharmacy verified with patient. Medication was sent.     Return if symptoms worsen or fail to improve.   Spent 8 minutes on this phone call/patient encounter, including preparation, chart review, counseling with patient and coordination of care, and documentation of encounter.    Alyson Reedy, FNP

## 2023-04-21 ENCOUNTER — Encounter: Payer: Self-pay | Admitting: Emergency Medicine

## 2023-04-21 ENCOUNTER — Ambulatory Visit
Admission: EM | Admit: 2023-04-21 | Discharge: 2023-04-21 | Disposition: A | Discharge status: Home / Self Care | Payer: Commercial Managed Care - PPO | Attending: Family Medicine | Admitting: Family Medicine

## 2023-04-21 DIAGNOSIS — R059 Cough, unspecified: Secondary | ICD-10-CM

## 2023-04-21 MED ORDER — BENZONATATE 200 MG PO CAPS: 200.0000 mg | ORAL_CAPSULE | Freq: Three times a day (TID) | ORAL | 0 refills | Status: AC | PRN

## 2023-04-21 MED ORDER — PREDNISONE 10 MG (21) PO TBPK: ORAL_TABLET | Freq: Every day | ORAL | 0 refills | Status: DC

## 2023-04-21 MED ORDER — PROMETHAZINE-DM 6.25-15 MG/5ML PO SYRP: 5.0000 mL | ORAL_SOLUTION | Freq: Two times a day (BID) | ORAL | 0 refills | Status: DC | PRN

## 2023-04-21 NOTE — Discharge Instructions (Addendum)
Instructed patient to take medications as directed with food to completion.  Advised may take Tessalon daily or as needed for cough.  Advised may use Promethazine DM at night for cough prior to sleep due to sedative effects.  Advised not to use cough medications together.  Encouraged increase daily water intake to 64 ounces per day while taking this medication.  Advised if symptoms worsen and/or unresolved please follow-up with PCP or here for further evaluation.

## 2023-04-21 NOTE — ED Triage Notes (Signed)
Patient did a home test on Monday and tested positive for COVID.  Patient was out of town, she did rest and started feeling better.  On Friday she started feeling bad with cough, congestion and chest tightness.  Patient has used her Albuterol inhaler, mucinex and delsym.

## 2023-04-21 NOTE — ED Provider Notes (Signed)
[name]    CSN: [id number] Arrival date & time: 04/21/23  1500      History   Chief Complaint Chief Complaint  Patient presents with   Cough    HPI [name] is a 62 y.o. female.   HPI 50 presents with cough and congestion for 2 days.  Patient reports testing positive for COVID-19 on Monday, had ED visit to call in antivirals patient did not end up taking Paxlovid as she felt better.  Reports is now using her albuterol inhaler to help.  PMH significant for morbid obesity, rheumatoid arthritis, and HTN.  Patient is accompanied by her husband today.  Request refill of albuterol inhaler.  Past Medical History:  Diagnosis Date   Arthritis    rheumatoid arthritis   Asthma    as a child    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 08/23/2021   08/23/21 Surgical pathology UTERUS, CERVIX, BILATERAL FALLOPIAN TUBES, HYSTERECTOMY AND SALPINGECTOMY:  - Foci of complex endometrial hyperplasia with focal atypia  - No evidence of invasive carcinoma  - Benign unremarkable cervix  - Benign unremarkable bilateral fallopian tubes   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 08/23/2021   08/23/21 Surgical pathology UTERUS, CERVIX, BILATERAL FALLOPIAN TUBES, HYSTERECTOMY AND SALPINGECTOMY:  - Foci of complex endometrial hyperplasia with focal atypia  - No evidence of invasive carcinoma  - Benign unremarkable cervix  - Benign unremarkable bilateral fallopian tubes   GERD (gastroesophageal reflux disease)    Helicobacter pylori antibody positive 2006   Tx w/ PPI, amoxicillin and Biaxin   Hemorrhoids    History of gestational diabetes    yrs ago with 1st pregnancy   Hyperlipidemia    Hypertension    Obesity    PMB (postmenopausal bleeding) 08/15/2021   Pneumonia    as child   Postmenopausal bleeding 06/23/2021   Ultrasound showed 8.6 mm EM, 3 cm fibroid   Psoriasis 08/15/2021   PVC's (premature ventricular contractions)    occasional R/O cardiac via [facility]  yrs ago   Rheumatoid arthritis (HCC)    Wears glasses    or contacts    Patient Active Problem List   Diagnosis Date Noted   COVID-19 04/16/2023   Rheumatoid arthritis involving multiple sites with positive rheumatoid factor (HCC) 09/22/2022   Sensorineural hearing loss (SNHL) of left ear with unrestricted hearing of right ear 09/22/2022   S/P laparoscopic assisted vaginal hysterectomy (LAVH) 08/23/2021   Rheumatoid arthritis(714.0) 01/22/2012   Hypertension 01/22/2012    Past Surgical History:  Procedure Laterality Date   CERVICAL POLYPECTOMY  12/22/2012   Procedure: CERVICAL POLYPECTOMY;  Surgeon: Geryl Rankins, MD;  [facility];  Service: Gynecology;  Laterality: N/A;   CESAREAN SECTION     x 2 1999 and 2001   COLONOSCOPY     colonscopy and endoscopy  08/05/2020   HYSTEROSCOPY WITH D & C  12/22/2012   Procedure: DILATATION AND CURETTAGE /HYSTEROSCOPY;  Surgeon: Geryl Rankins, MD;  [facility];  Service: Gynecology;;   LAPAROSCOPIC VAGINAL HYSTERECTOMY WITH SALPINGECTOMY Bilateral 08/23/2021   Procedure: LAPAROSCOPIC ASSISTED VAGINAL HYSTERECTOMY WITH BILATERAL SALPINGECTOMY;  Surgeon: Tereso Newcomer, MD;  Location: [facility];  Service: Gynecology;  Laterality: Bilateral;   PLANTAR'S WART EXCISION     8 yrs ago per pt on 08-15-2021    OB History     Gravida  2   Para  2   Term  2   Preterm  0   AB  0   Living  2      SAB  0   IAB  0   Ectopic  0   Multiple  0   Live Births  2            Home Medications    Prior to Admission medications   Medication Sig Start Date End Date Taking? Authorizing Provider  acetaminophen (TYLENOL) 325 MG tablet Take 650 mg by mouth as needed.   Yes [provider]  Adalimumab (HUMIRA, 2 PEN,) 40 MG/0.4ML PNKT INJECT 40 MG Subcutaneous EVERY OTHER WEEK 30 days 02/04/23  Yes Quentin Angst, MD  AMBULATORY NON FORMULARY MEDICATION Diltiazem gel 2% mixed with Lidocaine 5% Sig:  apply a pea size amount twice daily to rectum 09/08/21  Yes Iva Boop, MD  Ascorbic Acid (VITA-C PO) Take by mouth.   Yes [provider]  benzonatate (TESSALON) 200 MG capsule Take 1 capsule (200 mg total) by mouth 3 (three) times daily as needed for up to 7 days. 04/21/23 04/28/23 Yes Trevor Iha, FNP  calcium carbonate (TUMS - DOSED IN MG ELEMENTAL CALCIUM) 500 MG chewable tablet Chew 1 tablet by mouth as needed.   Yes [provider]  chlorthalidone (HYGROTON) 25 MG tablet Take 1 tablet (25 mg total) by mouth daily. 09/26/22  Yes Early, Sung Amabile, NP  Cholecalciferol (VITAMIN D) 2000 UNITS CAPS Take 1 capsule by mouth daily.   Yes [provider]  Famotidine-Ca Carb-Mag Hydrox (PEPCID COMPLETE PO) Take by mouth as needed.   Yes [provider]  fluticasone (FLONASE) 50 MCG/ACT nasal spray Place into both nostrils daily.   Yes [provider]  folic acid (FOLVITE) 1 MG tablet Take 2 tablets (2 mg total) by mouth daily. 05/23/22  Yes   levocetirizine (XYZAL) 5 MG tablet Take 5 mg by mouth every evening.   Yes [provider]  lisinopril (ZESTRIL) 20 MG tablet Take 0.5 tablet (10 mg total) by mouth daily. 12/19/22  Yes de [place], Raymond J, MD  magic mouthwash (nystatin, lidocaine, diphenhydrAMINE) suspension Swish and swallow 5-10 mLs by mouth 2 (two) times daily as needed. 04/11/23  Yes   meloxicam (MOBIC) 15 MG tablet Take 1 tablet (15 mg total) by mouth daily. 02/21/23  Yes   methotrexate (RHEUMATREX) 2.5 MG tablet Take 8 tablets (20 mg total) by mouth once a week. 03/11/23  Yes   Multiple Vitamin (MULTIVITAMIN) tablet Take 1 tablet by mouth daily.   Yes [provider]  predniSONE (STERAPRED UNI-PAK 21 TAB) 10 MG (21) TBPK tablet Take by mouth daily. Take 6 tabs by mouth daily  for 2 days, then 5 tabs for 2 days, then 4 tabs for 2 days, then 3 tabs for 2 days, 2 tabs for 2 days, then 1 tab by mouth daily for 2 days 04/21/23  Yes Trevor Iha, FNP  promethazine-dextromethorphan (PROMETHAZINE-DM) 6.25-15 MG/5ML syrup Take 5 mLs by mouth 2 (two) times daily as needed for cough. 04/21/23  Yes Trevor Iha, FNP  nirmatrelvir/ritonavir (PAXLOVID) 20 x 150 MG & 10 x 100MG  TABS Take 3 tablets by mouth 2 (two) times daily for 5 days. (Take nirmatrelvir 150 mg two tablets twice daily for 5 days and ritonavir 100 mg one tablet twice daily for 5 days) Patient eGFR is 95. Last checked on 12/25/2022. 04/16/23 04/21/23  Alyson Reedy, FNP    Family History Family History  Problem Relation Age of Onset   Diabetes Brother  x 2    Colon cancer Neg Hx    Breast cancer Neg Hx     Social History Social History   Tobacco Use   Smoking status: Never   Smokeless tobacco: Never  Vaping Use   Vaping Use: Never used  Substance Use Topics   Alcohol use: Yes    Comment: occ   Drug use: No     Allergies   Amlodipine and Hydroxychloroquine   Review of Systems Review of Systems  Respiratory:  Positive for cough.   All other systems reviewed and are negative.    Physical Exam Triage Vital Signs ED Triage Vitals  Enc Vitals Group     BP      Pulse      Resp      Temp      Temp src      SpO2      Weight      Height      Head Circumference      Peak Flow      Pain Score      Pain Loc      Pain Edu?      Excl. in GC?    No data found.  Updated Vital Signs BP (!) 154/97 (BP Location: Left Arm)   Pulse (!) 107   Temp 98.4 F (36.9 C) (Oral)   Resp 18   Ht 5' 6.75" (1.695 m)   Wt 246 lb (111.6 kg)   LMP 03/29/2020 (Approximate)   SpO2 96%   BMI 38.82 kg/m      Physical Exam Vitals and nursing note reviewed.  Constitutional:      General: She is not in acute distress.    Appearance: Normal appearance. She is obese.  HENT:     Head: Normocephalic and atraumatic.     Right Ear: Tympanic membrane, ear canal and external ear normal.     Left Ear: Tympanic membrane, ear canal and external ear normal.      Mouth/Throat:     Mouth: Mucous membranes are moist.     Pharynx: Oropharynx is clear.  Eyes:     Extraocular Movements: Extraocular movements intact.     Conjunctiva/sclera: Conjunctivae normal.     Pupils: Pupils are equal, round, and reactive to light.  Cardiovascular:     Rate and Rhythm: Normal rate and regular rhythm.     Pulses: Normal pulses.     Heart sounds: Normal heart sounds.  Pulmonary:     Effort: Pulmonary effort is normal.     Breath sounds: Normal breath sounds. No wheezing, rhonchi or rales.     Comments: Infrequent nonproductive cough noted on exam Musculoskeletal:        General: Normal range of motion.     Cervical back: Normal range of motion and neck supple.  Skin:    General: Skin is warm and dry.  Neurological:     General: No focal deficit present.     Mental Status: She is alert and oriented to person, place, and time. Mental status is at baseline.      UC Treatments / Results  Labs (all labs ordered are listed, but only abnormal results are displayed) Labs Reviewed - No data to display  EKG   Radiology No results found.  Procedures Procedures (including critical care time)  Medications Ordered in UC Medications - No data to display  Initial Impression / Assessment and Plan / UC Course  I have reviewed the triage  vital signs and the nursing notes.  Pertinent labs & imaging results that were available during my care of the patient were reviewed by me and considered in my medical decision making (see chart for details).     MDM: 1.  Cough, unspecified-Rx'd Sterapred Unipak (tapering 60 mg to 10 mg over 10 days), Rx'd Tessalon 200 mg 3 times daily, as needed, Rx'd Promethazine DM 6.25-15 mg / 5 mL-take 5 mL twice daily, as needed for cough. Instructed patient to take medications as directed with food to completion.  Advised may take Tessalon daily or as needed for cough.  Advised may use Promethazine DM at night for cough prior to sleep  due to sedative effects.  Advised not to use cough medications together.  Encouraged increase daily water intake to 64 ounces per day while taking this medication.  Advised if symptoms worsen and/or unresolved please follow-up with PCP or here for further evaluation. Final Clinical Impressions(s) / UC Diagnoses   Final diagnoses:  Cough, unspecified type     Discharge Instructions      Instructed patient to take medications as directed with food to completion.  Advised may take Tessalon daily or as needed for cough.  Advised may use Promethazine DM at night for cough prior to sleep due to sedative effects.  Advised not to use cough medications together.  Encouraged increase daily water intake to 64 ounces per day while taking this medication.  Advised if symptoms worsen and/or unresolved please follow-up with PCP or here for further evaluation.     ED Prescriptions     Medication Sig Dispense Auth. Provider   predniSONE (STERAPRED UNI-PAK 21 TAB) 10 MG (21) TBPK tablet Take by mouth daily. Take 6 tabs by mouth daily  for 2 days, then 5 tabs for 2 days, then 4 tabs for 2 days, then 3 tabs for 2 days, 2 tabs for 2 days, then 1 tab by mouth daily for 2 days 42 tablet Trevor Iha, FNP   benzonatate (TESSALON) 200 MG capsule Take 1 capsule (200 mg total) by mouth 3 (three) times daily as needed for up to 7 days. 40 capsule Trevor Iha, FNP   promethazine-dextromethorphan (PROMETHAZINE-DM) 6.25-15 MG/5ML syrup Take 5 mLs by mouth 2 (two) times daily as needed for cough. 118 mL Trevor Iha, FNP      PDMP not reviewed this encounter.   Trevor Iha, FNP 04/21/23 1624

## 2023-04-24 ENCOUNTER — Other Ambulatory Visit (HOSPITAL_COMMUNITY): Payer: Self-pay

## 2023-04-25 ENCOUNTER — Other Ambulatory Visit (HOSPITAL_COMMUNITY): Payer: Self-pay

## 2023-04-29 ENCOUNTER — Other Ambulatory Visit (HOSPITAL_COMMUNITY): Payer: Self-pay

## 2023-04-29 ENCOUNTER — Encounter: Payer: Self-pay | Admitting: Family Medicine

## 2023-04-29 ENCOUNTER — Ambulatory Visit (INDEPENDENT_AMBULATORY_CARE_PROVIDER_SITE_OTHER): Payer: Commercial Managed Care - PPO | Admitting: Family Medicine

## 2023-04-29 DIAGNOSIS — H66001 Acute suppurative otitis media without spontaneous rupture of ear drum, right ear: Secondary | ICD-10-CM | POA: Diagnosis not present

## 2023-04-29 MED ORDER — SULFAMETHOXAZOLE-TRIMETHOPRIM 800-160 MG PO TABS
1.0000 | ORAL_TABLET | Freq: Two times a day (BID) | ORAL | 1 refills | Status: DC
Start: 2023-04-29 — End: 2023-05-06
  Filled 2023-04-29: qty 14, 7d supply, fill #0
  Filled 2023-05-03: qty 14, 7d supply, fill #1

## 2023-04-29 NOTE — Progress Notes (Signed)
   PROBLEM  VISIT ENCOUNTER NOTE  Subjective:   [name] is a 63 y.o. G28P2002 female here for a problem GYN visit.  Current complaints: right ear pain-- currently covering from COVID 19. Reports decreased hearing on the right. 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     Denies abnormal vaginal bleeding, discharge, pelvic pain, problems with intercourse or other gynecologic concerns.    Gynecologic History Patient's last menstrual period was 03/29/2020 (approximate).  Contraception: status post hysterectomy  Health Maintenance Due  Topic Date Due   Zoster Vaccines- Shingrix (1 of 2) Never done   COVID-19 Vaccine (5 - 2023-24 season) 08/24/2022    The following portions of the patient's history were reviewed and updated as appropriate: allergies, current medications, past family history, past medical history, past social history, past surgical history and problem list.  Review of Systems Pertinent items are noted in HPI.   Objective:  LMP 03/29/2020 (Approximate)  Gen: well appearing, NAD HEENT: no scleral icterus. Right TM is erythematous with bulging behind TM. Left is WNL with white scarring.  CV: RR Lung: Normal WOB Ext: warm well perfused  Assessment and Plan:  1. Non-recurrent acute suppurative otitis media of right ear without spontaneous rupture of tympanic membrane - Exam is diagnostic - Continue other supportive measures - Short course of Septra unlikely to interfere with MXT treatment  - sulfamethoxazole-trimethoprim (BACTRIM DS) 800-160 MG tablet; Take 1 tablet by mouth 2 (two) times daily.  Dispense: 14 tablet; Refill: 1   Please refer to After Visit Summary for other counseling recommendations.   No follow-ups on file.  Federico Flake, MD, MPH, ABFM Attending Physician Faculty Practice- Center for [facility]

## 2023-04-30 ENCOUNTER — Ambulatory Visit (HOSPITAL_COMMUNITY): Payer: Commercial Managed Care - PPO

## 2023-05-03 ENCOUNTER — Other Ambulatory Visit (HOSPITAL_COMMUNITY): Payer: Self-pay

## 2023-05-06 ENCOUNTER — Encounter: Payer: Self-pay | Admitting: Family Medicine

## 2023-05-06 ENCOUNTER — Other Ambulatory Visit: Payer: Self-pay | Admitting: Family Medicine

## 2023-05-06 ENCOUNTER — Other Ambulatory Visit (HOSPITAL_COMMUNITY): Payer: Self-pay

## 2023-05-06 ENCOUNTER — Ambulatory Visit (INDEPENDENT_AMBULATORY_CARE_PROVIDER_SITE_OTHER): Payer: Commercial Managed Care - PPO | Admitting: Family Medicine

## 2023-05-06 DIAGNOSIS — H66001 Acute suppurative otitis media without spontaneous rupture of ear drum, right ear: Secondary | ICD-10-CM

## 2023-05-06 MED ORDER — AMOXICILLIN-POT CLAVULANATE 875-125 MG PO TABS
1.0000 | ORAL_TABLET | Freq: Two times a day (BID) | ORAL | 0 refills | Status: DC
Start: 2023-05-06 — End: 2023-06-07
  Filled 2023-05-06: qty 20, 10d supply, fill #0

## 2023-05-06 NOTE — Progress Notes (Signed)
   PROBLEM  VISIT ENCOUNTER NOTE  Subjective:   [name] is a 62 y.o. G17P2002 female here for a problem GYN visit.  Current complaints: right ear pain-- overall improved but not resolved, continues to have decreased hearing on the right. 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     Denies abnormal vaginal bleeding, discharge, pelvic pain, problems with intercourse or other gynecologic concerns.    Gynecologic History Patient's last menstrual period was 03/29/2020 (approximate).  Contraception: status post hysterectomy  Health Maintenance Due  Topic Date Due   Zoster Vaccines- Shingrix (1 of 2) Never done   COVID-19 Vaccine (5 - 2023-24 season) 08/24/2022    The following portions of the patient's history were reviewed and updated as appropriate: allergies, current medications, past family history, past medical history, past social history, past surgical history and problem list.  Review of Systems Pertinent items are noted in HPI.   Objective:  LMP 03/29/2020 (Approximate)  Gen: well appearing, NAD HEENT: no scleral icterus. Right TM is erythematous, less bulging behind TM. Left is WNL with white scarring.  CV: RR Lung: Normal WOB Ext: warm well perfused  Assessment and Plan:  1. Non-recurrent acute suppurative otitis media of right ear without spontaneous rupture of tympanic membrane - non-resolved AOM - Continue other supportive measures - Will change to Augmentin-- rx sent.    Please refer to After Visit Summary for other counseling recommendations.   No follow-ups on file.  Federico Flake, MD, MPH, ABFM Attending Physician Faculty Practice- Center for [facility]

## 2023-05-07 ENCOUNTER — Other Ambulatory Visit (HOSPITAL_COMMUNITY): Payer: Self-pay

## 2023-05-09 ENCOUNTER — Other Ambulatory Visit (HOSPITAL_COMMUNITY): Payer: Self-pay

## 2023-05-14 ENCOUNTER — Ambulatory Visit: Payer: Commercial Managed Care - PPO | Attending: Family Medicine | Admitting: Pharmacist

## 2023-05-14 DIAGNOSIS — Z79899 Other long term (current) drug therapy: Secondary | ICD-10-CM

## 2023-05-14 NOTE — Progress Notes (Signed)
   S: [facility] of their specialty medication therapy.  Patient is currently taking Humira for rheumatoid arthritis. Patient is managed by Dr. Nickola Major for this.   Adherence: denies any missed doses  Efficacy: reports that the medication is working well for her currently.   Dosing: Rheumatoid arthritis: SubQ: 40 mg every other week (may continue methotrexate, other nonbiologic DMARDS, corticosteroids, NSAIDs, and/or analgesics); patients not taking concomitant methotrexate may increase dose to 40 mg every week  Dose adjustments: Renal: no dose adjustments (has not been studied) Hepatic: no dose adjustments (has not been studied)  Screening: TB test: completed and was negative per patient Hepatitis: completed per patient  Monitoring: S/sx of infection: denies CBC: monitored q3 months and reports it is WNL S/sx of hypersensitivity: denies S/sx of malignancy: denies S/sx of heart failure: denies  O:     Lab Results  Component Value Date   WBC 6.8 12/25/2022   HGB 13.8 12/25/2022   HCT 41.8 12/25/2022   MCV 93 12/25/2022   PLT 300 12/25/2022      Chemistry      Component Value Date/Time   NA 139 12/25/2022 1438   K 4.0 12/25/2022 1438   CL 97 12/25/2022 1438   CO2 28 12/25/2022 1438   BUN 12 12/25/2022 1438   CREATININE 0.73 12/25/2022 1438      Component Value Date/Time   CALCIUM 9.9 12/25/2022 1438   ALKPHOS 56 12/25/2022 1438   AST 30 12/25/2022 1438   ALT 36 (H) 12/25/2022 1438   BILITOT 0.3 12/25/2022 1438       A/P: 1. Medication review: Patient currently on Humira for rheumatoid arthritis and continues to tolerate it well with no adverse effects. Reviewed the medication with the patient, including the following: Humira is a TNF blocking agent indicated for ankylosing spondylitis, Crohn's disease, Hidradenitis suppurativa, psoriatic arthritis, plaque psoriasis, ulcerative colitis, and uveitis. Patient educated on  purpose, proper use and potential adverse effects of Humira. The most common adverse effects are infections, headache, and injection site reactions. 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 There is the possibility of an increased risk of malignancy but it is not well understood if this increased risk is due to there medication or the disease state. There are rare cases of pancytopenia and aplastic anemia. No recommendations for any changes at this time.  Butch Penny, PharmD, P[place]y [place], CPP Clinical Pharmacist [facility] [phone]

## 2023-05-21 ENCOUNTER — Ambulatory Visit (HOSPITAL_COMMUNITY)
Admission: RE | Admit: 2023-05-21 | Discharge: 2023-05-21 | Disposition: A | Discharge status: Home / Self Care | Payer: Commercial Managed Care - PPO | Source: Ambulatory Visit | Attending: Rheumatology | Admitting: Rheumatology

## 2023-05-21 DIAGNOSIS — M0579 Rheumatoid arthritis with rheumatoid factor of multiple sites without organ or systems involvement: Secondary | ICD-10-CM | POA: Insufficient documentation

## 2023-05-24 ENCOUNTER — Other Ambulatory Visit (HOSPITAL_COMMUNITY): Payer: Self-pay

## 2023-05-24 ENCOUNTER — Other Ambulatory Visit: Payer: Self-pay

## 2023-05-24 MED ORDER — FOLIC ACID 1 MG PO TABS
2.0000 mg | ORAL_TABLET | Freq: Every day | ORAL | 3 refills | Status: DC
  Filled 2023-05-24: qty 180, 90d supply, fill #0
  Filled 2023-09-09: qty 180, 90d supply, fill #1
  Filled 2023-12-12: qty 180, 90d supply, fill #2
  Filled 2024-03-13: qty 180, 90d supply, fill #3

## 2023-05-28 ENCOUNTER — Encounter (HOSPITAL_BASED_OUTPATIENT_CLINIC_OR_DEPARTMENT_OTHER): Payer: Self-pay | Admitting: Family Medicine

## 2023-05-30 ENCOUNTER — Other Ambulatory Visit (HOSPITAL_COMMUNITY): Payer: Self-pay

## 2023-06-03 ENCOUNTER — Other Ambulatory Visit (HOSPITAL_COMMUNITY): Payer: Self-pay

## 2023-06-07 ENCOUNTER — Ambulatory Visit (HOSPITAL_BASED_OUTPATIENT_CLINIC_OR_DEPARTMENT_OTHER): Payer: Commercial Managed Care - PPO | Admitting: Family Medicine

## 2023-06-07 VITALS — BP 141/70 | HR 90 | Ht 66.75 in | Wt 246.0 lb

## 2023-06-07 DIAGNOSIS — K760 Fatty (change of) liver, not elsewhere classified: Secondary | ICD-10-CM | POA: Diagnosis not present

## 2023-06-07 DIAGNOSIS — R350 Frequency of micturition: Secondary | ICD-10-CM | POA: Diagnosis not present

## 2023-06-07 DIAGNOSIS — U071 COVID-19: Secondary | ICD-10-CM | POA: Diagnosis not present

## 2023-06-07 DIAGNOSIS — E669 Obesity, unspecified: Secondary | ICD-10-CM | POA: Diagnosis not present

## 2023-06-07 DIAGNOSIS — E785 Hyperlipidemia, unspecified: Secondary | ICD-10-CM | POA: Diagnosis not present

## 2023-06-07 LAB — POCT URINALYSIS DIPSTICK
Bilirubin, UA: NEGATIVE
Blood, UA: NEGATIVE
Glucose, UA: NEGATIVE
Ketones, UA: NEGATIVE
Leukocytes, UA: NEGATIVE
Nitrite, UA: NEGATIVE
Protein, UA: NEGATIVE
Spec Grav, UA: 1.015 (ref 1.010–1.025)
Urobilinogen, UA: 0.2 E.U./dL
pH, UA: 7 (ref 5.0–8.0)

## 2023-06-07 NOTE — Progress Notes (Signed)
    Procedures performed today:    None.  Independent interpretation of notes and tests performed by another provider:   None.  Brief History, Exam, Impression, and Recommendations:    BP (!) 141/70   Pulse 90   Ht 5' 6.75" (1.695 m)   Wt 246 lb (111.6 kg)   LMP 03/29/2020 (Approximate)   SpO2 97%   BMI 38.82 kg/m   COVID-19 Assessment & Plan: Since last appointment with me, patient did get sick with coronavirus.  She was seen in the office as well as emergency department given ongoing symptoms, with notable cough.  She has had slightly prolonged recovery, doing fairly well today On exam, patient is in no acute distress, vital signs stable.  Lungs clear to auscultation bilaterally At this time, recommend continue with gradual progression to normal activities and monitoring symptoms.   Urinary frequency Assessment & Plan: Patient reports recent issues with urinary frequency.  Has not had any pain or burning with urination, no change in appearance or odor of urine.  She has not had any abdominal pain, back pain. On exam, patient is no acute distress, vital signs stable, patient is afebrile. Urine testing in office today was normal which is reassuring, no evidence of blood, white blood cells or bacteria on urine testing.  Given this, would recommend continue to monitor symptoms, will not send off for urine culture given urine testing results  Orders: -     POCT urinalysis dipstick  Obesity (BMI 30-39.9) Assessment & Plan: As below, we did review recent imaging findings which showed evidence of hepatic steatosis.  She is interested in more closely working on lifestyle modifications to work towards gradual weight loss, would like referral to nutritionist to assist with this, referral placed today  Orders: -     Amb Ref to Medical Weight Management  Hepatic steatosis Assessment & Plan: This was incidentally noted on recent CT cardiac imaging.  No specific liver lesions were  commented on, however did indicate diffuse hepatic steatosis.  She has had mild elevation in ALT in the past, this is likely related. We did discuss findings of hepatic steatosis, general recommendations regarding this.  Generally would recommend working towards gradual weight loss.  Discussed dietary considerations, handout provided as well.  She would be interested in referral to nutritionist to assist with lifestyle modifications and working towards gradual weight loss, referral placed   Hyperlipidemia, unspecified hyperlipidemia type Assessment & Plan: Most recent cholesterol panel with elevated total cholesterol and elevated LDL, discussed with patient.  Reviewed general considerations from a lifestyle standpoint, did discuss specific dietary recommendations.  We also reviewed ASCVD risk scoring, current score for patient is 5.6%.  She has also had coronary artery calcium scoring completed.  Score at that time was 12.1 which corresponded to 75th percentile.  Given this, consideration of statin therapy can be given. At this time, patient will more closely focus on lifestyle modifications, dietary adjustments and increasing exercise We will monitor cholesterol panel in the future, plan to complete this fasting in the next few months  Orders: -     Lipid panel; Future  Return in about 3 months (around 09/07/2023) for hyperlipidemia.   ___________________________________________ Ajene Carchi de Peru, MD, ABFM, [facility] Primary Care and Sports Medicine [facility]

## 2023-06-07 NOTE — Patient Instructions (Addendum)
  Medication Instructions:  Your physician recommends that you continue on your current medications as directed. Please refer to the Current Medication list given to you today. --If you need a refill on any your medications before your next appointment, please call your pharmacy first. If no refills are authorized on file call the office.-- Lab Work: Your physician has recommended that you have lab work today: No If you have labs (blood work) drawn today and your tests are completely normal, you will receive your results via MyChart message OR a phone call from our staff.  Please ensure you check your voicemail in the event that you authorized detailed messages to be left on a delegated number. If you have any lab test that is abnormal or we need to change your treatment, we will call you to review the results.  Referrals/Procedures/Imaging: Yes  Follow-Up: Your next appointment:   Your physician recommends that you schedule a follow-up appointment in: 3 months with Dr. de Cuba  You will receive a text message or e-mail with a link to a survey about your care and experience with us today! We would greatly appreciate your feedback!   Thanks for letting us be apart of your health journey!!  Primary Care and Sports Medicine   Dr. Raymond de Cuba   We encourage you to activate your patient portal called "MyChart".  Sign up information is provided on this After Visit Summary.  MyChart is used to connect with patients for Virtual Visits (Telemedicine).  Patients are able to view lab/test results, encounter notes, upcoming appointments, etc.  Non-urgent messages can be sent to your provider as well. To learn more about what you can do with MyChart, please visit --  [email or web address].    

## 2023-06-10 ENCOUNTER — Other Ambulatory Visit (HOSPITAL_COMMUNITY): Payer: Self-pay

## 2023-06-10 ENCOUNTER — Other Ambulatory Visit: Payer: Self-pay

## 2023-06-10 ENCOUNTER — Encounter: Payer: Self-pay | Admitting: Pharmacist

## 2023-06-11 ENCOUNTER — Other Ambulatory Visit: Payer: Self-pay

## 2023-06-11 ENCOUNTER — Other Ambulatory Visit (HOSPITAL_COMMUNITY): Payer: Self-pay

## 2023-07-02 ENCOUNTER — Ambulatory Visit (HOSPITAL_BASED_OUTPATIENT_CLINIC_OR_DEPARTMENT_OTHER): Payer: Commercial Managed Care - PPO | Admitting: Family Medicine

## 2023-07-05 ENCOUNTER — Other Ambulatory Visit (HOSPITAL_COMMUNITY): Payer: Self-pay

## 2023-07-05 ENCOUNTER — Other Ambulatory Visit: Payer: Self-pay

## 2023-07-07 ENCOUNTER — Other Ambulatory Visit (HOSPITAL_COMMUNITY): Payer: Self-pay

## 2023-07-08 ENCOUNTER — Other Ambulatory Visit (HOSPITAL_COMMUNITY): Payer: Self-pay

## 2023-07-09 ENCOUNTER — Other Ambulatory Visit (HOSPITAL_COMMUNITY): Payer: Self-pay

## 2023-07-09 ENCOUNTER — Ambulatory Visit (HOSPITAL_BASED_OUTPATIENT_CLINIC_OR_DEPARTMENT_OTHER): Payer: Commercial Managed Care - PPO | Admitting: Family Medicine

## 2023-07-09 DIAGNOSIS — M0579 Rheumatoid arthritis with rheumatoid factor of multiple sites without organ or systems involvement: Secondary | ICD-10-CM | POA: Diagnosis not present

## 2023-07-09 MED ORDER — METHOTREXATE SODIUM 2.5 MG PO TABS
20.0000 mg | ORAL_TABLET | ORAL | 0 refills | Status: DC
  Filled 2023-07-09: qty 32, 28d supply, fill #0

## 2023-07-10 ENCOUNTER — Other Ambulatory Visit (HOSPITAL_COMMUNITY): Payer: Self-pay

## 2023-07-12 ENCOUNTER — Other Ambulatory Visit (HOSPITAL_COMMUNITY): Payer: Self-pay

## 2023-07-12 MED ORDER — METHOTREXATE SODIUM 2.5 MG PO TABS
20.0000 mg | ORAL_TABLET | ORAL | 0 refills | Status: DC
  Filled 2023-08-02: qty 96, 84d supply, fill #0

## 2023-07-24 ENCOUNTER — Encounter (INDEPENDENT_AMBULATORY_CARE_PROVIDER_SITE_OTHER): Payer: Commercial Managed Care - PPO | Admitting: Family Medicine

## 2023-07-25 ENCOUNTER — Encounter (INDEPENDENT_AMBULATORY_CARE_PROVIDER_SITE_OTHER): Payer: Commercial Managed Care - PPO | Admitting: Family Medicine

## 2023-07-25 NOTE — Assessment & Plan Note (Signed)
As below, we did review recent imaging findings which showed evidence of hepatic steatosis.  She is interested in more closely working on lifestyle modifications to work towards gradual weight loss, would like referral to nutritionist to assist with this, referral placed today

## 2023-07-25 NOTE — Assessment & Plan Note (Signed)
Patient reports recent issues with urinary frequency.  Has not had any pain or burning with urination, no change in appearance or odor of urine.  She has not had any abdominal pain, back pain. On exam, patient is no acute distress, vital signs stable, patient is afebrile. Urine testing in office today was normal which is reassuring, no evidence of blood, white blood cells or bacteria on urine testing.  Given this, would recommend continue to monitor symptoms, will not send off for urine culture given urine testing results

## 2023-07-25 NOTE — Assessment & Plan Note (Signed)
Since last appointment with me, patient did get sick with coronavirus.  She was seen in the office as well as emergency department given ongoing symptoms, with notable cough.  She has had slightly prolonged recovery, doing fairly well today On exam, patient is in no acute distress, vital signs stable.  Lungs clear to auscultation bilaterally At this time, recommend continue with gradual progression to normal activities and monitoring symptoms.

## 2023-07-25 NOTE — Assessment & Plan Note (Signed)
Most recent cholesterol panel with elevated total cholesterol and elevated LDL, discussed with patient.  Reviewed general considerations from a lifestyle standpoint, did discuss specific dietary recommendations.  We also reviewed ASCVD risk scoring, current score for patient is 5.6%.  She has also had coronary artery calcium scoring completed.  Score at that time was 12.1 which corresponded to 75th percentile.  Given this, consideration of statin therapy can be given. At this time, patient will more closely focus on lifestyle modifications, dietary adjustments and increasing exercise We will monitor cholesterol panel in the future, plan to complete this fasting in the next few months

## 2023-07-25 NOTE — Assessment & Plan Note (Signed)
This was incidentally noted on recent CT cardiac imaging.  No specific liver lesions were commented on, however did indicate diffuse hepatic steatosis.  She has had mild elevation in ALT in the past, this is likely related. We did discuss findings of hepatic steatosis, general recommendations regarding this.  Generally would recommend working towards gradual weight loss.  Discussed dietary considerations, handout provided as well.  She would be interested in referral to nutritionist to assist with lifestyle modifications and working towards gradual weight loss, referral placed

## 2023-07-29 ENCOUNTER — Other Ambulatory Visit: Payer: Self-pay

## 2023-07-30 ENCOUNTER — Other Ambulatory Visit: Payer: Self-pay | Admitting: Internal Medicine

## 2023-07-31 ENCOUNTER — Other Ambulatory Visit (HOSPITAL_COMMUNITY): Payer: Self-pay

## 2023-08-02 ENCOUNTER — Other Ambulatory Visit (HOSPITAL_COMMUNITY): Payer: Self-pay

## 2023-08-02 ENCOUNTER — Other Ambulatory Visit: Payer: Self-pay

## 2023-08-02 ENCOUNTER — Other Ambulatory Visit: Payer: Self-pay | Admitting: Pharmacist

## 2023-08-02 ENCOUNTER — Other Ambulatory Visit: Payer: Self-pay | Admitting: Internal Medicine

## 2023-08-02 MED ORDER — HUMIRA (2 PEN) 40 MG/0.4ML ~~LOC~~ AJKT
AUTO-INJECTOR | SUBCUTANEOUS | 4 refills | Status: DC
  Filled 2023-08-02: qty 2, 28d supply, fill #0

## 2023-08-02 MED ORDER — HUMIRA (2 PEN) 40 MG/0.4ML ~~LOC~~ AJKT: AUTO-INJECTOR | SUBCUTANEOUS | 4 refills | Status: DC

## 2023-08-06 ENCOUNTER — Other Ambulatory Visit: Payer: Self-pay

## 2023-08-12 ENCOUNTER — Other Ambulatory Visit (HOSPITAL_COMMUNITY): Payer: Self-pay

## 2023-08-12 ENCOUNTER — Other Ambulatory Visit: Payer: Self-pay

## 2023-08-12 ENCOUNTER — Other Ambulatory Visit: Payer: Self-pay | Admitting: Pharmacist

## 2023-08-12 MED ORDER — HUMIRA (2 PEN) 40 MG/0.4ML ~~LOC~~ AJKT
AUTO-INJECTOR | SUBCUTANEOUS | 4 refills | Status: DC
  Filled 2023-08-12: qty 2, fill #0
  Filled 2023-08-28: qty 2, 28d supply, fill #0
  Filled 2023-09-19: qty 2, 28d supply, fill #1
  Filled 2023-10-22: qty 2, 28d supply, fill #2
  Filled 2023-11-20: qty 2, 28d supply, fill #3
  Filled 2023-12-17: qty 2, 28d supply, fill #4

## 2023-08-22 ENCOUNTER — Other Ambulatory Visit: Payer: Self-pay

## 2023-08-27 ENCOUNTER — Other Ambulatory Visit (HOSPITAL_COMMUNITY): Payer: Self-pay

## 2023-08-28 ENCOUNTER — Other Ambulatory Visit (HOSPITAL_COMMUNITY): Payer: Self-pay

## 2023-08-28 ENCOUNTER — Other Ambulatory Visit: Payer: Self-pay

## 2023-09-02 ENCOUNTER — Other Ambulatory Visit: Payer: Self-pay

## 2023-09-03 ENCOUNTER — Other Ambulatory Visit (HOSPITAL_BASED_OUTPATIENT_CLINIC_OR_DEPARTMENT_OTHER): Payer: Commercial Managed Care - PPO

## 2023-09-03 DIAGNOSIS — E785 Hyperlipidemia, unspecified: Secondary | ICD-10-CM

## 2023-09-04 LAB — LIPID PANEL
Chol/HDL Ratio: 3.9 ratio (ref 0.0–4.4)
Cholesterol, Total: 233 mg/dL — ABNORMAL HIGH (ref 100–199)
HDL: 59 mg/dL (ref >39)
LDL Chol Calc (NIH): 156 mg/dL — ABNORMAL HIGH (ref 0–99)
Triglycerides: 104 mg/dL (ref 0–149)
VLDL Cholesterol Cal: 18 mg/dL (ref 5–40)

## 2023-09-06 ENCOUNTER — Other Ambulatory Visit (HOSPITAL_COMMUNITY): Payer: Self-pay

## 2023-09-09 ENCOUNTER — Other Ambulatory Visit (HOSPITAL_COMMUNITY): Payer: Self-pay

## 2023-09-09 ENCOUNTER — Other Ambulatory Visit (HOSPITAL_BASED_OUTPATIENT_CLINIC_OR_DEPARTMENT_OTHER): Payer: Self-pay | Admitting: Nurse Practitioner

## 2023-09-09 ENCOUNTER — Other Ambulatory Visit: Payer: Self-pay

## 2023-09-09 MED ORDER — CHLORTHALIDONE 25 MG PO TABS
25.0000 mg | ORAL_TABLET | Freq: Every day | ORAL | 2 refills | Status: DC
  Filled 2023-09-09: qty 90, 90d supply, fill #0
  Filled 2023-12-12: qty 90, 90d supply, fill #1
  Filled 2024-03-13: qty 90, 90d supply, fill #2

## 2023-09-10 ENCOUNTER — Encounter (HOSPITAL_BASED_OUTPATIENT_CLINIC_OR_DEPARTMENT_OTHER): Payer: Self-pay | Admitting: Family Medicine

## 2023-09-10 ENCOUNTER — Other Ambulatory Visit (HOSPITAL_COMMUNITY): Payer: Self-pay

## 2023-09-10 ENCOUNTER — Ambulatory Visit (HOSPITAL_BASED_OUTPATIENT_CLINIC_OR_DEPARTMENT_OTHER): Payer: Commercial Managed Care - PPO | Admitting: Family Medicine

## 2023-09-10 ENCOUNTER — Other Ambulatory Visit: Payer: Self-pay

## 2023-09-10 VITALS — BP 131/69 | HR 89 | Ht 67.0 in | Wt 243.0 lb

## 2023-09-10 DIAGNOSIS — Z0000 Encounter for general adult medical examination without abnormal findings: Secondary | ICD-10-CM

## 2023-09-10 DIAGNOSIS — E785 Hyperlipidemia, unspecified: Secondary | ICD-10-CM | POA: Diagnosis not present

## 2023-09-10 DIAGNOSIS — I10 Essential (primary) hypertension: Secondary | ICD-10-CM | POA: Diagnosis not present

## 2023-09-10 MED ORDER — LISINOPRIL 20 MG PO TABS
10.0000 mg | ORAL_TABLET | Freq: Every day | ORAL | 2 refills | Status: DC
  Filled 2023-09-10: qty 45, 90d supply, fill #0
  Filled 2023-12-12: qty 45, 90d supply, fill #1
  Filled 2024-03-13: qty 45, 90d supply, fill #2

## 2023-09-10 NOTE — Assessment & Plan Note (Signed)
Blood pressure at goal in office today.  Patient continues with medication as prescribed including chlorthalidone and lisinopril.  Denies any issues with medication at this time.  No reported headache, chest pain, lightheadedness or dizziness. We will continue with current medication regimen, no changes to be made today.  Recommend intermittent monitoring of blood pressure at home, DASH diet

## 2023-09-10 NOTE — Progress Notes (Signed)
    Procedures performed today:    None.  Independent interpretation of notes and tests performed by another provider:   None.  Brief History, Exam, Impression, and Recommendations:    BP 131/69 (BP Location: Right Arm, Patient Position: Sitting, Cuff Size: Normal)   Pulse 89   Ht 5\' 7"  (1.702 m)   Wt 243 lb (110.2 kg)   LMP 03/29/2020 (Approximate)   SpO2 98%   BMI 38.06 kg/m   Primary hypertension Assessment & Plan: Blood pressure at goal in office today.  Patient continues with medication as prescribed including chlorthalidone and lisinopril.  Denies any issues with medication at this time.  No reported headache, chest pain, lightheadedness or dizziness. We will continue with current medication regimen, no changes to be made today.  Recommend intermittent monitoring of blood pressure at home, DASH diet   Hyperlipidemia, unspecified hyperlipidemia type Assessment & Plan: Most recent cholesterol panel with elevated total cholesterol and elevated LDL, both of these does show improvement from assessment prior to this, reviewed with patient.  Reviewed general considerations from a lifestyle standpoint, did discuss specific dietary recommendations.  We also reviewed ASCVD risk scoring, current score for patient is 5%.  She has also had coronary artery calcium scoring completed previously.  Score was 12.1 which corresponded to 75th percentile.  Given this, consideration of statin therapy can be given. At this time, patient will more closely focus on lifestyle modifications, dietary adjustments and increasing exercise We will monitor cholesterol panel in the future   Wellness examination -     CBC with Differential/Platelet; Future -     Comprehensive metabolic panel; Future -     Hemoglobin A1c; Future -     TSH Rfx on Abnormal to Free T4; Future -     Lipid panel; Future  Other orders -     Lisinopril; Take 0.5 tablet (10 mg total) by mouth daily.  Dispense: 45 tablet; Refill:  2  Return in about 4 months (around 01/10/2024) for CPE with fasting labs 1 week prior.  Spent 32 minutes on this patient encounter, including preparation, chart review, face-to-face counseling with patient and coordination of care, and documentation of encounter   ___________________________________________ Thekla Colborn de [place], MD, ABFM, [facility] Primary Care and Sports Medicine [facility]

## 2023-09-10 NOTE — Assessment & Plan Note (Signed)
Most recent cholesterol panel with elevated total cholesterol and elevated LDL, both of these does show improvement from assessment prior to this, reviewed with patient.  Reviewed general considerations from a lifestyle standpoint, did discuss specific dietary recommendations.  We also reviewed ASCVD risk scoring, current score for patient is 5%.  She has also had coronary artery calcium scoring completed previously.  Score was 12.1 which corresponded to 75th percentile.  Given this, consideration of statin therapy can be given. At this time, patient will more closely focus on lifestyle modifications, dietary adjustments and increasing exercise We will monitor cholesterol panel in the future

## 2023-09-14 ENCOUNTER — Encounter (HOSPITAL_COMMUNITY): Payer: Self-pay

## 2023-09-17 ENCOUNTER — Other Ambulatory Visit (HOSPITAL_COMMUNITY): Payer: Self-pay

## 2023-09-19 ENCOUNTER — Encounter (HOSPITAL_COMMUNITY): Payer: Self-pay

## 2023-09-19 ENCOUNTER — Other Ambulatory Visit (HOSPITAL_COMMUNITY): Payer: Self-pay

## 2023-09-19 ENCOUNTER — Other Ambulatory Visit: Payer: Self-pay

## 2023-09-19 NOTE — Progress Notes (Signed)
Specialty Pharmacy Refill Coordination Note  [name] is a 62 y.o. female contacted today regarding refills of specialty medication(s) Adalimumab .  Patient requested Delivery  on 10/02/23  to verified address [address] [place] [facility] [address]   Medication will be filled on 10/01/23.   Specialty Pharmacy Ongoing Clinical Assessment Note  [name] is a 62 y.o. female who is being followed by the specialty pharmacy service for RxSp Rheumatoid Arthritis   Review of patient's specialty medication(s) Adalimumab  occurred today.   Patient has missed 0  doses in the last 4 weeks.   Patient did not have any additional questions or concerns.   Therapeutic benefit summary: Patient is achieving benefit   Adverse events/side effects summary: No adverse events/side effects   Patient's therapy is appropriate to : Continue    Goals      Minimize recurrence of flares     Patient is on track. Patient will maintain adherence         Follow up:  6 months   Clinical Intervention Notes: Ms. Kneisel recently started taking Pepcid OTC, no DDIs identified.   Clinical Intervetion Outcomes: Prevention of an adverse drug event

## 2023-10-01 ENCOUNTER — Other Ambulatory Visit: Payer: Self-pay

## 2023-10-08 ENCOUNTER — Encounter: Payer: Self-pay | Admitting: Dermatology

## 2023-10-08 ENCOUNTER — Ambulatory Visit: Payer: Commercial Managed Care - PPO | Admitting: Dermatology

## 2023-10-08 VITALS — BP 116/76 | HR 80

## 2023-10-08 DIAGNOSIS — D2330 Other benign neoplasm of skin of unspecified part of face: Secondary | ICD-10-CM

## 2023-10-08 DIAGNOSIS — L573 Poikiloderma of Civatte: Secondary | ICD-10-CM | POA: Diagnosis not present

## 2023-10-08 DIAGNOSIS — W908XXA Exposure to other nonionizing radiation, initial encounter: Secondary | ICD-10-CM | POA: Diagnosis not present

## 2023-10-08 DIAGNOSIS — L578 Other skin changes due to chronic exposure to nonionizing radiation: Secondary | ICD-10-CM | POA: Diagnosis not present

## 2023-10-08 DIAGNOSIS — D239 Other benign neoplasm of skin, unspecified: Secondary | ICD-10-CM | POA: Diagnosis not present

## 2023-10-08 DIAGNOSIS — Z6838 Body mass index (BMI) 38.0-38.9, adult: Secondary | ICD-10-CM | POA: Diagnosis not present

## 2023-10-08 DIAGNOSIS — Z1283 Encounter for screening for malignant neoplasm of skin: Secondary | ICD-10-CM

## 2023-10-08 DIAGNOSIS — L820 Inflamed seborrheic keratosis: Secondary | ICD-10-CM

## 2023-10-08 DIAGNOSIS — M0579 Rheumatoid arthritis with rheumatoid factor of multiple sites without organ or systems involvement: Secondary | ICD-10-CM | POA: Diagnosis not present

## 2023-10-08 DIAGNOSIS — Z79899 Other long term (current) drug therapy: Secondary | ICD-10-CM | POA: Diagnosis not present

## 2023-10-08 DIAGNOSIS — L821 Other seborrheic keratosis: Secondary | ICD-10-CM

## 2023-10-08 DIAGNOSIS — M1991 Primary osteoarthritis, unspecified site: Secondary | ICD-10-CM | POA: Diagnosis not present

## 2023-10-08 DIAGNOSIS — D1801 Hemangioma of skin and subcutaneous tissue: Secondary | ICD-10-CM

## 2023-10-08 DIAGNOSIS — L814 Other melanin hyperpigmentation: Secondary | ICD-10-CM

## 2023-10-08 DIAGNOSIS — L570 Actinic keratosis: Secondary | ICD-10-CM | POA: Diagnosis not present

## 2023-10-08 DIAGNOSIS — E669 Obesity, unspecified: Secondary | ICD-10-CM | POA: Diagnosis not present

## 2023-10-08 DIAGNOSIS — D229 Melanocytic nevi, unspecified: Secondary | ICD-10-CM

## 2023-10-08 NOTE — Progress Notes (Signed)
New Patient Visit   Subjective  [name] is a 62 y.o. female who presents for the following:  Total Body Skin Exam (TBSE)  Patient present today for new patient visit for TBSE.The patient reports she has spots, moles and lesions to be evaluated, some may be new or changing and the patient may have concern these could be cancer. Patient has previously been treated by a dermatologist([facility]) Patient reports her last TBSE was about 1 years ago. .Patient reports she has hx of bx. Patient denies family history of skin cancers. Patient reports throughout her lifetime has had moderate sun exposure. Currently, patient reports if she has excessive sun exposure, she does apply sunscreen and/or wears protective coverings.  She has psoriasis but it is controlled because she is on Humira and Methotrexate for RA.  The following portions of the chart were reviewed this encounter and updated as appropriate: medications, allergies, medical history  Review of Systems:  No other skin or systemic complaints except as noted in HPI or Assessment and Plan.  Objective  Well appearing patient in no apparent distress; mood and affect are within normal limits.  A full examination was performed including scalp, head, eyes, ears, nose, lips, neck, chest, axillae, abdomen, back, buttocks, bilateral upper extremities, bilateral lower extremities, hands, feet, fingers, toes, fingernails, and toenails. All findings within normal limits unless otherwise noted below.   Relevant exam findings are noted in the Assessment and Plan.  Left lower abdomen, left hand, left popliteal, right popliteal (4) Erythematous stuck-on, waxy papule or plaque  Nose Erythematous thin papules/macules with gritty scale.     Assessment & Plan    LENTIGINES, SEBORRHEIC KERATOSES, HEMANGIOMAS - Benign normal skin lesions - Benign-appearing - Call for any changes  MELANOCYTIC NEVI - Tan-brown and/or  pink-flesh-colored symmetric macules and papules - Benign appearing on exam today - Observation - Call clinic for new or changing moles - Recommend daily use of broad spectrum spf 30+ sunscreen to sun-exposed areas.   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  - Staying in the shade or wearing long sleeves, sun glasses (UVA+UVB protection) and wide brim hats (4-inch brim around the entire circumference of the hat) are also recommended for sun protection.  - Call for new or changing lesions.  POIKILODERMA OF CIVATTE Exam: Dilated blood vessels of lat neck  Treatment Plan: Discussed BBL  Syringoma Exam: Flesh colored papules   Treatment Plan: Discussed ED. Advised patient fee is   SKIN CANCER SCREENING PERFORMED TODAY   Inflamed seborrheic keratosis (4) Left lower abdomen, left hand, left popliteal, right popliteal  Symptomatic, irritating, patient would like treated.  Benign-appearing.  Call clinic for new or changing lesions.    Destruction of lesion - Left lower abdomen, left hand, left popliteal, right popliteal (4) Complexity: simple   Destruction method: cryotherapy   Informed consent: discussed and consent obtained   Timeout:  patient name, date of birth, surgical site, and procedure verified Lesion destroyed using liquid nitrogen: Yes   Region frozen until ice ball extended beyond lesion: Yes   Outcome: patient tolerated procedure well with no complications   Post-procedure details: wound care instructions given    AK (actinic keratosis) Nose  Destruction of lesion - Nose Complexity: simple   Destruction method: cryotherapy   Informed consent: discussed and  consent obtained   Timeout:  patient name, date of birth, surgical site, and procedure  verified Lesion destroyed using liquid nitrogen: Yes   Region frozen until ice ball extended beyond lesion: Yes   Outcome: patient tolerated procedure well with no complications   Post-procedure details: wound care instructions given      Return in about 1 year (around 10/07/2024) for TBSE.   Documentation: I have reviewed the above documentation for accuracy and completeness, and I agree with the above.  Langston Reusing, DO

## 2023-10-08 NOTE — Patient Instructions (Addendum)

## 2023-10-15 ENCOUNTER — Other Ambulatory Visit: Payer: Self-pay

## 2023-10-18 ENCOUNTER — Other Ambulatory Visit (HOSPITAL_BASED_OUTPATIENT_CLINIC_OR_DEPARTMENT_OTHER): Payer: Self-pay

## 2023-10-18 DIAGNOSIS — Z79899 Other long term (current) drug therapy: Secondary | ICD-10-CM | POA: Diagnosis not present

## 2023-10-18 DIAGNOSIS — M0579 Rheumatoid arthritis with rheumatoid factor of multiple sites without organ or systems involvement: Secondary | ICD-10-CM | POA: Diagnosis not present

## 2023-10-18 MED ORDER — COVID-19 MRNA VAC-TRIS(PFIZER) 30 MCG/0.3ML IM SUSY
0.3000 mL | PREFILLED_SYRINGE | Freq: Once | INTRAMUSCULAR | 0 refills | Status: AC
  Filled 2023-10-18: qty 0.3, 1d supply, fill #0

## 2023-10-22 ENCOUNTER — Other Ambulatory Visit: Payer: Self-pay

## 2023-10-22 NOTE — Progress Notes (Signed)
Specialty Pharmacy Refill Coordination Note  [name] is a 62 y.o. female contacted today regarding refills of specialty medication(s) Adalimumab   Patient requested Delivery   Delivery date: 10/30/23   Verified address: [address] [place] [address]   Medication will be filled on 10/29/23 for 11/03/23 injection.

## 2023-10-27 ENCOUNTER — Other Ambulatory Visit (HOSPITAL_COMMUNITY): Payer: Self-pay

## 2023-10-28 ENCOUNTER — Other Ambulatory Visit (HOSPITAL_COMMUNITY): Payer: Self-pay

## 2023-10-28 ENCOUNTER — Other Ambulatory Visit: Payer: Self-pay

## 2023-10-28 MED ORDER — METHOTREXATE SODIUM 2.5 MG PO TABS
20.0000 mg | ORAL_TABLET | ORAL | 0 refills | Status: AC
  Filled 2023-10-28: qty 96, 84d supply, fill #0

## 2023-10-29 ENCOUNTER — Other Ambulatory Visit: Payer: Self-pay

## 2023-10-29 NOTE — Progress Notes (Signed)
Reached out to patient & left a voicemail. Medication is requiring a prior authorization. Will notify patient of any further delays.

## 2023-10-29 NOTE — Progress Notes (Signed)
Pharmacy Patient Advocate Encounter   Received notification from Patient Pharmacy that prior authorization for Humira is required/requested.   Insurance verification completed.   The patient is insured through [facility] .   Per test claim: PA required; PA submitted to above mentioned insurance via CoverMyMeds Key/confirmation #/EOC [id number] Status is pending

## 2023-10-29 NOTE — Progress Notes (Signed)
Pharmacy Patient Advocate Encounter  Received notification from [facility] that Prior Authorization for Humira has been APPROVED from 10/28/23 to 10/28/24   PA #/Case ID/Reference #: [id number]

## 2023-10-31 ENCOUNTER — Other Ambulatory Visit: Payer: Self-pay

## 2023-11-14 ENCOUNTER — Encounter (HOSPITAL_BASED_OUTPATIENT_CLINIC_OR_DEPARTMENT_OTHER): Payer: Self-pay | Admitting: Family Medicine

## 2023-11-20 ENCOUNTER — Other Ambulatory Visit: Payer: Self-pay

## 2023-11-20 NOTE — Progress Notes (Signed)
Specialty Pharmacy Refill Coordination Note  [name] is a 62 y.o. female contacted today regarding refills of specialty medication(s) Adalimumab   Patient requested Delivery   Delivery date: 11/28/23   Verified address: [address] [name] [address]   Medication will be filled on 11/27/23 for 12/01/23 injection.

## 2023-11-26 DIAGNOSIS — H5213 Myopia, bilateral: Secondary | ICD-10-CM | POA: Diagnosis not present

## 2023-11-26 DIAGNOSIS — H524 Presbyopia: Secondary | ICD-10-CM | POA: Diagnosis not present

## 2023-11-27 ENCOUNTER — Other Ambulatory Visit: Payer: Self-pay

## 2023-12-12 ENCOUNTER — Other Ambulatory Visit (HOSPITAL_COMMUNITY): Payer: Self-pay

## 2023-12-17 ENCOUNTER — Other Ambulatory Visit: Payer: Self-pay

## 2023-12-17 NOTE — Progress Notes (Signed)
Specialty Pharmacy Refill Coordination Note  [name] is a 62 y.o. female contacted today regarding refills of specialty medication(s) Adalimumab (Humira (2 Pen))   Patient requested Delivery   Delivery date: 12/27/23   Verified address: [address] [place] [address]   Medication will be filled on 12/26/23.

## 2023-12-24 ENCOUNTER — Other Ambulatory Visit (HOSPITAL_BASED_OUTPATIENT_CLINIC_OR_DEPARTMENT_OTHER): Payer: Self-pay

## 2023-12-24 ENCOUNTER — Ambulatory Visit (HOSPITAL_BASED_OUTPATIENT_CLINIC_OR_DEPARTMENT_OTHER): Payer: Commercial Managed Care - PPO | Admitting: Family Medicine

## 2023-12-24 VITALS — BP 155/75 | HR 87 | Temp 98.1°F | Ht 67.0 in | Wt 240.0 lb

## 2023-12-24 DIAGNOSIS — H66001 Acute suppurative otitis media without spontaneous rupture of ear drum, right ear: Secondary | ICD-10-CM

## 2023-12-24 MED ORDER — AMOXICILLIN-POT CLAVULANATE 875-125 MG PO TABS
1.0000 | ORAL_TABLET | Freq: Two times a day (BID) | ORAL | 0 refills | Status: DC
Start: 2023-12-24 — End: 2024-02-04
  Filled 2023-12-24: qty 20, 10d supply, fill #0

## 2023-12-24 NOTE — Progress Notes (Signed)
   Acute Office Visit  Subjective:     Patient ID: [name], female    DOB: [birth year], 62 y.o.   MRN: [id number]  Chief Complaint  Patient presents with   Ear Fullness    Bilateral ear pain, cannot hear, believes she has an ear infection   Nasal Congestion    Ongoing since 12/22 , has tried mucinex and mucinex DM. Head and sinus pressure   Sore Throat   [name] is a pleasant 62 year old female patient who presents today for an acute visit with complaints of ear fullness, nasal congestion, and sore throat. Symptoms have been present: started about a week ago  Associated symptoms include: fatigue, chills no fever, nasal congestion, ear pain, rhinorrhea, sputum production, headache  Pain severity: intermittent ear pain with muffled sounds  Treatments tried include: Mucinex  Sick contacts: other family members had respiratory illnesses after Christmas   ROS: See HPI     Objective:    BP (!) 155/75 (Cuff Size: Normal)   Pulse 87   Ht 5' 7 (1.702 m)   Wt 240 lb (108.9 kg)   LMP 03/29/2020 (Approximate)   SpO2 97%   BMI 37.59 kg/m   Physical Exam HENT:     Right Ear: Ear canal and external ear normal. Decreased hearing (muffled) noted. Tympanic membrane is erythematous and bulging.     Left Ear: Ear canal and external ear normal. Decreased hearing (chronic) noted. Tympanic membrane is scarred.       Assessment & Plan:   1. Right acute suppurative otitis media (Primary) [name] is a pleasant 62 year old female patient who presents today for an acute visit with complaints of ear fullness, nasal congestion, and sore throat x7 days. 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 Physical exam with right TM intact, opaque TM with decrease in light reflection, erythema and bulging. No tenderness or swelling of outer ear. L TM  translucent, pearly grey, and shiny with no  bulging or retraction. Normal in appearance with good cone-shaped light reflection of light and smooth consistency. Landmarks are clearly visible. No frontal or maxillary sinus tenderness present. Lung sounds clear to auscultation. Will treat with Augmentin . Advised patient to reach out if she continues to have symptoms despite completing the full course of antibiotics.   - amoxicillin -clavulanate (AUGMENTIN ) 875-125 MG tablet; Take 1 tablet by mouth 2 (two) times daily.  Dispense: 20 tablet; Refill: 0   Return if symptoms worsen or fail to improve.  Evalene Arts, FNP

## 2023-12-24 NOTE — Patient Instructions (Signed)
 Vitamin Regimen:  Vitamin C 500mg  twice daily  Vitamin D 5000 units once daily  Zinc 50-75mg  once daily   Over the counter Medications:  Aspirin 81mg  per day * unless allergic or contraindicated*  Use Tylenol  (acetaminophen ) for fever or Advil  (ibuprofen ) *unless allergic or contraindicated*    Non-Medication Therapy:  Drink plenty of fluids, warm if possible.  A teaspoon of honey may help ease coughing symptoms.  Cough drops or hard candy for coughing.   Over the Counter Medication Therapy:  Use a cough expectorant such as guaifenesin (Mucinex) if recommended by your doctor for a wet, congested cough. If you have high blood pressure, please ask your doctor first before using this.   Use a cough suppressant such as dextromethorphan (Robitussin/Delsym) for a dry cough. If you have high blood pressure, please ask your doctor first before using this.   If you have high blood pressure, medication such as Coricidin HBP is safe to take for your cough and will not increase your blood pressure.

## 2023-12-26 ENCOUNTER — Other Ambulatory Visit: Payer: Self-pay

## 2024-01-07 ENCOUNTER — Other Ambulatory Visit (HOSPITAL_BASED_OUTPATIENT_CLINIC_OR_DEPARTMENT_OTHER): Payer: Self-pay | Admitting: Family Medicine

## 2024-01-07 DIAGNOSIS — Z0000 Encounter for general adult medical examination without abnormal findings: Secondary | ICD-10-CM | POA: Diagnosis not present

## 2024-01-08 LAB — CBC WITH DIFFERENTIAL/PLATELET
Basophils Absolute: 0 10*3/uL (ref 0.0–0.2)
Basos: 1 %
EOS (ABSOLUTE): 0.2 10*3/uL (ref 0.0–0.4)
Eos: 3 %
Hematocrit: 42.5 % (ref 34.0–46.6)
Hemoglobin: 13.8 g/dL (ref 11.1–15.9)
Immature Grans (Abs): 0 10*3/uL (ref 0.0–0.1)
Immature Granulocytes: 0 %
Lymphocytes Absolute: 2.9 10*3/uL (ref 0.7–3.1)
Lymphs: 53 %
MCH: 30.5 pg (ref 26.6–33.0)
MCHC: 32.5 g/dL (ref 31.5–35.7)
MCV: 94 fL (ref 79–97)
Monocytes Absolute: 0.5 10*3/uL (ref 0.1–0.9)
Monocytes: 9 %
Neutrophils Absolute: 1.8 10*3/uL (ref 1.4–7.0)
Neutrophils: 34 %
Platelets: 272 10*3/uL (ref 150–450)
RBC: 4.52 x10E6/uL (ref 3.77–5.28)
RDW: 13.3 % (ref 11.7–15.4)
WBC: 5.4 10*3/uL (ref 3.4–10.8)

## 2024-01-08 LAB — LIPID PANEL
Chol/HDL Ratio: 4 {ratio} (ref 0.0–4.4)
Cholesterol, Total: 231 mg/dL — ABNORMAL HIGH (ref 100–199)
HDL: 58 mg/dL (ref >39)
LDL Chol Calc (NIH): 157 mg/dL — ABNORMAL HIGH (ref 0–99)
Triglycerides: 92 mg/dL (ref 0–149)
VLDL Cholesterol Cal: 16 mg/dL (ref 5–40)

## 2024-01-08 LAB — COMPREHENSIVE METABOLIC PANEL
ALT: 26 [IU]/L (ref 0–32)
AST: 24 [IU]/L (ref 0–40)
Albumin: 4.3 g/dL (ref 3.8–4.9)
Alkaline Phosphatase: 51 [IU]/L (ref 44–121)
BUN/Creatinine Ratio: 19 (ref 12–28)
BUN: 14 mg/dL (ref 8–27)
Bilirubin Total: 0.4 mg/dL (ref 0.0–1.2)
CO2: 26 mmol/L (ref 20–29)
Calcium: 9.5 mg/dL (ref 8.7–10.3)
Chloride: 99 mmol/L (ref 96–106)
Creatinine, Ser: 0.73 mg/dL (ref 0.57–1.00)
Globulin, Total: 2.2 g/dL (ref 1.5–4.5)
Glucose: 93 mg/dL (ref 70–99)
Potassium: 4 mmol/L (ref 3.5–5.2)
Sodium: 141 mmol/L (ref 134–144)
Total Protein: 6.5 g/dL (ref 6.0–8.5)
eGFR: 94 mL/min/{1.73_m2} (ref >59)

## 2024-01-08 LAB — HEMOGLOBIN A1C
Est. average glucose Bld gHb Est-mCnc: 120 mg/dL
Hgb A1c MFr Bld: 5.8 % — ABNORMAL HIGH (ref 4.8–5.6)

## 2024-01-08 LAB — TSH RFX ON ABNORMAL TO FREE T4: TSH: 1.1 u[IU]/mL (ref 0.450–4.500)

## 2024-01-09 ENCOUNTER — Encounter (HOSPITAL_BASED_OUTPATIENT_CLINIC_OR_DEPARTMENT_OTHER): Payer: Self-pay

## 2024-01-09 ENCOUNTER — Other Ambulatory Visit (HOSPITAL_BASED_OUTPATIENT_CLINIC_OR_DEPARTMENT_OTHER): Payer: Commercial Managed Care - PPO

## 2024-01-13 ENCOUNTER — Other Ambulatory Visit: Payer: Self-pay

## 2024-01-14 ENCOUNTER — Other Ambulatory Visit: Payer: Self-pay

## 2024-01-14 ENCOUNTER — Other Ambulatory Visit: Payer: Self-pay | Admitting: Family Medicine

## 2024-01-14 ENCOUNTER — Encounter (HOSPITAL_BASED_OUTPATIENT_CLINIC_OR_DEPARTMENT_OTHER): Payer: Commercial Managed Care - PPO | Admitting: Family Medicine

## 2024-01-14 NOTE — Progress Notes (Signed)
Specialty Pharmacy Refill Coordination Note  [name] is a 63 y.o. female contacted today regarding refills of specialty medication(s) Adalimumab (Humira (2 Pen))   Patient requested Delivery   Delivery date: 02/05/24   Verified address: [address]   [place] [address]   Medication will be filled on 02/04/24 pending a refill request.

## 2024-01-14 NOTE — Telephone Encounter (Signed)
Last refill: 08/12/23 2 each 4RF  Notes to clinic: non delegated Rx, not CHW patient  Requested Prescriptions  Pending Prescriptions Disp Refills   adalimumab (HUMIRA, 2 PEN,) 40 MG/0.4ML pen 2 each 4    Sig: INJECT 40 MG Subcutaneous EVERY OTHER WEEK 30 days     Not Delegated - Immunology: Tumor Necrosis Factor Blockers - adalimumab & golimumab Failed - 01/14/2024  1:37 PM      Failed - This refill cannot be delegated      Failed - Valid encounter within last 6 months    Recent Outpatient Visits           8 months ago Encounter for medication review   [facility] [place]. [facility] Drucilla Chalet, RPH-CPP   1 year ago Encounter for medication review   [facility] Of [place]. [facility] Drucilla Chalet, RPH-CPP   2 years ago Encounter for medication review   [facility] [place] [facility] Dept Of [place]. [facility] Drucilla Chalet, RPH-CPP   3 years ago Encounter for medication review   [facility] [place] [facility] Dept Of . [facility] Drucilla Chalet, RPH-CPP       Future Appointments             In 3 weeks de [place], Buren Kos, MD [facility] Primary Care & Sports Medicine at [facility], [place]            Passed - ALT in normal range and within 180 days    ALT  Date Value Ref Range Status  01/07/2024 26 0 - 32 IU/L Final         Passed - AST in normal range and within 180 days    AST  Date Value Ref Range Status  01/07/2024 24 0 - 40 IU/L Final         Passed - HCT in normal range and within 180 days    Hematocrit  Date Value Ref Range Status  01/07/2024 42.5 34.0 - 46.6 % Final         Passed - HGB in normal range and within 180 days    Hemoglobin  Date Value Ref Range Status  01/07/2024 13.8 11.1 - 15.9 g/dL Final         Passed - PLT in normal range and within 180 days    Platelets  Date  Value Ref Range Status  01/07/2024 272 150 - 450 x10E3/uL Final         Passed - WBC in normal range and within 180 days    WBC  Date Value Ref Range Status  01/07/2024 5.4 3.4 - 10.8 x10E3/uL Final  08/24/2021 11.6 (H) 4.0 - 10.5 K/uL Final            Requested Prescriptions  Pending Prescriptions Disp Refills   adalimumab (HUMIRA, 2 PEN,) 40 MG/0.4ML pen 2 each 4    Sig: INJECT 40 MG Subcutaneous EVERY OTHER WEEK 30 days     Not Delegated - Immunology: Tumor Necrosis Factor Blockers - adalimumab & golimumab Failed - 01/14/2024  1:37 PM      Failed - This refill cannot be delegated      Failed - Valid encounter within last 6 months    Recent Outpatient Visits  8 months ago Encounter for medication review   [facility] Of [place]. [facility] Drucilla Chalet, RPH-CPP   1 year ago Encounter for medication review   [place] [facility] [place] [facility] Of [place]. [facility] Drucilla Chalet, RPH-CPP   2 years ago Encounter for medication review   [facility] [place] [facility] Dept Of [place]. [facility] Drucilla Chalet, RPH-CPP   3 years ago Encounter for medication review   [place] [facility] [place] [facility] Dept Of [place]. [facility] Drucilla Chalet, RPH-CPP       Future Appointments             In 3 weeks de [place], Buren Kos, MD [facility] Primary Care & Sports Medicine at [facility], [place]            Passed - ALT in normal range and within 180 days    ALT  Date Value Ref Range Status  01/07/2024 26 0 - 32 IU/L Final         Passed - AST in normal range and within 180 days    AST  Date Value Ref Range Status  01/07/2024 24 0 - 40 IU/L Final         Passed - HCT in normal range and within 180 days    Hematocrit  Date Value Ref Range Status  01/07/2024 42.5 34.0 - 46.6 % Final         Passed - HGB in normal range and within  180 days    Hemoglobin  Date Value Ref Range Status  01/07/2024 13.8 11.1 - 15.9 g/dL Final         Passed - PLT in normal range and within 180 days    Platelets  Date Value Ref Range Status  01/07/2024 272 150 - 450 x10E3/uL Final         Passed - WBC in normal range and within 180 days    WBC  Date Value Ref Range Status  01/07/2024 5.4 3.4 - 10.8 x10E3/uL Final  08/24/2021 11.6 (H) 4.0 - 10.5 K/uL Final

## 2024-01-15 ENCOUNTER — Other Ambulatory Visit (HOSPITAL_COMMUNITY): Payer: Self-pay

## 2024-01-15 ENCOUNTER — Other Ambulatory Visit: Payer: Self-pay

## 2024-01-15 MED ORDER — HUMIRA (2 PEN) 40 MG/0.4ML ~~LOC~~ AJKT: AUTO-INJECTOR | SUBCUTANEOUS | 3 refills | Status: DC

## 2024-01-16 ENCOUNTER — Other Ambulatory Visit: Payer: Self-pay

## 2024-01-16 ENCOUNTER — Other Ambulatory Visit: Payer: Self-pay | Admitting: Pharmacist

## 2024-01-16 MED ORDER — HUMIRA (2 PEN) 40 MG/0.4ML ~~LOC~~ AJKT
AUTO-INJECTOR | SUBCUTANEOUS | 3 refills | Status: DC
  Filled 2024-01-16: qty 2, 28d supply, fill #0
  Filled 2024-03-04: qty 2, 28d supply, fill #1
  Filled 2024-04-07 (×2): qty 2, 28d supply, fill #2
  Filled 2024-05-07: qty 2, 28d supply, fill #3

## 2024-01-26 ENCOUNTER — Other Ambulatory Visit (HOSPITAL_COMMUNITY): Payer: Self-pay

## 2024-01-27 ENCOUNTER — Other Ambulatory Visit: Payer: Self-pay

## 2024-01-29 DIAGNOSIS — M0579 Rheumatoid arthritis with rheumatoid factor of multiple sites without organ or systems involvement: Secondary | ICD-10-CM | POA: Diagnosis not present

## 2024-02-03 ENCOUNTER — Other Ambulatory Visit: Payer: Self-pay | Admitting: Obstetrics & Gynecology

## 2024-02-03 ENCOUNTER — Other Ambulatory Visit (HOSPITAL_BASED_OUTPATIENT_CLINIC_OR_DEPARTMENT_OTHER): Payer: Self-pay

## 2024-02-03 ENCOUNTER — Other Ambulatory Visit: Payer: Self-pay

## 2024-02-03 ENCOUNTER — Other Ambulatory Visit (HOSPITAL_COMMUNITY): Payer: Self-pay

## 2024-02-03 ENCOUNTER — Encounter (HOSPITAL_COMMUNITY): Payer: Self-pay | Admitting: Pharmacist

## 2024-02-03 MED ORDER — FLUCONAZOLE 150 MG PO TABS
150.0000 mg | ORAL_TABLET | ORAL | 1 refills | Status: AC
  Filled 2024-02-03 (×2): qty 12, 84d supply, fill #0

## 2024-02-03 MED ORDER — METHOTREXATE SODIUM 2.5 MG PO TABS
20.0000 mg | ORAL_TABLET | ORAL | 0 refills | Status: DC
  Filled 2024-02-03 (×2): qty 96, 84d supply, fill #0

## 2024-02-03 NOTE — Progress Notes (Signed)
 Diflucan  refilled as per patient's request.   Celeste Candelas, MD

## 2024-02-04 ENCOUNTER — Other Ambulatory Visit (HOSPITAL_COMMUNITY): Payer: Self-pay

## 2024-02-04 ENCOUNTER — Encounter (HOSPITAL_BASED_OUTPATIENT_CLINIC_OR_DEPARTMENT_OTHER): Payer: Self-pay | Admitting: Family Medicine

## 2024-02-04 ENCOUNTER — Ambulatory Visit (INDEPENDENT_AMBULATORY_CARE_PROVIDER_SITE_OTHER): Payer: Commercial Managed Care - PPO | Admitting: Family Medicine

## 2024-02-04 ENCOUNTER — Other Ambulatory Visit: Payer: Self-pay

## 2024-02-04 VITALS — BP 127/60 | HR 86 | Ht 66.0 in | Wt 237.3 lb

## 2024-02-04 DIAGNOSIS — Z0000 Encounter for general adult medical examination without abnormal findings: Secondary | ICD-10-CM

## 2024-02-04 DIAGNOSIS — I10 Essential (primary) hypertension: Secondary | ICD-10-CM

## 2024-02-04 DIAGNOSIS — E785 Hyperlipidemia, unspecified: Secondary | ICD-10-CM

## 2024-02-04 MED ORDER — ATORVASTATIN CALCIUM 10 MG PO TABS
10.0000 mg | ORAL_TABLET | Freq: Every day | ORAL | 1 refills | Status: DC
  Filled 2024-02-04: qty 90, 90d supply, fill #0

## 2024-02-04 NOTE — Patient Instructions (Signed)
  Medication Instructions:  Your physician recommends that you continue on your current medications as directed. Please refer to the Current Medication list given to you today. --If you need a refill on any your medications before your next appointment, please call your pharmacy first. If no refills are authorized on file call the office.-- Lab Work: Your physician has recommended that you have lab work today: 1 week prior  If you have labs (blood work) drawn today and your tests are completely normal, you will receive your results via MyChart message OR a phone call from our staff.  Please ensure you check your voicemail in the event that you authorized detailed messages to be left on a delegated number. If you have any lab test that is abnormal or we need to change your treatment, we will call you to review the results.    Follow-Up: Your next appointment:   Your physician recommends that you schedule a follow-up appointment in: 3-4 follow up  with Dr. de [place]  You will receive a text message or e-mail with a link to a survey about your care and experience with [place] today! We would greatly appreciate your feedback!   Thanks for letting us be apart of your health journey!!  Primary Care and Sports Medicine   Dr. Ceasar Mons [place]   We encourage you to activate your patient portal called "MyChart".  Sign up information is provided on this After Visit Summary.  MyChart is used to connect with patients for Virtual Visits (Telemedicine).  Patients are able to view lab/test results, encounter notes, upcoming appointments, etc.  Non-urgent messages can be sent to your provider as well. To learn more about what you can do with MyChart, please visit --  [email or web address].

## 2024-02-04 NOTE — Assessment & Plan Note (Signed)
Discussed options today, reviewed recent labs, prior CAC scoring. After discussion, elected to proceed with statin therapy, cautioned on side effects. Plan to follow-up in 3 months with lipid panel 1 week prior

## 2024-02-04 NOTE — Assessment & Plan Note (Signed)
Routine HCM labs reviewed. HCM reviewed/discussed. Anticipatory guidance regarding healthy weight, lifestyle and choices given. Recommend healthy diet.  Recommend approximately 150 minutes/week of moderate intensity exercise Recommend regular dental and vision exams Always use seatbelt/lap and shoulder restraints Recommend using smoke alarms and checking batteries at least twice a year Recommend using sunscreen when outside Discussed colon cancer screening recommendations, options.  Patient is UTD Discussed recommendations for shingles vaccine.  Patient cannot receive due to medical reason Discussed immunization recommendations, she will check on last tetanus received

## 2024-02-04 NOTE — Progress Notes (Signed)
Subjective:    CC: Annual Physical Exam  HPI: [name] is a 63 y.o. presenting for annual physical  I reviewed the past medical history, family history, social history, surgical history, and allergies today and no changes were needed.  Please see the problem list section below in epic for further details.  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 08/23/2021   08/23/21 Surgical pathology UTERUS, CERVIX, BILATERAL FALLOPIAN TUBES, HYSTERECTOMY AND SALPINGECTOMY:  - Foci of complex endometrial hyperplasia with focal atypia  - No evidence of invasive carcinoma  - Benign unremarkable cervix  - Benign unremarkable bilateral fallopian tubes   GERD (gastroesophageal reflux disease)    Helicobacter pylori antibody positive 2006   Tx w/ PPI, amoxicillin and Biaxin   Hemorrhoids    History of gestational diabetes    yrs ago with 1st pregnancy   Hyperlipidemia    Hypertension    Obesity    PMB (postmenopausal bleeding) 08/15/2021   Pneumonia    as child   Postmenopausal bleeding 06/23/2021   Ultrasound showed 8.6 mm EM, 3 cm fibroid   Psoriasis 08/15/2021   PVC's (premature ventricular contractions)    occasional R/O cardiac via [place] yrs ago   Rheumatoid arthritis (HCC)    Wears glasses    or contacts   Past Surgical History: Past Surgical History:  Procedure Laterality Date   CERVICAL POLYPECTOMY  12/22/2012   Procedure: CERVICAL POLYPECTOMY;  Surgeon: Geryl Rankins, MD;  Location: [facility];  Service:  Gynecology;  Laterality: N/A;   CESAREAN SECTION     x 2 1999 and 2001   COLONOSCOPY     colonscopy and endoscopy  08/05/2020   HYSTEROSCOPY WITH D & C  12/22/2012   Procedure: DILATATION AND CURETTAGE /HYSTEROSCOPY;  Surgeon: Geryl Rankins, MD;  Location: [facility];  Service: Gynecology;;   LAPAROSCOPIC VAGINAL HYSTERECTOMY WITH SALPINGECTOMY Bilateral 08/23/2021   Procedure: LAPAROSCOPIC ASSISTED VAGINAL HYSTERECTOMY WITH BILATERAL SALPINGECTOMY;  Surgeon: Tereso Newcomer, MD;  Location: [facility];  Service: Gynecology;  Laterality: Bilateral;   PLANTAR'S WART EXCISION     8 yrs ago per pt on 08-15-2021   Social History: Social History   Socioeconomic History   Marital status: Married    Spouse name: Not on file   Number of children: 2   Years of education: Not on file   Highest education level: Bachelor's degree (e.g., BA, AB, BS)  Occupational History   Occupation: MSN    Employer:   Tobacco Use   Smoking status: Never    Passive exposure: Never   Smokeless tobacco: Never  Vaping Use   Vaping status: Never Used  Substance and Sexual Activity   Alcohol use: Yes    Comment: occ   Drug use: No   Sexual activity: Yes    Birth control/protection: Post-menopausal  Other Topics Concern   Not on file  Social History Narrative   Daily caffeine    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 [employer] Degree  Husband is a PA with [employer]   1 son and 1 daughter born 46 and 2001   Social Drivers of Corporate investment banker Strain: Low Risk  (12/24/2023)   Overall Financial Resource Strain (CARDIA)    Difficulty of Paying Living Expenses: Not hard at all  Food Insecurity: No Food Insecurity (12/24/2023)   Hunger Vital Sign    Worried About Running Out of Food in the Last Year: Never true    Ran Out of Food in the Last Year: Never true  Transportation Needs: No Transportation Needs (12/24/2023)   PRAPARE - Doctor, general practice (Medical): No    Lack of Transportation (Non-Medical): No  Physical Activity: Insufficiently Active (12/24/2023)   Exercise Vital Sign    Days of Exercise per Week: 1 day    Minutes of Exercise per Session: 20 min  Stress: No Stress Concern Present (12/24/2023)   [employer] of Occupational Health - Occupational Stress Questionnaire    Feeling of Stress : Not at all  Social Connections: Socially Integrated (12/24/2023)   Social Connection and Isolation Panel [NHANES]    Frequency of Communication with Friends and Family: More than three times a week    Frequency of Social Gatherings with Friends and Family: Once a week    Attends Religious Services: More than 4 times per year    Active Member of [employer] or Organizations: Yes    Attends Engineer, structural: More than 4 times per year    Marital Status: Married   Family History: Family History  Problem Relation Age of Onset   Diabetes Brother        x 2    Colon cancer Neg Hx    Breast cancer Neg Hx    Allergies: Allergies  Allergen Reactions   Amlodipine Swelling and Other (See Comments)    petichane   Hydroxychloroquine Nausea And Vomiting   Medications: See med rec.  Review of Systems: No headache, visual changes, nausea, vomiting, diarrhea, constipation, dizziness, abdominal pain, skin rash, fevers, chills, night sweats, swollen lymph nodes, weight loss, chest pain, body aches, joint swelling, muscle aches, shortness of breath, mood changes, visual or auditory hallucinations.  Objective:    BP 127/60 (BP Location: Left Arm, Patient Position: Sitting, Cuff Size: Large)   Pulse 86   Ht 5\' 6"  (1.676 m)   Wt 237 lb 4.8 oz (107.6 kg)   LMP 03/29/2020 (Approximate)   SpO2 97%   BMI 38.30 kg/m   General: Well Developed, well nourished, and in no acute distress.  Neuro: Alert and oriented x3, extra-ocular muscles intact, sensation grossly intact. Cranial nerves II through XII are intact,  motor, sensory, and coordinative functions are all intact. HEENT: Normocephalic, atraumatic, pupils equal round reactive to light, neck supple, no masses, no lymphadenopathy, thyroid nonpalpable. Oropharynx, nasopharynx, external ear canals are unremarkable. Skin: Warm and dry, no rashes noted.  Cardiac: Regular rate and rhythm, no murmurs rubs or gallops.  Respiratory: Clear to auscultation bilaterally. Not using accessory muscles, speaking in full sentences. Abdominal: Soft, nontender, nondistended, positive bowel sounds, no masses, no organomegaly. Musculoskeletal: Shoulder, elbow, wrist, hip, knee, ankle stable, and with full range of motion.  Impression and Recommendations:    Wellness examination Assessment & Plan: Routine HCM labs reviewed. HCM reviewed/discussed. Anticipatory guidance regarding healthy weight, lifestyle and choices given. Recommend healthy diet.  Recommend approximately 150 minutes/week of moderate intensity exercise Recommend regular dental and vision exams Always use seatbelt/lap  and shoulder restraints Recommend using smoke alarms and checking batteries at least twice a year Recommend using sunscreen when outside Discussed colon cancer screening recommendations, options.  Patient is UTD Discussed recommendations for shingles vaccine.  Patient cannot receive due to medical reason Discussed immunization recommendations, she will check on last tetanus received   Hyperlipidemia, unspecified hyperlipidemia type Assessment & Plan: Discussed options today, reviewed recent labs, prior CAC scoring. After discussion, elected to proceed with statin therapy, cautioned on side effects. Plan to follow-up in 3 months with lipid panel 1 week prior  Orders: -     Lipid panel; Future  Primary hypertension -     EKG 12-Lead  Other orders -     Atorvastatin Calcium; Take 1 tablet (10 mg total) by mouth daily.  Dispense: 90 tablet; Refill: 1  Return in about 3 months  (around 05/03/2024) for hypertension, hyperlipidemia.   ___________________________________________ Masiya Claassen de [place], MD, ABFM, [facility] Primary Care and Sports Medicine [facility]

## 2024-02-16 ENCOUNTER — Other Ambulatory Visit (HOSPITAL_COMMUNITY): Payer: Self-pay

## 2024-02-17 ENCOUNTER — Other Ambulatory Visit (HOSPITAL_COMMUNITY): Payer: Self-pay

## 2024-02-17 ENCOUNTER — Other Ambulatory Visit: Payer: Self-pay

## 2024-02-17 MED ORDER — MELOXICAM 15 MG PO TABS
15.0000 mg | ORAL_TABLET | Freq: Every day | ORAL | 1 refills | Status: DC
  Filled 2024-02-17: qty 90, 90d supply, fill #0
  Filled 2024-07-17: qty 90, 90d supply, fill #1

## 2024-03-04 ENCOUNTER — Other Ambulatory Visit: Payer: Self-pay

## 2024-03-04 NOTE — Progress Notes (Signed)
 Specialty Pharmacy Refill Coordination Note  [name] is a 63 y.o. female contacted today regarding refills of specialty medication(s) Adalimumab (Humira (2 Pen))   Patient requested (Patient-Rptd) Delivery   Delivery date: (Patient-Rptd) 03/10/24   Verified address: (Patient-Rptd) [address] [place] [address]   Medication will be filled on 03.17.25.

## 2024-03-09 ENCOUNTER — Other Ambulatory Visit: Payer: Self-pay

## 2024-03-12 ENCOUNTER — Other Ambulatory Visit: Payer: Self-pay

## 2024-03-13 ENCOUNTER — Other Ambulatory Visit (HOSPITAL_COMMUNITY): Payer: Self-pay

## 2024-03-13 ENCOUNTER — Other Ambulatory Visit: Payer: Self-pay

## 2024-03-24 ENCOUNTER — Other Ambulatory Visit (HOSPITAL_BASED_OUTPATIENT_CLINIC_OR_DEPARTMENT_OTHER): Payer: Self-pay | Admitting: Obstetrics & Gynecology

## 2024-03-24 DIAGNOSIS — Z1231 Encounter for screening mammogram for malignant neoplasm of breast: Secondary | ICD-10-CM

## 2024-04-02 ENCOUNTER — Other Ambulatory Visit: Payer: Self-pay

## 2024-04-03 ENCOUNTER — Other Ambulatory Visit: Payer: Self-pay

## 2024-04-07 ENCOUNTER — Ambulatory Visit (HOSPITAL_BASED_OUTPATIENT_CLINIC_OR_DEPARTMENT_OTHER)
Admission: RE | Admit: 2024-04-07 | Discharge: 2024-04-07 | Disposition: A | Discharge status: Home / Self Care | Source: Ambulatory Visit | Attending: Family Medicine | Admitting: Family Medicine

## 2024-04-07 ENCOUNTER — Other Ambulatory Visit: Payer: Self-pay

## 2024-04-07 ENCOUNTER — Encounter (HOSPITAL_BASED_OUTPATIENT_CLINIC_OR_DEPARTMENT_OTHER): Payer: Self-pay | Admitting: Radiology

## 2024-04-07 ENCOUNTER — Other Ambulatory Visit (HOSPITAL_COMMUNITY): Payer: Self-pay

## 2024-04-07 DIAGNOSIS — Z1231 Encounter for screening mammogram for malignant neoplasm of breast: Secondary | ICD-10-CM | POA: Diagnosis not present

## 2024-04-07 DIAGNOSIS — E669 Obesity, unspecified: Secondary | ICD-10-CM | POA: Diagnosis not present

## 2024-04-07 DIAGNOSIS — M79642 Pain in left hand: Secondary | ICD-10-CM | POA: Diagnosis not present

## 2024-04-07 DIAGNOSIS — Z6837 Body mass index (BMI) 37.0-37.9, adult: Secondary | ICD-10-CM | POA: Diagnosis not present

## 2024-04-07 DIAGNOSIS — Z79899 Other long term (current) drug therapy: Secondary | ICD-10-CM | POA: Diagnosis not present

## 2024-04-07 DIAGNOSIS — M0579 Rheumatoid arthritis with rheumatoid factor of multiple sites without organ or systems involvement: Secondary | ICD-10-CM | POA: Diagnosis not present

## 2024-04-07 DIAGNOSIS — M1991 Primary osteoarthritis, unspecified site: Secondary | ICD-10-CM | POA: Diagnosis not present

## 2024-04-07 DIAGNOSIS — R5383 Other fatigue: Secondary | ICD-10-CM | POA: Diagnosis not present

## 2024-04-07 DIAGNOSIS — M79641 Pain in right hand: Secondary | ICD-10-CM | POA: Diagnosis not present

## 2024-04-07 NOTE — Progress Notes (Signed)
 Specialty Pharmacy Ongoing Clinical Assessment Note  [name] is a 63 y.o. female who is being followed by the specialty pharmacy service for RxSp Rheumatoid Arthritis   Patient's specialty medication(s) reviewed today: Adalimumab (Humira (2 Pen))   Missed doses in the last 4 weeks: 0   Patient/Caregiver did not have any additional questions or concerns.   Therapeutic benefit summary: Patient is achieving benefit   Adverse events/side effects summary: No adverse events/side effects   Patient's therapy is appropriate to: Continue    Goals Addressed             This Visit's Progress    Minimize recurrence of flares   On track    Patient is on track. Patient will maintain adherence         Follow up:  6 months  [name] Specialty Pharmacist

## 2024-04-07 NOTE — Progress Notes (Signed)
 Specialty Pharmacy Refill Coordination Note  [name] is a 63 y.o. female contacted today regarding refills of specialty medication(s) Adalimumab (Humira (2 Pen))   Patient requested Delivery   Delivery date: 04/14/24   Verified address: [address]   [place] [address]   Medication will be filled on 04/13/24.

## 2024-04-07 NOTE — Progress Notes (Signed)
 Clinical Intervention Note  Clinical Intervention Notes: Patient recently started Atorvastatin but subsequently discontinued shortly after due to leg pain. She believes pain was more muscular/ligament in nature and not necessarily joint pain that could be due to her RA. Once Atorvastatin was discontinued she has started feeling better but will speak to rheumatologist as well regardless to follow up on her RA.   Clinical Intervention Outcomes: Prevention of an adverse drug event   Rena Carnes Specialty Pharmacist

## 2024-04-09 ENCOUNTER — Encounter (HOSPITAL_BASED_OUTPATIENT_CLINIC_OR_DEPARTMENT_OTHER): Payer: Self-pay | Admitting: Family Medicine

## 2024-04-13 ENCOUNTER — Other Ambulatory Visit: Payer: Self-pay

## 2024-04-17 ENCOUNTER — Encounter (HOSPITAL_BASED_OUTPATIENT_CLINIC_OR_DEPARTMENT_OTHER): Payer: Self-pay | Admitting: Family Medicine

## 2024-04-20 ENCOUNTER — Other Ambulatory Visit: Payer: Self-pay

## 2024-04-20 MED ORDER — SIMVASTATIN 10 MG PO TABS
10.0000 mg | ORAL_TABLET | Freq: Every day | ORAL | 1 refills | Status: AC
  Filled 2024-04-20: qty 90, 90d supply, fill #0
  Filled 2024-09-09: qty 90, 90d supply, fill #1

## 2024-04-26 ENCOUNTER — Other Ambulatory Visit (HOSPITAL_COMMUNITY): Payer: Self-pay

## 2024-04-27 ENCOUNTER — Other Ambulatory Visit (HOSPITAL_COMMUNITY): Payer: Self-pay

## 2024-04-27 ENCOUNTER — Other Ambulatory Visit: Payer: Self-pay

## 2024-04-27 MED ORDER — METHOTREXATE SODIUM 2.5 MG PO TABS
20.0000 mg | ORAL_TABLET | ORAL | 0 refills | Status: DC
  Filled 2024-04-27: qty 96, 84d supply, fill #0

## 2024-04-29 ENCOUNTER — Ambulatory Visit (HOSPITAL_BASED_OUTPATIENT_CLINIC_OR_DEPARTMENT_OTHER): Admitting: Family Medicine

## 2024-05-07 ENCOUNTER — Other Ambulatory Visit: Payer: Self-pay

## 2024-05-07 NOTE — Progress Notes (Signed)
 Specialty Pharmacy Refill Coordination Note  [name] is a 63 y.o. female contacted today regarding refills of specialty medication(s) Adalimumab  (Humira  (2 Pen))   Patient requested (Patient-Rptd) Delivery   Delivery date: (Patient-Rptd) 05/12/24   Verified address: (Patient-Rptd) [address] [place] [address]   Medication will be filled on 05/11/24.

## 2024-05-12 ENCOUNTER — Other Ambulatory Visit (HOSPITAL_BASED_OUTPATIENT_CLINIC_OR_DEPARTMENT_OTHER): Payer: Commercial Managed Care - PPO

## 2024-05-12 DIAGNOSIS — E785 Hyperlipidemia, unspecified: Secondary | ICD-10-CM | POA: Diagnosis not present

## 2024-05-13 LAB — LIPID PANEL
Chol/HDL Ratio: 3.5 ratio (ref 0.0–4.4)
Cholesterol, Total: 216 mg/dL — ABNORMAL HIGH (ref 100–199)
HDL: 61 mg/dL (ref >39)
LDL Chol Calc (NIH): 140 mg/dL — ABNORMAL HIGH (ref 0–99)
Triglycerides: 82 mg/dL (ref 0–149)
VLDL Cholesterol Cal: 15 mg/dL (ref 5–40)

## 2024-05-19 ENCOUNTER — Encounter (HOSPITAL_BASED_OUTPATIENT_CLINIC_OR_DEPARTMENT_OTHER): Payer: Self-pay | Admitting: Family Medicine

## 2024-05-19 ENCOUNTER — Ambulatory Visit (HOSPITAL_BASED_OUTPATIENT_CLINIC_OR_DEPARTMENT_OTHER): Payer: Commercial Managed Care - PPO | Admitting: Family Medicine

## 2024-05-19 ENCOUNTER — Other Ambulatory Visit: Payer: Self-pay

## 2024-05-19 ENCOUNTER — Other Ambulatory Visit (HOSPITAL_COMMUNITY): Payer: Self-pay

## 2024-05-19 VITALS — BP 130/82 | HR 80 | Ht 66.0 in | Wt 237.0 lb

## 2024-05-19 DIAGNOSIS — I10 Essential (primary) hypertension: Secondary | ICD-10-CM

## 2024-05-19 DIAGNOSIS — E785 Hyperlipidemia, unspecified: Secondary | ICD-10-CM

## 2024-05-19 MED ORDER — FLUTICASONE PROPIONATE 50 MCG/ACT NA SUSP
1.0000 | Freq: Every day | NASAL | 2 refills | Status: DC
  Filled 2024-05-19: qty 16, 60d supply, fill #0
  Filled 2024-08-24: qty 16, 60d supply, fill #1
  Filled 2024-10-19: qty 16, 60d supply, fill #2

## 2024-05-19 NOTE — Assessment & Plan Note (Signed)
 At prior visit, we did start atorvastatin , however patient reports that after a short period of time of being on the medication, she began to notice muscle aches and pains.  Given timing, this was attributed to the medication and she stopped it.  Upon stopping the medication, she did not note gradual improvement in symptoms with this side effect to atorvastatin , we did look to switch patient to lower intensity statin therapy.  She does plan to start medication, however has been holding off as she has upcoming trip to [place]  and did not want to start medication and then have side effects while on her trip.  She will be leaving to go to [place]  in a few weeks. We did discuss possibility that even with lower intensity statin, she may still experience symptoms.  If this is the case, then we can discuss further options including every other day dosing or possibly switching to alternative cholesterol-lowering medication.  If looking to pursue alternative medication class, then likely would refer to lipid clinic for further discussion in this regard.  Will plan to follow-up about 2 months after she returns from [place]  which will allow for appropriate timing with repeat.

## 2024-05-19 NOTE — Assessment & Plan Note (Signed)
Blood pressure at goal in office today.  Patient continues with medication as prescribed including chlorthalidone and lisinopril.  Denies any issues with medication at this time.  No reported headache, chest pain, lightheadedness or dizziness. We will continue with current medication regimen, no changes to be made today.  Recommend intermittent monitoring of blood pressure at home, DASH diet

## 2024-05-19 NOTE — Progress Notes (Signed)
    Procedures performed today:    None.  Independent interpretation of notes and tests performed by another provider:   None.  Brief History, Exam, Impression, and Recommendations:    BP 130/82   Pulse 80   Ht 5\' 6"  (1.676 m)   Wt 237 lb (107.5 kg)   LMP 03/29/2020 (Approximate)   SpO2 95%   BMI 38.25 kg/m   Primary hypertension Assessment & Plan: Blood pressure at goal in office today.  Patient continues with medication as prescribed including chlorthalidone  and lisinopril .  Denies any issues with medication at this time.  No reported headache, chest pain, lightheadedness or dizziness. We will continue with current medication regimen, no changes to be made today.  Recommend intermittent monitoring of blood pressure at home, DASH diet   Hyperlipidemia, unspecified hyperlipidemia type Assessment & Plan: At prior visit, we did start atorvastatin , however patient reports that after a short period of time of being on the medication, she began to notice muscle aches and pains.  Given timing, this was attributed to the medication and she stopped it.  Upon stopping the medication, she did not note gradual improvement in symptoms with this side effect to atorvastatin , we did look to switch patient to lower intensity statin therapy.  She does plan to start medication, however has been holding off as she has upcoming trip to [place]  and did not want to start medication and then have side effects while on her trip.  She will be leaving to go to [place]  in a few weeks. We did discuss possibility that even with lower intensity statin, she may still experience symptoms.  If this is the case, then we can discuss further options including every other day dosing or possibly switching to alternative cholesterol-lowering medication.  If looking to pursue alternative medication class, then likely would refer to lipid clinic for further discussion in this regard.  Will plan to follow-up about 2 months after  she returns from [place]  which will allow for appropriate timing with repeat.  Orders: -     Lipid panel; Future  Other orders -     Fluticasone Propionate; Place 1 spray into both nostrils daily.  Dispense: 16 g; Refill: 2  Return in about 3 months (around 08/19/2024) for hyperlipidemia, hypertension, labs 1 week prior.   ___________________________________________ Deneane Stifter de Peru, MD, ABFM, CAQSM Primary Care and Sports Medicine [facility]

## 2024-05-22 ENCOUNTER — Encounter: Payer: Self-pay | Admitting: Pharmacist

## 2024-05-22 ENCOUNTER — Ambulatory Visit: Attending: Family Medicine | Admitting: Pharmacist

## 2024-05-22 DIAGNOSIS — Z7189 Other specified counseling: Secondary | ICD-10-CM

## 2024-05-22 NOTE — Progress Notes (Signed)
   S: [facility].  Patient is currently taking Humira  for rheumatoid arthritis. Patient is managed by Dr. Meredith Stalls for this.   Adherence: denies any missed doses  Efficacy: reports that the medication is working well for her currently.   Dosing: Rheumatoid arthritis: SubQ: 40 mg every other week (may continue methotrexate , other nonbiologic DMARDS, corticosteroids, NSAIDs, and/or analgesics); patients not taking concomitant methotrexate  may increase dose to 40 mg every week  Dose adjustments: Renal: no dose adjustments (has not been studied) Hepatic: no dose adjustments (has not been studied)  Screening: TB test: completed and was negative per patient Hepatitis: completed per patient  Monitoring: S/sx of infection: denies CBC: monitored q3 months and reports it is WNL S/sx of hypersensitivity: denies S/sx of malignancy: denies S/sx of heart failure: denies  O:     Lab Results  Component Value Date   WBC 5.4 01/07/2024   HGB 13.8 01/07/2024   HCT 42.5 01/07/2024   MCV 94 01/07/2024   PLT 272 01/07/2024      Chemistry      Component Value Date/Time   NA 141 01/07/2024 1014   K 4.0 01/07/2024 1014   CL 99 01/07/2024 1014   CO2 26 01/07/2024 1014   BUN 14 01/07/2024 1014   CREATININE 0.73 01/07/2024 1014      Component Value Date/Time   CALCIUM  9.5 01/07/2024 1014   ALKPHOS 51 01/07/2024 1014   AST 24 01/07/2024 1014   ALT 26 01/07/2024 1014   BILITOT 0.4 01/07/2024 1014       A/P: 1. Medication review: Patient currently on Humira  for rheumatoid arthritis and continues to tolerate it well with no adverse effects. Reviewed the medication with the patient, including the following: Humira  is a TNF blocking agent indicated for ankylosing spondylitis, Crohn's disease, Hidradenitis suppurativa, psoriatic arthritis, plaque psoriasis, ulcerative colitis, and uveitis. Patient educated on  purpose, proper use and potential adverse effects of Humira . The most common adverse effects are infections, headache, and injection site reactions. 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 There is the possibility of an increased risk of malignancy but it is not well understood if this increased risk is due to there medication or the disease state. There are rare cases of pancytopenia and aplastic anemia. No recommendations for any changes at this time.  Marene Shape, PharmD, Becky Bowels, CPP Clinical Pharmacist [facility] [phone]

## 2024-06-01 ENCOUNTER — Other Ambulatory Visit (HOSPITAL_BASED_OUTPATIENT_CLINIC_OR_DEPARTMENT_OTHER): Payer: Self-pay | Admitting: Family Medicine

## 2024-06-01 ENCOUNTER — Other Ambulatory Visit (HOSPITAL_COMMUNITY): Payer: Self-pay

## 2024-06-01 ENCOUNTER — Other Ambulatory Visit: Payer: Self-pay | Admitting: Internal Medicine

## 2024-06-01 ENCOUNTER — Other Ambulatory Visit: Payer: Self-pay

## 2024-06-01 MED ORDER — CHLORTHALIDONE 25 MG PO TABS
25.0000 mg | ORAL_TABLET | Freq: Every day | ORAL | 2 refills | Status: AC
  Filled 2024-06-01: qty 90, 90d supply, fill #0
  Filled 2024-09-09: qty 90, 90d supply, fill #1
  Filled 2024-12-05: qty 90, 90d supply, fill #2

## 2024-06-01 MED ORDER — LISINOPRIL 20 MG PO TABS
10.0000 mg | ORAL_TABLET | Freq: Every day | ORAL | 2 refills | Status: AC
  Filled 2024-06-01: qty 45, 90d supply, fill #0
  Filled 2024-09-09: qty 45, 90d supply, fill #1
  Filled 2024-12-05: qty 45, 90d supply, fill #2

## 2024-06-09 ENCOUNTER — Other Ambulatory Visit: Payer: Self-pay

## 2024-06-09 ENCOUNTER — Other Ambulatory Visit: Payer: Self-pay | Admitting: Pharmacist

## 2024-06-09 ENCOUNTER — Other Ambulatory Visit (HOSPITAL_COMMUNITY): Payer: Self-pay

## 2024-06-09 MED ORDER — HUMIRA (2 PEN) 40 MG/0.4ML ~~LOC~~ AJKT
AUTO-INJECTOR | SUBCUTANEOUS | 4 refills | Status: DC
Start: 2024-06-09 — End: 2024-10-26
  Filled 2024-06-09: qty 2, 30d supply, fill #0
  Filled 2024-07-03: qty 2, 30d supply, fill #1
  Filled 2024-07-31: qty 2, 30d supply, fill #2
  Filled 2024-08-27: qty 2, 30d supply, fill #3
  Filled 2024-09-25: qty 2, 30d supply, fill #4

## 2024-06-09 MED ORDER — HUMIRA (2 PEN) 40 MG/0.4ML ~~LOC~~ AJKT
AUTO-INJECTOR | SUBCUTANEOUS | 4 refills | Status: DC
  Filled 2024-06-09: qty 2, 30d supply, fill #0

## 2024-06-09 NOTE — Progress Notes (Signed)
 Specialty Pharmacy Refill Coordination Note  [name] is a 63 y.o. female contacted today regarding refills of specialty medication(s) Adalimumab  (Humira  (2 Pen))   Patient requested Delivery   Delivery date: 06/10/24   Verified address: [address] [place] [address]   Medication will be filled on 06.17.25.

## 2024-06-10 ENCOUNTER — Other Ambulatory Visit: Payer: Self-pay

## 2024-06-11 ENCOUNTER — Other Ambulatory Visit (HOSPITAL_COMMUNITY): Payer: Self-pay

## 2024-06-11 ENCOUNTER — Other Ambulatory Visit: Payer: Self-pay

## 2024-06-11 MED ORDER — HUMIRA (2 PEN) 40 MG/0.4ML ~~LOC~~ AJKT
40.0000 mg | AUTO-INJECTOR | SUBCUTANEOUS | 4 refills | Status: DC
  Filled 2024-10-26: qty 2, 28d supply, fill #0

## 2024-06-30 ENCOUNTER — Other Ambulatory Visit (HOSPITAL_COMMUNITY): Payer: Self-pay

## 2024-07-03 ENCOUNTER — Other Ambulatory Visit: Payer: Self-pay | Admitting: Pharmacy Technician

## 2024-07-03 ENCOUNTER — Encounter (INDEPENDENT_AMBULATORY_CARE_PROVIDER_SITE_OTHER): Payer: Self-pay

## 2024-07-03 ENCOUNTER — Other Ambulatory Visit: Payer: Self-pay

## 2024-07-03 NOTE — Progress Notes (Signed)
 Specialty Pharmacy Refill Coordination Note  [name] is a 63 y.o. female contacted today regarding refills of specialty medication(s) Adalimumab  (Humira  (2 Pen))   Patient requested (Patient-Rptd) Delivery   Delivery date: 07/08/24 Verified address: (Patient-Rptd) [address] [place] [address]   Medication will be filled on 07/07/24.

## 2024-07-17 ENCOUNTER — Other Ambulatory Visit (HOSPITAL_COMMUNITY): Payer: Self-pay

## 2024-07-18 ENCOUNTER — Other Ambulatory Visit (HOSPITAL_COMMUNITY): Payer: Self-pay

## 2024-07-19 ENCOUNTER — Other Ambulatory Visit (HOSPITAL_COMMUNITY): Payer: Self-pay

## 2024-07-20 ENCOUNTER — Other Ambulatory Visit (HOSPITAL_COMMUNITY): Payer: Self-pay

## 2024-07-20 ENCOUNTER — Other Ambulatory Visit: Payer: Self-pay

## 2024-07-20 MED ORDER — FOLIC ACID 1 MG PO TABS
2.0000 mg | ORAL_TABLET | Freq: Every day | ORAL | 1 refills | Status: DC
  Filled 2024-07-20: qty 180, 90d supply, fill #0
  Filled 2024-10-14: qty 180, 90d supply, fill #1

## 2024-07-20 MED ORDER — METHOTREXATE SODIUM 2.5 MG PO TABS
20.0000 mg | ORAL_TABLET | ORAL | 0 refills | Status: DC
  Filled 2024-07-20: qty 96, 84d supply, fill #0

## 2024-07-21 DIAGNOSIS — M0579 Rheumatoid arthritis with rheumatoid factor of multiple sites without organ or systems involvement: Secondary | ICD-10-CM | POA: Diagnosis not present

## 2024-07-31 ENCOUNTER — Other Ambulatory Visit: Payer: Self-pay

## 2024-07-31 ENCOUNTER — Encounter (INDEPENDENT_AMBULATORY_CARE_PROVIDER_SITE_OTHER): Payer: Self-pay

## 2024-08-03 ENCOUNTER — Other Ambulatory Visit: Payer: Self-pay

## 2024-08-03 ENCOUNTER — Other Ambulatory Visit: Payer: Self-pay | Admitting: Pharmacy Technician

## 2024-08-03 NOTE — Progress Notes (Signed)
 Specialty Pharmacy Refill Coordination Note  [name] is a 63 y.o. female contacted today regarding refills of specialty medication(s) Adalimumab  (Humira  (2 Pen))   Patient requested Delivery  Delivery date: 08/04/24 Verified address: [address] [place], [place] [address]   Medication will be filled on 08/03/24.   Patient answered Questionnaire on 07/31/24

## 2024-08-24 ENCOUNTER — Other Ambulatory Visit (HOSPITAL_COMMUNITY): Payer: Self-pay

## 2024-08-27 ENCOUNTER — Other Ambulatory Visit: Payer: Self-pay | Admitting: Pharmacy Technician

## 2024-08-27 ENCOUNTER — Encounter (INDEPENDENT_AMBULATORY_CARE_PROVIDER_SITE_OTHER): Payer: Self-pay

## 2024-08-27 ENCOUNTER — Other Ambulatory Visit: Payer: Self-pay

## 2024-08-27 NOTE — Progress Notes (Signed)
 Specialty Pharmacy Refill Coordination Note  BELENDA ALVIAR is a 63 y.o. female contacted today regarding refills of specialty medication(s) Adalimumab  (Humira  (2 Pen))   Patient requested (Patient-Rptd) Delivery   Delivery date: 09/02/24 Verified address: (Patient-Rptd) [address] [place] [address]   Medication will be filled on 09/01/24.

## 2024-09-01 ENCOUNTER — Other Ambulatory Visit: Payer: Self-pay

## 2024-09-10 ENCOUNTER — Other Ambulatory Visit (HOSPITAL_COMMUNITY): Payer: Self-pay

## 2024-09-24 ENCOUNTER — Other Ambulatory Visit: Payer: Self-pay

## 2024-09-24 ENCOUNTER — Encounter (INDEPENDENT_AMBULATORY_CARE_PROVIDER_SITE_OTHER): Payer: Self-pay

## 2024-09-25 ENCOUNTER — Other Ambulatory Visit: Payer: Self-pay | Admitting: Pharmacy Technician

## 2024-09-25 ENCOUNTER — Other Ambulatory Visit: Payer: Self-pay

## 2024-09-25 NOTE — Progress Notes (Signed)
 Specialty Pharmacy Refill Coordination Note  [name] is a 63 y.o. female contacted today regarding refills of specialty medication(s) Adalimumab  (Humira  (2 Pen))   Patient requested (Patient-Rptd) Delivery   Delivery date: 10/01/24 Verified address: (Patient-Rptd) [address] [place]   Medication will be filled on 09/30/24.

## 2024-09-29 ENCOUNTER — Other Ambulatory Visit (HOSPITAL_BASED_OUTPATIENT_CLINIC_OR_DEPARTMENT_OTHER): Payer: Self-pay | Admitting: *Deleted

## 2024-09-29 ENCOUNTER — Other Ambulatory Visit: Payer: Self-pay

## 2024-09-29 DIAGNOSIS — E785 Hyperlipidemia, unspecified: Secondary | ICD-10-CM

## 2024-09-30 ENCOUNTER — Other Ambulatory Visit: Payer: Self-pay

## 2024-09-30 ENCOUNTER — Ambulatory Visit (HOSPITAL_BASED_OUTPATIENT_CLINIC_OR_DEPARTMENT_OTHER): Payer: Self-pay | Admitting: Family Medicine

## 2024-09-30 LAB — LIPID PANEL
Chol/HDL Ratio: 2.9 ratio (ref 0.0–4.4)
Cholesterol, Total: 183 mg/dL (ref 100–199)
HDL: 63 mg/dL (ref >39)
LDL Chol Calc (NIH): 96 mg/dL (ref 0–99)
Triglycerides: 140 mg/dL (ref 0–149)
VLDL Cholesterol Cal: 24 mg/dL (ref 5–40)

## 2024-10-06 ENCOUNTER — Ambulatory Visit (INDEPENDENT_AMBULATORY_CARE_PROVIDER_SITE_OTHER): Admitting: Family Medicine

## 2024-10-06 ENCOUNTER — Encounter (HOSPITAL_BASED_OUTPATIENT_CLINIC_OR_DEPARTMENT_OTHER): Payer: Self-pay | Admitting: Family Medicine

## 2024-10-06 VITALS — BP 128/79 | HR 87 | Ht 66.0 in | Wt 240.0 lb

## 2024-10-06 DIAGNOSIS — E785 Hyperlipidemia, unspecified: Secondary | ICD-10-CM | POA: Diagnosis not present

## 2024-10-06 DIAGNOSIS — M79662 Pain in left lower leg: Secondary | ICD-10-CM | POA: Diagnosis not present

## 2024-10-06 DIAGNOSIS — Z0000 Encounter for general adult medical examination without abnormal findings: Secondary | ICD-10-CM

## 2024-10-06 NOTE — Assessment & Plan Note (Signed)
 This has been going on for several months since earlier in the year.  Worsened with increased activity, walking, prolonged standing.  This is primarily at inferior aspect of calf muscle.  Has not noticed any specific swelling in the area. On exam, she does not have any tenderness to palpation over Achilles tendon, however at musculotendinous junction, she does have some tenderness beginning which extends proximally into calf.  She does not have any pain centrally over calf, no significant pitting edema of left lower extremity.  Normal range of motion at left ankle. Suspect that pain is not related to the medication.  This is more likely to be related to specific underlying strain of calf near musculotendinous junction.  Given chronicity of symptoms, would recommend further evaluation and treatment with physical therapy, patient amenable, referral placed today.

## 2024-10-06 NOTE — Assessment & Plan Note (Signed)
 Patient was able to start with statin, reports that [name] generally has been tolerating well.  [name] did have cholesterol panel completed before her visit today and this also shows notable improvement in total cholesterol as well as LDL cholesterol. [name] has had some calf pain discussed separately, however this is isolated to 1 area, not affecting both legs and not present diffusely. Given progress thus far, would be reasonable to continue with statin.  Could consider switching to taking whole tablet.  [name] would prefer to focus on having calf pain addressed and once resolved, may look to take full dose of statin therapy.  We can monitor cholesterol panel with labs before next appointment for physical

## 2024-10-06 NOTE — Patient Instructions (Signed)
  Medication Instructions:  Your physician recommends that you continue on your current medications as directed. Please refer to the Current Medication list given to you today. --If you need a refill on any your medications before your next appointment, please call your pharmacy first. If no refills are authorized on file call the office.-- Lab Work: Your physician has recommended that you have lab work today: 1 week before next visit  If you have labs (blood work) drawn today and your tests are completely normal, you will receive your results via MyChart message OR a phone call from our staff.  Please ensure you check your voicemail in the event that you authorized detailed messages to be left on a delegated number. If you have any lab test that is abnormal or we need to change your treatment, we will call you to review the results.  Referrals/Procedures/Imaging: Physical Therapy  Follow-Up: Your next appointment:   Your physician recommends that you schedule a follow-up appointment in: 4 months physical  with Dr. de Peru  You will receive a text message or e-mail with a link to a survey about your care and experience with us  today! We would greatly appreciate your feedback!   Thanks for letting us  be apart of your health journey!!  Primary Care and Sports Medicine   Dr. Quintin sheerer Peru   We encourage you to activate your patient portal called MyChart.  Sign up information is provided on this After Visit Summary.  MyChart is used to connect with patients for Virtual Visits (Telemedicine).  Patients are able to view lab/test results, encounter notes, upcoming appointments, etc.  Non-urgent messages can be sent to your provider as well. To learn more about what you can do with MyChart, please visit --  [email or web address].

## 2024-10-06 NOTE — Progress Notes (Signed)
    Procedures performed today:    None.  Independent interpretation of notes and tests performed by another provider:   None.  Brief History, Exam, Impression, and Recommendations:    BP 128/79 (BP Location: Right Arm, Patient Position: Sitting, Cuff Size: Normal)   Pulse 87   Ht 5' 6 (1.676 m)   Wt 240 lb (108.9 kg)   LMP 03/29/2020 (Approximate)   SpO2 96%   BMI 38.74 kg/m   Hyperlipidemia, unspecified hyperlipidemia type Assessment & Plan: Patient was able to start with statin, reports that she generally has been tolerating well.  She did have cholesterol panel completed before her visit today and this also shows notable improvement in total cholesterol as well as LDL cholesterol. She has had some calf pain discussed separately, however this is isolated to 1 area, not affecting both legs and not present diffusely. Given progress thus far, would be reasonable to continue with statin.  Could consider switching to taking whole tablet.  She would prefer to focus on having calf pain addressed and once resolved, may look to take full dose of statin therapy.  We can monitor cholesterol panel with labs before next appointment for physical   Wellness examination -     CBC with Differential/Platelet; Future -     Comprehensive metabolic panel with GFR; Future -     Hemoglobin A1c; Future -     Lipid panel; Future -     TSH Rfx on Abnormal to Free T4; Future  Pain of left calf Assessment & Plan: This has been going on for several months since earlier in the year.  Worsened with increased activity, walking, prolonged standing.  This is primarily at inferior aspect of calf muscle.  Has not noticed any specific swelling in the area. On exam, she does not have any tenderness to palpation over Achilles tendon, however at musculotendinous junction, she does have some tenderness beginning which extends proximally into calf.  She does not have any pain centrally over calf, no significant  pitting edema of left lower extremity.  Normal range of motion at left ankle. Suspect that pain is not related to the medication.  This is more likely to be related to specific underlying strain of calf near musculotendinous junction.  Given chronicity of symptoms, would recommend further evaluation and treatment with physical therapy, patient amenable, referral placed today.  Orders: -     Ambulatory referral to Physical Therapy  Return in about 4 months (around 02/06/2025) for CPE with fasting labs 1 week prior.   ___________________________________________ Beverly Faison de Peru, MD, ABFM, CAQSM Primary Care and Sports Medicine [facility]

## 2024-10-13 ENCOUNTER — Encounter: Payer: Self-pay | Admitting: Dermatology

## 2024-10-13 ENCOUNTER — Ambulatory Visit: Payer: Commercial Managed Care - PPO | Admitting: Dermatology

## 2024-10-13 VITALS — BP 131/62

## 2024-10-13 DIAGNOSIS — L814 Other melanin hyperpigmentation: Secondary | ICD-10-CM

## 2024-10-13 DIAGNOSIS — D1801 Hemangioma of skin and subcutaneous tissue: Secondary | ICD-10-CM | POA: Diagnosis not present

## 2024-10-13 DIAGNOSIS — W908XXA Exposure to other nonionizing radiation, initial encounter: Secondary | ICD-10-CM

## 2024-10-13 DIAGNOSIS — M1991 Primary osteoarthritis, unspecified site: Secondary | ICD-10-CM | POA: Diagnosis not present

## 2024-10-13 DIAGNOSIS — Z1283 Encounter for screening for malignant neoplasm of skin: Secondary | ICD-10-CM | POA: Diagnosis not present

## 2024-10-13 DIAGNOSIS — E669 Obesity, unspecified: Secondary | ICD-10-CM | POA: Diagnosis not present

## 2024-10-13 DIAGNOSIS — L821 Other seborrheic keratosis: Secondary | ICD-10-CM

## 2024-10-13 DIAGNOSIS — L570 Actinic keratosis: Secondary | ICD-10-CM

## 2024-10-13 DIAGNOSIS — M79641 Pain in right hand: Secondary | ICD-10-CM | POA: Diagnosis not present

## 2024-10-13 DIAGNOSIS — M0579 Rheumatoid arthritis with rheumatoid factor of multiple sites without organ or systems involvement: Secondary | ICD-10-CM | POA: Diagnosis not present

## 2024-10-13 DIAGNOSIS — L820 Inflamed seborrheic keratosis: Secondary | ICD-10-CM | POA: Diagnosis not present

## 2024-10-13 DIAGNOSIS — M7662 Achilles tendinitis, left leg: Secondary | ICD-10-CM | POA: Diagnosis not present

## 2024-10-13 DIAGNOSIS — M79642 Pain in left hand: Secondary | ICD-10-CM | POA: Diagnosis not present

## 2024-10-13 DIAGNOSIS — D229 Melanocytic nevi, unspecified: Secondary | ICD-10-CM

## 2024-10-13 DIAGNOSIS — L578 Other skin changes due to chronic exposure to nonionizing radiation: Secondary | ICD-10-CM | POA: Diagnosis not present

## 2024-10-13 DIAGNOSIS — Z79899 Other long term (current) drug therapy: Secondary | ICD-10-CM | POA: Diagnosis not present

## 2024-10-13 DIAGNOSIS — Z6837 Body mass index (BMI) 37.0-37.9, adult: Secondary | ICD-10-CM | POA: Diagnosis not present

## 2024-10-13 NOTE — Patient Instructions (Addendum)

## 2024-10-13 NOTE — Progress Notes (Signed)
 Total Body Skin Exam (TBSE) Visit   Subjective  [name] is a 63 y.o. female ESTABLISHED PATIENT who presents for the following:  Total Body Skin Exam (TBSE)  Patient was last evaluated for TBSE on 10/08/23 .  Patient does not have spots of concern to be evaluated. She does not apply sunscreen and/or wears protective coverings. Has Hx of Bx.   The patient has spots, moles and lesions to be evaluated, some may be new or changing and the patient has concerns that these could be cancer.  The following portions of the chart were reviewed this encounter and updated as appropriate: medications, allergies, medical history  Review of Systems:  No other skin or systemic complaints except as noted in HPI or Assessment and Plan.  Objective  Well appearing patient in no apparent distress; mood and affect are within normal limits.  A full examination was performed including scalp, head, eyes, ears, nose, lips, neck, chest, axillae, abdomen, back, buttocks, bilateral upper extremities, bilateral lower extremities, hands, feet, fingers, toes, fingernails, and toenails. All findings within normal limits unless otherwise noted below.   Relevant physical exam findings are noted in the Assessment and Plan.  Right Forehead Erythematous thin papules/macules with gritty scale.   Assessment & Plan   LENTIGINES, SEBORRHEIC KERATOSES, HEMANGIOMAS - Benign normal skin lesions - Benign-appearing - Call for any changes  BENIGN MELANOCYTIC NEVI - Tan-brown and/or pink-flesh-colored symmetric macules and papules - Benign appearing on exam today - Observation - Call clinic for new or changing moles - Recommend daily use of broad spectrum spf 30+ sunscreen to sun-exposed areas.   MILD ACTINIC DAMAGE - Chronic condition, secondary to cumulative UV/sun exposure - diffuse scaly erythematous macules with underlying dyspigmentation - Recommend daily broad spectrum sunscreen SPF 30+ to sun-exposed  areas, reapply every 2 hours as needed.  - Staying in the shade or wearing long sleeves, sun glasses (UVA+UVB protection) and wide brim hats (4-inch brim around the entire circumference of the hat) are also recommended for sun protection.  - Call for new or changing lesions.  ACTINIC KERATOSIS Exam: Erythematous thin papules/macules with gritty scale at the R eyebrow  Treatment Plan: - Cryotherapy performed with liquid nitrogen today   SKIN CANCER SCREENING PERFORMED TODAY.   AK (ACTINIC KERATOSIS) Right Forehead Destruction of lesion - Right Forehead Complexity: simple   Destruction method: cryotherapy   Informed consent: discussed and consent obtained   Timeout:  patient name, date of birth, surgical site, and procedure verified Lesion destroyed using liquid nitrogen: Yes   Region frozen until ice ball extended beyond lesion: Yes   Outcome: patient tolerated procedure well with no complications   Post-procedure details: wound care instructions given    INFLAMED SEBORRHEIC KERATOSIS (13) Mid Back (12), Right Thigh - Anterior Destruction of lesion - Right Thigh - Anterior Complexity: simple   Destruction method: cryotherapy   Informed consent: discussed and consent obtained   Timeout:  patient name, date of birth, surgical site, and procedure verified Lesion destroyed using liquid nitrogen: Yes   Region frozen until ice ball extended beyond lesion: Yes   Outcome: patient tolerated procedure well with no complications   Post-procedure details: wound care instructions given    Return in about 1 year (around 10/13/2025) for TBSE.   Documentation: I have reviewed the above documentation for accuracy and completeness, and I agree with the above.  I, Shirron Maranda, CMA, am acting as scribe for [employer], DO.   Delon Lenis, DO

## 2024-10-14 ENCOUNTER — Other Ambulatory Visit (HOSPITAL_COMMUNITY): Payer: Self-pay

## 2024-10-19 ENCOUNTER — Other Ambulatory Visit (HOSPITAL_COMMUNITY): Payer: Self-pay

## 2024-10-19 ENCOUNTER — Other Ambulatory Visit: Payer: Self-pay

## 2024-10-19 MED ORDER — METHOTREXATE SODIUM 2.5 MG PO TABS
20.0000 mg | ORAL_TABLET | ORAL | 0 refills | Status: DC
  Filled 2024-10-19: qty 96, 84d supply, fill #0

## 2024-10-23 ENCOUNTER — Telehealth: Payer: Self-pay

## 2024-10-23 ENCOUNTER — Encounter (INDEPENDENT_AMBULATORY_CARE_PROVIDER_SITE_OTHER): Payer: Self-pay

## 2024-10-23 ENCOUNTER — Other Ambulatory Visit: Payer: Self-pay

## 2024-10-23 NOTE — Telephone Encounter (Signed)
 Pharmacy Patient Advocate Encounter   Received notification from [facility] that prior authorization for Humira  CF is required/requested.   Insurance verification completed.   The patient is insured through [facility].   Per test claim: PA required; PA submitted to above mentioned insurance via Latent Key/confirmation #/[id number] Status is pending

## 2024-10-26 ENCOUNTER — Other Ambulatory Visit (HOSPITAL_COMMUNITY): Payer: Self-pay

## 2024-10-26 ENCOUNTER — Other Ambulatory Visit: Payer: Self-pay

## 2024-10-26 ENCOUNTER — Other Ambulatory Visit: Payer: Self-pay | Admitting: Pharmacist

## 2024-10-26 MED ORDER — HUMIRA (2 PEN) 40 MG/0.4ML ~~LOC~~ AJKT: AUTO-INJECTOR | SUBCUTANEOUS | 5 refills | Status: DC

## 2024-10-26 MED ORDER — HUMIRA (2 PEN) 40 MG/0.4ML ~~LOC~~ AJKT
AUTO-INJECTOR | SUBCUTANEOUS | 5 refills | Status: AC
  Filled 2024-10-26: qty 2, 28d supply, fill #0
  Filled 2024-11-17: qty 2, 28d supply, fill #1
  Filled 2024-12-16: qty 2, 28d supply, fill #2
  Filled 2025-01-12: qty 2, 28d supply, fill #3

## 2024-10-26 NOTE — Telephone Encounter (Signed)
 Pharmacy Patient Advocate Encounter  Received notification from [facility] that Prior Authorization for Humira  CF has been APPROVED from 10/23/24 to 10/22/25   PA #/Case ID/Reference #: [id number]

## 2024-10-26 NOTE — Progress Notes (Signed)
 Specialty Pharmacy Refill Coordination Note  [name] is a 63 y.o. female contacted today regarding refills of specialty medication(s) Adalimumab  (Humira  (2 Pen))   Patient requested No data recorded  Delivery date: 10/28/24   Verified address: [address] [place], [place] [address]   Medication will be filled on: 10/27/24

## 2024-11-10 ENCOUNTER — Other Ambulatory Visit: Payer: Self-pay

## 2024-11-10 ENCOUNTER — Ambulatory Visit (HOSPITAL_BASED_OUTPATIENT_CLINIC_OR_DEPARTMENT_OTHER): Attending: Family Medicine | Admitting: Physical Therapy

## 2024-11-10 ENCOUNTER — Encounter (HOSPITAL_BASED_OUTPATIENT_CLINIC_OR_DEPARTMENT_OTHER): Payer: Self-pay | Admitting: Physical Therapy

## 2024-11-10 DIAGNOSIS — R262 Difficulty in walking, not elsewhere classified: Secondary | ICD-10-CM | POA: Diagnosis not present

## 2024-11-10 DIAGNOSIS — M79662 Pain in left lower leg: Secondary | ICD-10-CM | POA: Diagnosis not present

## 2024-11-10 DIAGNOSIS — M6281 Muscle weakness (generalized): Secondary | ICD-10-CM | POA: Insufficient documentation

## 2024-11-10 DIAGNOSIS — R29898 Other symptoms and signs involving the musculoskeletal system: Secondary | ICD-10-CM | POA: Diagnosis not present

## 2024-11-10 NOTE — Therapy (Signed)
 OUTPATIENT PHYSICAL THERAPY LOWER EXTREMITY EVALUATION   Patient Name: [name] MRN: [id number] DOB:05/22/1961, 63 y.o., female Today's Date: 11/10/2024  END OF SESSION:  PT End of Session - 11/10/24 1311     Visit Number 1    Number of Visits 7    Date for Recertification  12/22/24    Authorization Type [facility] [employer]    PT Start Time 1148    PT Stop Time 1226    PT Time Calculation (min) 38 min    Activity Tolerance Patient tolerated treatment well    Behavior During Therapy [facility] for tasks assessed/performed          Past Medical History:  Diagnosis Date   Allergy    Arthritis    rheumatoid arthritis   Asthma    as a child    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 08/23/2021   08/23/21 Surgical pathology UTERUS, CERVIX, BILATERAL FALLOPIAN TUBES, HYSTERECTOMY AND SALPINGECTOMY:  - Foci of complex endometrial hyperplasia with focal atypia  - No evidence of invasive carcinoma  - Benign unremarkable cervix  - Benign unremarkable bilateral fallopian tubes   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 08/23/2021   08/23/21 Surgical pathology UTERUS, CERVIX, BILATERAL FALLOPIAN TUBES, HYSTERECTOMY AND SALPINGECTOMY:  - Foci of complex endometrial hyperplasia with focal atypia  - No evidence of invasive carcinoma  - Benign unremarkable cervix  - Benign unremarkable bilateral fallopian tubes   GERD (gastroesophageal reflux disease)    Helicobacter pylori antibody positive 2006   Tx w/ PPI, amoxicillin  and Biaxin   Hemorrhoids    History of gestational diabetes    yrs ago with 1st pregnancy   Hyperlipidemia    Hypertension    Obesity    PMB (postmenopausal bleeding) 08/15/2021   Pneumonia    as child   Postmenopausal bleeding 06/23/2021   Ultrasound showed 8.6 mm EM, 3 cm fibroid   Psoriasis 08/15/2021   PVC's (premature ventricular contractions)    occasional R/O cardiac via [place] yrs ago   Rheumatoid arthritis (HCC)    Wears glasses    or  contacts   Past Surgical History:  Procedure Laterality Date   ABDOMINAL HYSTERECTOMY     CERVICAL POLYPECTOMY  12/22/2012   Procedure: CERVICAL POLYPECTOMY;  Surgeon: Hargis Paradise, MD;  Location: [facility];  Service: Gynecology;  Laterality: N/A;   CESAREAN SECTION     x 2 1999 and 2001   COLONOSCOPY     colonscopy and endoscopy  08/05/2020   HYSTEROSCOPY WITH D & C  12/22/2012   Procedure: DILATATION AND CURETTAGE /HYSTEROSCOPY;  Surgeon: Hargis Paradise, MD;  Location: [facility];  Service: Gynecology;;   LAPAROSCOPIC VAGINAL HYSTERECTOMY WITH SALPINGECTOMY Bilateral 08/23/2021   Procedure: LAPAROSCOPIC ASSISTED VAGINAL HYSTERECTOMY WITH BILATERAL SALPINGECTOMY;  Surgeon: Herchel Gloris LABOR, MD;  Location: [facility];  Service: Gynecology;  Laterality: Bilateral;   PLANTAR'S WART EXCISION     8 yrs ago per pt on 08-15-2021   Patient Active Problem List   Diagnosis Date Noted   Pain of left calf 10/06/2024   Obesity (BMI 30-39.9) 06/07/2023   Hepatic steatosis 06/07/2023   Hyperlipidemia 06/07/2023   COVID-19 04/16/2023   Wellness examination 01/01/2023   Rheumatoid arthritis involving multiple sites with positive rheumatoid factor (HCC) 09/22/2022   Sensorineural hearing loss (SNHL) of left ear with unrestricted hearing of right ear 09/22/2022   S/P laparoscopic assisted vaginal hysterectomy (LAVH) 08/23/2021   Hypertension 01/22/2012   Psoriasis 06/06/1969  PCP: de [place], Quintin PARAS, MD  REFERRING PROVIDER: de [place], Raymond J, MD  REFERRING DIAG:  Diagnosis  [phone] (ICD-10-CM) - Pain of left calf    THERAPY DIAG:  Pain of left calf  Muscle weakness (generalized)  Difficulty in walking, not elsewhere classified  Other symptoms and signs involving the musculoskeletal system  Rationale for Evaluation and Treatment: Rehabilitation  ONSET DATE: chronic   SUBJECTIVE:   SUBJECTIVE STATEMENT:  Pain started when I had to stand a lot at the time of my  mother's funeral, its gotten to the point where I finally asked my doctor if there's something we can do about it. I do have RA, tried two statins this year- first statin made both legs hurt in the calves, second statin caused more joint pains but not muscle pains. Rheumatologist said I should ask PT about sciatica, this can happen in some RA patients. I have a hot pain in my left quad and sometimes back pain. If I walk much distance at all, the calf gets tight and hurts to the point I have to stop and stretch. Stopped statins completely. MD had no concerns about possible DVT.   PERTINENT HISTORY: See above  PAIN:  Are you having pain? No 0/10 now at rest, can get up to 7-8/10 when walking   PRECAUTIONS: None  RED FLAGS: None   WEIGHT BEARING RESTRICTIONS: No  FALLS:  Has patient fallen in last 6 months? 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    PATIENT SURVEYS:  PSFS: THE PATIENT SPECIFIC FUNCTIONAL SCALE  Place score of 0-10 (0 = unable to perform activity and 10 = able to perform activity at the same level as before injury or problem)  Activity Date: 11/10/24 Eval     Walking for extended distances  3    2.      3.     4.      Total Score 3      Total Score = Sum of activity scores/number of activities  Minimally Detectable Change: 3 points (for single activity); 2 points (for average score)  [facility] (nd). The Patient Specific Functional Scale . Retrieved from  [email or web address]   COGNITION: Overall cognitive status: Within functional limits for tasks assessed     SENSATION: WFL    MUSCLE LENGTH: L HS 50% impaired L piriformis OK    PALPATION:  TTP mid-gastroc down achilles   LOWER EXTREMITY ROM:  Active ROM Left eval  Hip flexion   Hip extension   Hip abduction   Hip adduction   Hip internal rotation   Hip external rotation   Knee flexion   Knee extension   Ankle dorsiflexion 18*  Ankle plantarflexion 60*  Ankle inversion 14*  Ankle eversion 13*   (Blank rows = not tested)  LOWER EXTREMITY MMT:  MMT Right eval Left eval  Hip flexion 3+ 4-  Hip extension    Hip abduction 5 4+  Hip adduction    Hip internal rotation    Hip external rotation    Knee  flexion 3+ 3+  Knee extension 5 5  Ankle dorsiflexion 5 5  Ankle plantarflexion 3- at best standing test    2 at best standing test   Ankle inversion    Ankle eversion     (Blank rows = not tested)                                                                                                                                 TREATMENT DATE:   11/10/24  Eval, POC, HEP    HEP practice and discussion as appropriate    PATIENT EDUCATION:  Education details: exam findings, POC, HEP, prognosis with PT and methods used to address chronic pain, benefits of water PT if she is interested in context of RA   Person educated: Patient Education method: Explanation, Demonstration, and Handouts Education comprehension: verbalized understanding, returned demonstration, and needs further education  HOME EXERCISE PROGRAM: Access Code: [email or web address] URL: [email or web address] Date: 11/10/2024 Prepared by: Josette Rough  Exercises - Supine Bridge  - 1 x daily - 6 x weekly - 2 sets - 10 reps - 2 seconds  hold - Hooklying Hamstring Stretch with Strap  - 1 x daily - 6 x weekly - 2 sets - 3 reps - 30 seconds   hold - Seated Ankle Plantar Flexion with Resistance Loop  - 1 x daily - 6 x weekly - 2 sets - 10 reps - Gastroc Stretch with Foot at Wall  - 1 x daily - 6 x weekly - 2 sets - 3 reps - 30 seconds  hold - Calf Mobilization with Small Ball  - 1 x daily - 6 x weekly - 1 sets - 1 reps - 5 min  hold  ASSESSMENT:  CLINICAL IMPRESSION:  Patient is a 63 y.o. F who was seen today for physical therapy evaluation and treatment for Diagnosis M79.662 (ICD-10-CM) - Pain of left calf.  Objectives as above.  Her pain is more towards the musculo-tendinous junction, she has stopped statins but will continue to monitor for signs of tendon injury. Will make every effort to address pain and functional and assist in return to pain free activity.   OBJECTIVE IMPAIRMENTS: Abnormal gait, decreased balance, decreased coordination, decreased mobility, difficulty walking, decreased ROM, decreased strength, increased fascial restrictions, increased muscle spasms, impaired flexibility, obesity, and pain.   ACTIVITY LIMITATIONS: standing, squatting, stairs, and locomotion level  PARTICIPATION LIMITATIONS: meal prep, cleaning, laundry, shopping, community activity, and occupation  PERSONAL FACTORS: Age, Fitness, Past/current experiences, Profession, and Time since onset of injury/illness/exacerbation are also affecting patient's functional outcome.   REHAB POTENTIAL: Good  CLINICAL DECISION MAKING: Stable/uncomplicated  EVALUATION COMPLEXITY: Low   GOALS: Goals reviewed with patient? No  SHORT TERM GOALS: Target date: 12/01/2024   Will be compliant with appropriate progressive HEP  Baseline: Goal status: INITIAL  2.  Sx to have improved by at least 25% Baseline:  Goal status: INITIAL  LONG TERM GOALS: Target date: 12/22/2024    MMT to be 5/5 globally  Baseline:  Goal status: INITIAL  2.  Ankle AROM to be WNL all planes of motion  Baseline:  Goal status: INITIAL  3.  L HS flexibility to be no  more than 25% limited  Baseline:  Goal status: INITIAL  4.  Will be able to ambulate for at least 30 seconds without pain  Baseline:  Goal status: INITIAL  5.  Will be able to perform all community and exercise activities without pain  Baseline:  Goal status: INITIAL    PLAN:  PT FREQUENCY: 1x/week  PT DURATION: 6 weeks  PLANNED INTERVENTIONS: 97164- PT Re-evaluation, 97750- Physical Performance Testing, 97110-Therapeutic exercises, 97530- Therapeutic activity, [id number]- Neuromuscular re-education, 97535- Self Care, 02859- Manual therapy, [id number]- Gait training, [phone]- Aquatic Therapy, [phone]- Ultrasound, [phone]- Ionotophoresis 4mg /ml Dexamethasone , and Patient/Family education  PLAN FOR NEXT SESSION: general strength and flexibility, calf/ankle strengthening, monitor sx for signs of tendon injury   Josette Rough, PT, DPT 11/10/24 1:12 PM

## 2024-11-17 ENCOUNTER — Other Ambulatory Visit: Payer: Self-pay

## 2024-11-17 ENCOUNTER — Other Ambulatory Visit (HOSPITAL_COMMUNITY): Payer: Self-pay

## 2024-11-17 ENCOUNTER — Ambulatory Visit (HOSPITAL_BASED_OUTPATIENT_CLINIC_OR_DEPARTMENT_OTHER): Admitting: Physical Therapy

## 2024-11-17 ENCOUNTER — Encounter (HOSPITAL_BASED_OUTPATIENT_CLINIC_OR_DEPARTMENT_OTHER): Payer: Self-pay | Admitting: Physical Therapy

## 2024-11-17 DIAGNOSIS — R262 Difficulty in walking, not elsewhere classified: Secondary | ICD-10-CM

## 2024-11-17 DIAGNOSIS — M6281 Muscle weakness (generalized): Secondary | ICD-10-CM | POA: Diagnosis not present

## 2024-11-17 DIAGNOSIS — M79662 Pain in left lower leg: Secondary | ICD-10-CM | POA: Diagnosis not present

## 2024-11-17 DIAGNOSIS — R29898 Other symptoms and signs involving the musculoskeletal system: Secondary | ICD-10-CM | POA: Diagnosis not present

## 2024-11-17 NOTE — Therapy (Addendum)
 OUTPATIENT PHYSICAL THERAPY LOWER EXTREMITY TREATMENT   Patient Name: [name] MRN: [id number] DOB:July 14, 1961, 63 y.o., female Today's Date: 11/17/2024  END OF SESSION:  PT End of Session - 11/17/24 1142     Visit Number 2    Number of Visits 7    Date for Recertification  12/22/24    Authorization Type [facility] [employer]    PT Start Time 1145    PT Stop Time 1230    PT Time Calculation (min) 45 min    Activity Tolerance Patient tolerated treatment well    Behavior During Therapy [facility] for tasks assessed/performed          Past Medical History:  Diagnosis Date   Allergy    Arthritis    rheumatoid arthritis   Asthma    as a child    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 08/23/2021   08/23/21 Surgical pathology UTERUS, CERVIX, BILATERAL FALLOPIAN TUBES, HYSTERECTOMY AND SALPINGECTOMY:  - Foci of complex endometrial hyperplasia with focal atypia  - No evidence of invasive carcinoma  - Benign unremarkable cervix  - Benign unremarkable bilateral fallopian tubes   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 08/23/2021   08/23/21 Surgical pathology UTERUS, CERVIX, BILATERAL FALLOPIAN TUBES, HYSTERECTOMY AND SALPINGECTOMY:  - Foci of complex endometrial hyperplasia with focal atypia  - No evidence of invasive carcinoma  - Benign unremarkable cervix  - Benign unremarkable bilateral fallopian tubes   GERD (gastroesophageal reflux disease)    Helicobacter pylori antibody positive 2006   Tx w/ PPI, amoxicillin  and Biaxin   Hemorrhoids    History of gestational diabetes    yrs ago with 1st pregnancy   Hyperlipidemia    Hypertension    Obesity    PMB (postmenopausal bleeding) 08/15/2021   Pneumonia    as child   Postmenopausal bleeding 06/23/2021   Ultrasound showed 8.6 mm EM, 3 cm fibroid   Psoriasis 08/15/2021   PVC's (premature ventricular contractions)    occasional R/O cardiac via [facility] yrs ago   Rheumatoid arthritis (HCC)    Wears glasses    or  contacts   Past Surgical History:  Procedure Laterality Date   ABDOMINAL HYSTERECTOMY     CERVICAL POLYPECTOMY  12/22/2012   Procedure: CERVICAL POLYPECTOMY;  Surgeon: Hargis Paradise, MD;  Location: [facility];  Service: Gynecology;  Laterality: N/A;   CESAREAN SECTION     x 2 1999 and 2001   COLONOSCOPY     colonscopy and endoscopy  08/05/2020   HYSTEROSCOPY WITH D & C  12/22/2012   Procedure: DILATATION AND CURETTAGE /HYSTEROSCOPY;  Surgeon: Hargis Paradise, MD;  Location: [facility];  Service: Gynecology;;   LAPAROSCOPIC VAGINAL HYSTERECTOMY WITH SALPINGECTOMY Bilateral 08/23/2021   Procedure: LAPAROSCOPIC ASSISTED VAGINAL HYSTERECTOMY WITH BILATERAL SALPINGECTOMY;  Surgeon: Herchel Gloris LABOR, MD;  Location: [facility];  Service: Gynecology;  Laterality: Bilateral;   PLANTAR'S WART EXCISION     8 yrs ago per pt on 08-15-2021   Patient Active Problem List   Diagnosis Date Noted   Pain of left calf 10/06/2024   Obesity (BMI 30-39.9) 06/07/2023   Hepatic steatosis 06/07/2023   Hyperlipidemia 06/07/2023   COVID-19 04/16/2023   Wellness examination 01/01/2023   Rheumatoid arthritis involving multiple sites with positive rheumatoid factor (HCC) 09/22/2022   Sensorineural hearing loss (SNHL) of left ear with unrestricted hearing of right ear 09/22/2022   S/P laparoscopic assisted vaginal hysterectomy (LAVH) 08/23/2021   Hypertension 01/22/2012   Psoriasis 06/06/1969  PCP: de [place], Quintin PARAS, MD  REFERRING PROVIDER: de [place], Raymond J, MD  REFERRING DIAG:  Diagnosis  [phone] (ICD-10-CM) - Pain of left calf    THERAPY DIAG:  Pain of left calf  Difficulty in walking, not elsewhere classified  Other symptoms and signs involving the musculoskeletal system  Rationale for Evaluation and Treatment: Rehabilitation  ONSET DATE: chronic   SUBJECTIVE:   SUBJECTIVE STATEMENT:  Reports she is doing okay today. Hasn't had to walk long periods of time since previous  session so doesn't really know if exercises help a lot.   PERTINENT HISTORY: See above  PAIN:  Are you having pain? No 0/10 now at rest, can get up to 7-8/10 when walking   PRECAUTIONS: None  RED FLAGS: None   WEIGHT BEARING RESTRICTIONS: No  FALLS:  Has patient fallen in last 6 months? 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    PATIENT SURVEYS:  PSFS: THE PATIENT SPECIFIC FUNCTIONAL SCALE  Place score of 0-10 (0 = unable to perform activity and 10 = able to perform activity at the same level as before injury or problem)  Activity Date: 11/10/24 Eval     Walking for extended distances  3    2.      3.     4.      Total Score 3      Total Score = Sum of activity scores/number of activities  Minimally Detectable Change: 3 points (for single activity); 2 points (for average score)  [facility] (nd). The Patient Specific Functional Scale . Retrieved from [email or web address]   COGNITION: Overall cognitive status: Within functional limits for tasks assessed     SENSATION: WFL    MUSCLE LENGTH: L HS 50% impaired L piriformis OK    PALPATION:  TTP mid-gastroc down achilles   LOWER EXTREMITY ROM:  Active ROM Left eval  Hip flexion   Hip extension   Hip abduction   Hip adduction   Hip internal rotation   Hip external rotation   Knee flexion   Knee extension   Ankle dorsiflexion 18*  Ankle plantarflexion 60*  Ankle inversion 14*  Ankle  eversion 13*   (Blank rows = not tested)  LOWER EXTREMITY MMT:  MMT Right eval Left eval  Hip flexion 3+ 4-  Hip extension    Hip abduction 5 4+  Hip adduction    Hip internal rotation    Hip external rotation    Knee flexion 3+ 3+  Knee extension 5 5  Ankle dorsiflexion 5 5  Ankle plantarflexion 3- at best standing test    2 at best standing test   Ankle inversion    Ankle eversion     (Blank rows = not tested)  TREATMENT DATE:  11/17/24 STM and trigger point release to gastroc/soleus Gait assessment in hallway  Calf raises x10  Calf raises 2 up 1 down x10   Sidelying clamshell x10 bilaterally    11/10/24  Eval, POC, HEP    HEP practice and discussion as appropriate    PATIENT EDUCATION:  Education details: exam findings, POC, HEP, prognosis with PT and methods used to address chronic pain, benefits of water PT if she is interested in context of RA   Person educated: Patient Education method: Explanation, Demonstration, and Handouts Education comprehension: verbalized understanding, returned demonstration, and needs further education  HOME EXERCISE PROGRAM: Access Code: [email or web address] URL: [email or web address] Date: 11/10/2024 Prepared by: Josette Rough  Exercises - Supine Bridge  - 1 x daily - 6 x weekly - 2 sets - 10 reps - 2 seconds  hold - Hooklying Hamstring Stretch with Strap  - 1 x daily - 6 x weekly - 2 sets - 3 reps - 30 seconds  hold - Seated Ankle Plantar Flexion with Resistance Loop  - 1 x daily - 6 x weekly - 2 sets - 10 reps - Gastroc Stretch with Foot at Wall  - 1 x daily - 6 x weekly - 2 sets - 3 reps - 30 seconds  hold - Calf Mobilization with Small Ball  - 1 x daily - 6 x weekly - 1 sets - 1 reps - 5 min  hold  ASSESSMENT:  CLINICAL IMPRESSION:  Tolerated exercises well but had increased pain in calf  after gait assessment in hallway. Pt has compensated trendelenburg pattern when ambulating along with decreased knee extension on L knee due to knee pain. Ambulates with initial contact striking on forefoot. Educated on findings and progressed to glute strengthening while continued to work on geologist, engineering. Verbal cueing for increased glute activation and proper breathing during exercises. Will continue to benefit from therapy to address remaining limitations.   OBJECTIVE IMPAIRMENTS: Abnormal gait, decreased balance, decreased coordination, decreased mobility, difficulty walking, decreased ROM, decreased strength, increased fascial restrictions, increased muscle spasms, impaired flexibility, obesity, and pain.   ACTIVITY LIMITATIONS: standing, squatting, stairs, and locomotion level  PARTICIPATION LIMITATIONS: meal prep, cleaning, laundry, shopping, community activity, and occupation  PERSONAL FACTORS: Age, Fitness, Past/current experiences, Profession, and Time since onset of injury/illness/exacerbation are also affecting patient's functional outcome.   REHAB POTENTIAL: Good  CLINICAL DECISION MAKING: Stable/uncomplicated  EVALUATION COMPLEXITY: Low   GOALS: Goals reviewed with patient? No  SHORT TERM GOALS: Target date: 12/01/2024   Will be compliant with appropriate progressive HEP  Baseline: Goal status: INITIAL  2.  Sx to have improved by at least 25% Baseline:  Goal status: INITIAL     LONG TERM GOALS: Target date: 12/22/2024    MMT to be 5/5 globally  Baseline:  Goal status: INITIAL  2.  Ankle AROM to be WNL all planes of motion  Baseline:  Goal status: INITIAL  3.  L HS flexibility to be no more than 25% limited  Baseline:  Goal status: INITIAL  4.  Will be able to ambulate for at least 30 seconds without pain  Baseline:  Goal status: INITIAL  5.  Will be able to perform all community and exercise activities without pain  Baseline:  Goal  status: INITIAL    PLAN:  PT FREQUENCY: 1x/week  PT DURATION: 6 weeks  PLANNED INTERVENTIONS: 97164- PT Re-evaluation, 97750- Physical Performance Testing, 97110-Therapeutic exercises, 97530- Therapeutic activity, [id number]- Neuromuscular re-education, 97535- Self Care, 02859- Manual  therapy, [phone]- Gait training, 02886- Aquatic Therapy, [phone]- Ultrasound, [phone]- Ionotophoresis 4mg /ml Dexamethasone , and Patient/Family education  PLAN FOR NEXT SESSION: general strength and flexibility, calf/ankle strengthening, monitor sx for signs of tendon injury    Lili Finder, Student-PT 11/17/2024, 12:30 PM  This entire session was performed under direct supervision and direction of a licensed therapist/therapist assistant . I have personally read, edited and approve of the note as written. 1:09 PM, 11/17/24 Prentice CANDIE Stains PT, DPT Physical Therapist at [facility]

## 2024-11-20 ENCOUNTER — Other Ambulatory Visit: Payer: Self-pay

## 2024-11-23 ENCOUNTER — Other Ambulatory Visit: Payer: Self-pay | Admitting: Pharmacy Technician

## 2024-11-23 ENCOUNTER — Other Ambulatory Visit: Payer: Self-pay

## 2024-11-23 NOTE — Progress Notes (Signed)
 Specialty Pharmacy Refill Coordination Note  [name] is a 63 y.o. female contacted today regarding refills of specialty medication(s) Humira   Patient requested Delivery Delivery date: 11/25/2024 Verified address: [address] [place] [address]   Medication will be filled on: 11/24/2024

## 2024-11-24 ENCOUNTER — Ambulatory Visit (HOSPITAL_BASED_OUTPATIENT_CLINIC_OR_DEPARTMENT_OTHER): Admitting: Physical Therapy

## 2024-11-24 ENCOUNTER — Other Ambulatory Visit: Payer: Self-pay

## 2024-11-24 ENCOUNTER — Other Ambulatory Visit (HOSPITAL_COMMUNITY): Payer: Self-pay

## 2024-11-24 ENCOUNTER — Encounter (HOSPITAL_BASED_OUTPATIENT_CLINIC_OR_DEPARTMENT_OTHER): Payer: Self-pay | Admitting: Physical Therapy

## 2024-11-24 DIAGNOSIS — M79662 Pain in left lower leg: Secondary | ICD-10-CM | POA: Diagnosis present

## 2024-11-24 DIAGNOSIS — R29898 Other symptoms and signs involving the musculoskeletal system: Secondary | ICD-10-CM | POA: Insufficient documentation

## 2024-11-24 DIAGNOSIS — M6281 Muscle weakness (generalized): Secondary | ICD-10-CM | POA: Diagnosis present

## 2024-11-24 DIAGNOSIS — R262 Difficulty in walking, not elsewhere classified: Secondary | ICD-10-CM | POA: Insufficient documentation

## 2024-11-24 NOTE — Therapy (Addendum)
 OUTPATIENT PHYSICAL THERAPY LOWER EXTREMITY TREATMENT   Patient Name: [name] MRN: [id number] DOB:December 26, 1960, 63 y.o., female Today's Date: 11/24/2024  END OF SESSION:  PT End of Session - 11/24/24 0756     Visit Number 3    Number of Visits 7    Date for Recertification  12/22/24    Authorization Type [facility] [employer]    PT Start Time [phone]    PT Stop Time [phone]    PT Time Calculation (min) 41 min    Activity Tolerance Patient tolerated treatment well    Behavior During Therapy [facility] for tasks assessed/performed          Past Medical History:  Diagnosis Date   Allergy    Arthritis    rheumatoid arthritis   Asthma    as a child    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 08/23/2021   08/23/21 Surgical pathology UTERUS, CERVIX, BILATERAL FALLOPIAN TUBES, HYSTERECTOMY AND SALPINGECTOMY:  - Foci of complex endometrial hyperplasia with focal atypia  - No evidence of invasive carcinoma  - Benign unremarkable cervix  - Benign unremarkable bilateral fallopian tubes   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 08/23/2021   08/23/21 Surgical pathology UTERUS, CERVIX, BILATERAL FALLOPIAN TUBES, HYSTERECTOMY AND SALPINGECTOMY:  - Foci of complex endometrial hyperplasia with focal atypia  - No evidence of invasive carcinoma  - Benign unremarkable cervix  - Benign unremarkable bilateral fallopian tubes   GERD (gastroesophageal reflux disease)    Helicobacter pylori antibody positive 2006   Tx w/ PPI, amoxicillin  and Biaxin   Hemorrhoids    History of gestational diabetes    yrs ago with 1st pregnancy   Hyperlipidemia    Hypertension    Obesity    PMB (postmenopausal bleeding) 08/15/2021   Pneumonia    as child   Postmenopausal bleeding 06/23/2021   Ultrasound showed 8.6 mm EM, 3 cm fibroid   Psoriasis 08/15/2021   PVC's (premature ventricular contractions)    occasional R/O cardiac via [place] yrs ago   Rheumatoid arthritis (HCC)    Wears glasses    or  contacts   Past Surgical History:  Procedure Laterality Date   ABDOMINAL HYSTERECTOMY     CERVICAL POLYPECTOMY  12/22/2012   Procedure: CERVICAL POLYPECTOMY;  Surgeon: Hargis Paradise, MD;  Location: [facility];  Service: Gynecology;  Laterality: N/A;   CESAREAN SECTION     x 2 1999 and 2001   COLONOSCOPY     colonscopy and endoscopy  08/05/2020   HYSTEROSCOPY WITH D & C  12/22/2012   Procedure: DILATATION AND CURETTAGE /HYSTEROSCOPY;  Surgeon: Hargis Paradise, MD;  Location: [facility];  Service: Gynecology;;   LAPAROSCOPIC VAGINAL HYSTERECTOMY WITH SALPINGECTOMY Bilateral 08/23/2021   Procedure: LAPAROSCOPIC ASSISTED VAGINAL HYSTERECTOMY WITH BILATERAL SALPINGECTOMY;  Surgeon: Herchel Gloris LABOR, MD;  Location: [facility];  Service: Gynecology;  Laterality: Bilateral;   PLANTAR'S WART EXCISION     8 yrs ago per pt on 08-15-2021   Patient Active Problem List   Diagnosis Date Noted   Pain of left calf 10/06/2024   Obesity (BMI 30-39.9) 06/07/2023   Hepatic steatosis 06/07/2023   Hyperlipidemia 06/07/2023   COVID-19 04/16/2023   Wellness examination 01/01/2023   Rheumatoid arthritis involving multiple sites with positive rheumatoid factor (HCC) 09/22/2022   Sensorineural hearing loss (SNHL) of left ear with unrestricted hearing of right ear 09/22/2022   S/P laparoscopic assisted vaginal hysterectomy (LAVH) 08/23/2021   Hypertension 01/22/2012   Psoriasis 06/06/1969  PCP: de [place], Raymond J, MD  REFERRING PROVIDER: de [place], Raymond J, MD  REFERRING DIAG:  Diagnosis  [phone] (ICD-10-CM) - Pain of left calf    THERAPY DIAG:  Pain of left calf  Difficulty in walking, not elsewhere classified  Other symptoms and signs involving the musculoskeletal system  Muscle weakness (generalized)  Rationale for Evaluation and Treatment: Rehabilitation  ONSET DATE: chronic   SUBJECTIVE:   SUBJECTIVE STATEMENT:  Reports she didn't have much problem at the game over the  weekend but has had some increased stiffness since then. Feels like her leg has hurt all over since then.   PERTINENT HISTORY: See above  PAIN:  Are you having pain? No 0/10 now at rest, can get up to 7-8/10 when walking   PRECAUTIONS: None  RED FLAGS: None   WEIGHT BEARING RESTRICTIONS: No  FALLS:  Has patient fallen in last 6 months? 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    PATIENT SURVEYS:  PSFS: THE PATIENT SPECIFIC FUNCTIONAL SCALE  Place score of 0-10 (0 = unable to perform activity and 10 = able to perform activity at the same level as before injury or problem)  Activity Date: 11/10/24 Eval     Walking for extended distances  3    2.      3.     4.      Total Score 3      Total Score = Sum of activity scores/number of activities  Minimally Detectable Change: 3 points (for single activity); 2 points (for average score)  [facility] (nd). The Patient Specific Functional Scale . Retrieved from [email or web address]   COGNITION: Overall cognitive status: Within functional limits for tasks assessed     SENSATION: WFL    MUSCLE LENGTH: L HS 50% impaired L piriformis OK    PALPATION:  TTP mid-gastroc down achilles   LOWER EXTREMITY ROM:  Active ROM Left eval  Hip flexion   Hip extension   Hip abduction   Hip adduction   Hip internal rotation   Hip external rotation   Knee flexion   Knee extension   Ankle dorsiflexion 18*   Ankle plantarflexion 60*  Ankle inversion 14*  Ankle eversion 13*   (Blank rows = not tested)  LOWER EXTREMITY MMT:  MMT Right eval Left eval  Hip flexion 3+ 4-  Hip extension    Hip abduction 5 4+  Hip adduction    Hip internal rotation    Hip external rotation    Knee flexion 3+ 3+  Knee extension 5 5  Ankle dorsiflexion 5 5  Ankle plantarflexion 3- at best standing test    2 at best standing test   Ankle inversion    Ankle eversion     (Blank rows = not tested)  TREATMENT DATE:  11/24/24 STM and trigger point release to gastroc/soleus, anterior tib, fibularis muscles, quads  LAQ x10 SL clamshell x5 Glute bridges x10 Soleus stretch x30 seconds   11/17/24 STM and trigger point release to gastroc/soleus Gait assessment in hallway  Calf raises x10  Calf raises 2 up 1 down x10   Sidelying clamshell x10 bilaterally    11/10/24  Eval, POC, HEP    HEP practice and discussion as appropriate    PATIENT EDUCATION:  Education details: exam findings, POC, HEP, prognosis with PT and methods used to address chronic pain, benefits of water PT if she is interested in context of RA   Person educated: Patient Education method: Explanation, Demonstration, and Handouts Education comprehension: verbalized understanding, returned demonstration, and needs further education  HOME EXERCISE PROGRAM: Access Code: [email or web address] URL: [email or web address] Date: 11/10/2024 Prepared by: Josette Rough  Exercises - Supine Bridge  - 1 x daily - 6 x weekly - 2 sets - 10 reps - 2 seconds  hold - Hooklying Hamstring Stretch with Strap  - 1 x daily - 6 x weekly - 2 sets - 3 reps - 30 seconds  hold - Seated Ankle Plantar Flexion with Resistance Loop  - 1 x daily - 6 x weekly - 2 sets - 10 reps - Gastroc Stretch with Foot at Wall  - 1 x daily - 6 x  weekly - 2 sets - 3 reps - 30 seconds  hold - Calf Mobilization with Small Ball  - 1 x daily - 6 x weekly - 1 sets - 1 reps - 5 min  hold  ASSESSMENT:  CLINICAL IMPRESSION:  Tolerated exercises well with no significant increase in pain. Manual utilized to decrease hyperactive musculature in LLE and patient benefited with decrease in pain and stiffness. Verbal cueing for increased glute activation and proper breathing during exercises. Educated on estate manager/land agent and updated HEP with quad strengthening exercises. Will continue to benefit from therapy to address remaining limitations.   OBJECTIVE IMPAIRMENTS: Abnormal gait, decreased balance, decreased coordination, decreased mobility, difficulty walking, decreased ROM, decreased strength, increased fascial restrictions, increased muscle spasms, impaired flexibility, obesity, and pain.   ACTIVITY LIMITATIONS: standing, squatting, stairs, and locomotion level  PARTICIPATION LIMITATIONS: meal prep, cleaning, laundry, shopping, community activity, and occupation  PERSONAL FACTORS: Age, Fitness, Past/current experiences, Profession, and Time since onset of injury/illness/exacerbation are also affecting patient's functional outcome.   REHAB POTENTIAL: Good  CLINICAL DECISION MAKING: Stable/uncomplicated  EVALUATION COMPLEXITY: Low   GOALS: Goals reviewed with patient? No  SHORT TERM GOALS: Target date: 12/01/2024   Will be compliant with appropriate progressive HEP  Baseline: Goal status: INITIAL  2.  Sx to have improved by at least 25% Baseline:  Goal status: INITIAL     LONG TERM GOALS: Target date: 12/22/2024    MMT to be 5/5 globally  Baseline:  Goal status: INITIAL  2.  Ankle AROM to be WNL all planes of motion  Baseline:  Goal status: INITIAL  3.  L HS flexibility to be no more than 25% limited  Baseline:  Goal status: INITIAL  4.  Will be able to ambulate for at least 30 seconds without pain  Baseline:  Goal  status: INITIAL  5.  Will be able to perform all community and exercise activities without pain  Baseline:  Goal status: INITIAL    PLAN:  PT FREQUENCY: 1x/week  PT DURATION: 6 weeks  PLANNED INTERVENTIONS: 97164- PT Re-evaluation, 97750- Physical Performance Testing, 97110-Therapeutic exercises, 97530-  Therapeutic activity, [id number]- Neuromuscular re-education, [phone]- Self Care, 02859- Manual therapy, [phone]- Gait training, [phone]- Aquatic Therapy, [phone]- Ultrasound, [phone]- Ionotophoresis 4mg /ml Dexamethasone , and Patient/Family education  PLAN FOR NEXT SESSION: general strength and flexibility, calf/ankle strengthening, monitor sx for signs of tendon injury, gait training    Lili Finder, Student-PT 11/24/2024, 8:42 AM  This entire session was performed under direct supervision and direction of a licensed therapist/therapist assistant . I have personally read, edited and approve of the note as written. 8:54 AM, 11/24/24 Prentice CANDIE Stains PT, DPT Physical Therapist at [facility]

## 2024-11-27 ENCOUNTER — Other Ambulatory Visit (HOSPITAL_COMMUNITY): Payer: Self-pay

## 2024-11-27 DIAGNOSIS — H5213 Myopia, bilateral: Secondary | ICD-10-CM | POA: Diagnosis not present

## 2024-12-01 ENCOUNTER — Ambulatory Visit (HOSPITAL_BASED_OUTPATIENT_CLINIC_OR_DEPARTMENT_OTHER): Admitting: Physical Therapy

## 2024-12-01 DIAGNOSIS — M79662 Pain in left lower leg: Secondary | ICD-10-CM | POA: Diagnosis not present

## 2024-12-01 NOTE — Therapy (Unsigned)
 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 08/23/2021   08/23/21 Surgical pathology UTERUS, CERVIX, BILATERAL FALLOPIAN TUBES, HYSTERECTOMY AND SALPINGECTOMY:  - Foci of complex endometrial hyperplasia with focal atypia  - No evidence of invasive carcinoma  - Benign unremarkable cervix  - Benign unremarkable bilateral fallopian tubes   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 08/23/2021   08/23/21 Surgical pathology UTERUS, CERVIX, BILATERAL FALLOPIAN TUBES, HYSTERECTOMY AND SALPINGECTOMY:  - Foci of complex endometrial hyperplasia with focal atypia  - No evidence of invasive carcinoma  - Benign unremarkable cervix  - Benign unremarkable bilateral fallopian tubes   GERD (gastroesophageal reflux disease)    Helicobacter pylori antibody positive 2006   Tx w/ PPI, amoxicillin  and Biaxin   Hemorrhoids    History of gestational diabetes    yrs ago with 1st pregnancy   Hyperlipidemia    Hypertension    Obesity    PMB (postmenopausal bleeding) 08/15/2021   Pneumonia    as child   Postmenopausal bleeding 06/23/2021   Ultrasound showed 8.6 mm EM, 3 cm fibroid   Psoriasis 08/15/2021   PVC's (premature ventricular contractions)    occasional R/O cardiac via [place] yrs ago   Rheumatoid arthritis (HCC)    Wears glasses    or contacts   Past Surgical History:  Procedure Laterality Date   ABDOMINAL HYSTERECTOMY     CERVICAL POLYPECTOMY  12/22/2012   Procedure: CERVICAL POLYPECTOMY;  Surgeon: Hargis Paradise, MD;  Location: [facility];  Service: Gynecology;  Laterality: N/A;   CESAREAN SECTION     x 2 1999 and 2001   COLONOSCOPY     colonscopy and endoscopy  08/05/2020    HYSTEROSCOPY WITH D & C  12/22/2012   Procedure: DILATATION AND CURETTAGE /HYSTEROSCOPY;  Surgeon: Hargis Paradise, MD;  Location: [facility];  Service: Gynecology;;   LAPAROSCOPIC VAGINAL HYSTERECTOMY WITH SALPINGECTOMY Bilateral 08/23/2021   Procedure: LAPAROSCOPIC ASSISTED VAGINAL HYSTERECTOMY WITH BILATERAL SALPINGECTOMY;  Surgeon: Herchel Gloris LABOR, MD;  Location: [facility];  Service: Gynecology;  Laterality: Bilateral;   PLANTAR'S WART EXCISION     8 yrs ago per pt on 08-15-2021   Patient Active Problem List   Diagnosis Date Noted   Pain of left calf 10/06/2024   Obesity (BMI 30-39.9) 06/07/2023   Hepatic steatosis 06/07/2023   Hyperlipidemia 06/07/2023   COVID-19 04/16/2023   Wellness examination 01/01/2023   Rheumatoid arthritis involving multiple sites with positive rheumatoid factor (HCC) 09/22/2022   Sensorineural hearing loss (SNHL) of left ear with unrestricted hearing of right ear 09/22/2022   S/P laparoscopic assisted vaginal hysterectomy (LAVH) 08/23/2021   Hypertension 01/22/2012   Psoriasis 06/06/1969    PCP: de [place], Raymond J, MD  REFERRING PROVIDER: de [place], Raymond J, MD  REFERRING DIAG:  Diagnosis  [phone] (ICD-10-CM) - Pain of left calf    THERAPY DIAG:  No diagnosis found.  Rationale for Evaluation and Treatment: Rehabilitation  ONSET DATE: chronic   SUBJECTIVE:   SUBJECTIVE STATEMENT:  Reports she didn't have much problem at the game over the weekend but has had some increased stiffness since then. Feels like her leg has hurt all over since  then.   PERTINENT HISTORY: See above  PAIN:  Are you having pain? No 0/10 now at rest, can get up to 7-8/10 when walking   PRECAUTIONS: None  RED FLAGS: None   WEIGHT BEARING RESTRICTIONS: No  FALLS:  Has patient fallen in last 6 months? 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    PATIENT SURVEYS:  PSFS: THE PATIENT SPECIFIC FUNCTIONAL SCALE  Place score of 0-10 (0 = unable to perform activity and 10 = able to perform activity at the same level as before injury or problem)  Activity Date: 11/10/24 Eval     Walking for extended distances  3    2.      3.     4.      Total Score 3      Total Score = Sum of activity scores/number of activities  Minimally Detectable Change: 3 points (for single activity); 2 points (for average score)  [facility] (nd). The Patient Specific Functional Scale . Retrieved from [email or web address]   COGNITION: Overall cognitive status: Within functional limits for tasks assessed     SENSATION: WFL    MUSCLE LENGTH: L HS 50% impaired L piriformis OK    PALPATION:  TTP mid-gastroc down achilles   LOWER EXTREMITY ROM:  Active ROM Left eval  Hip flexion   Hip extension   Hip abduction   Hip adduction   Hip internal rotation   Hip external rotation   Knee flexion   Knee extension   Ankle dorsiflexion 18*  Ankle plantarflexion 60*  Ankle inversion 14*  Ankle eversion 13*   (Blank rows = not tested)  LOWER EXTREMITY MMT:  MMT Right eval Left eval  Hip flexion 3+ 4-  Hip extension    Hip abduction 5 4+  Hip adduction    Hip internal rotation    Hip external rotation    Knee flexion 3+ 3+  Knee extension 5 5  Ankle dorsiflexion 5 5  Ankle plantarflexion 3- at best standing test    2 at best standing test   Ankle inversion    Ankle eversion     (Blank rows = not tested)                                                                                                                                  TREATMENT DATE:  12/01/2024 Manual:  Knee extension stretch with distraction  Neuro-re-ed:  Bridge 2x10  Supine March 2x10  SL clam  2x10   There-ex:  LAQ   11/24/24 STM and trigger point release to gastroc/soleus, anterior tib, fibularis muscles, quads  LAQ x10 SL clamshell x5 Glute bridges x10 Soleus stretch x30 seconds   11/17/24 STM and trigger point release to gastroc/soleus Gait assessment in hallway  Calf raises x10  Calf raises 2 up 1 down x10   Sidelying clamshell x10 bilaterally    11/10/24  Eval, POC, HEP    HEP practice and discussion as appropriate    PATIENT EDUCATION:  Education details: exam findings, POC, HEP, prognosis with PT and methods used to address chronic pain, benefits of water PT if she is interested in context of RA   Person educated: Patient Education method: Explanation, Demonstration, and Handouts Education comprehension: verbalized understanding, returned demonstration, and needs further education  HOME EXERCISE PROGRAM: Access Code: [email or web address] URL: [email or web address] Date: 11/10/2024 Prepared by: Josette Rough  Exercises - Supine Bridge  - 1 x daily - 6 x weekly - 2 sets - 10 reps - 2 seconds  hold - Hooklying Hamstring Stretch with Strap  - 1 x daily - 6 x weekly - 2 sets - 3 reps - 30 seconds  hold - Seated Ankle Plantar Flexion with Resistance Loop  - 1 x daily - 6 x weekly - 2 sets - 10 reps - Gastroc Stretch with Foot at Wall  - 1 x daily - 6 x weekly - 2 sets - 3 reps - 30 seconds  hold - Calf Mobilization with Small Ball  - 1 x daily - 6 x weekly - 1 sets - 1 reps - 5 min  hold  ASSESSMENT:  CLINICAL IMPRESSION:  Therapy added a set to all of her exercises today.  OBJECTIVE IMPAIRMENTS: Abnormal gait, decreased balance, decreased coordination, decreased mobility, difficulty walking,  decreased ROM, decreased strength, increased fascial restrictions, increased muscle spasms, impaired flexibility, obesity, and pain.   ACTIVITY LIMITATIONS: standing, squatting, stairs, and locomotion level  PARTICIPATION LIMITATIONS: meal prep, cleaning, laundry, shopping, community activity, and occupation  PERSONAL FACTORS: Age, Fitness, Past/current experiences, Profession, and Time since onset of injury/illness/exacerbation are also affecting patient's functional outcome.   REHAB POTENTIAL: Good  CLINICAL DECISION MAKING: Stable/uncomplicated  EVALUATION COMPLEXITY: Low   GOALS: Goals reviewed with patient? No  SHORT TERM GOALS: Target date: 12/01/2024   Will be compliant with appropriate progressive HEP  Baseline: Goal status: INITIAL  2.  Sx to have improved by at least 25% Baseline:  Goal status: INITIAL     LONG TERM GOALS: Target date: 12/22/2024    MMT to be 5/5 globally  Baseline:  Goal status: INITIAL  2.  Ankle AROM to be WNL all planes of motion  Baseline:  Goal status: INITIAL  3.  L HS flexibility to be no more than 25% limited  Baseline:  Goal status: INITIAL  4.  Will be able to ambulate for at least 30 seconds without pain  Baseline:  Goal status: INITIAL  5.  Will be able to perform all community and exercise activities without pain  Baseline:  Goal status: INITIAL    PLAN:  PT FREQUENCY: 1x/week  PT DURATION: 6 weeks  PLANNED INTERVENTIONS: 97164- PT Re-evaluation, 97750- Physical Performance Testing, 97110-Therapeutic exercises, 97530- Therapeutic activity, [id number]- Neuromuscular re-education, 97535- Self Care, 02859- Manual therapy, [id number]- Gait training, [phone]- Aquatic Therapy, 97035- Ultrasound, 02966- Ionotophoresis 4mg /ml Dexamethasone , and Patient/Family education  PLAN FOR NEXT SESSION: general strength and flexibility, calf/ankle strengthening, monitor sx for signs of tendon injury, gait  training    Alm JINNY Don,  PT 12/01/2024, 1:06 PM  This entire session was performed under direct supervision and direction of a licensed therapist/therapist assistant . I have personally read, edited and approve of the note as written. 1:06 PM, 12/01/24 Prentice CANDIE Stains PT, DPT Physical Therapist at [facility]

## 2024-12-02 ENCOUNTER — Encounter (HOSPITAL_BASED_OUTPATIENT_CLINIC_OR_DEPARTMENT_OTHER): Payer: Self-pay | Admitting: Physical Therapy

## 2024-12-05 ENCOUNTER — Other Ambulatory Visit (HOSPITAL_COMMUNITY): Payer: Self-pay

## 2024-12-07 ENCOUNTER — Other Ambulatory Visit (HOSPITAL_COMMUNITY): Payer: Self-pay

## 2024-12-07 ENCOUNTER — Other Ambulatory Visit: Payer: Self-pay

## 2024-12-07 MED ORDER — MELOXICAM 15 MG PO TABS
15.0000 mg | ORAL_TABLET | Freq: Every day | ORAL | 1 refills | Status: AC
  Filled 2024-12-07: qty 90, 90d supply, fill #0

## 2024-12-07 MED ORDER — FOLIC ACID 1 MG PO TABS
2.0000 mg | ORAL_TABLET | Freq: Two times a day (BID) | ORAL | 3 refills | Status: AC
  Filled 2024-12-07: qty 180, 45d supply, fill #0
  Filled 2025-01-14: qty 360, 90d supply, fill #0

## 2024-12-08 ENCOUNTER — Encounter (HOSPITAL_BASED_OUTPATIENT_CLINIC_OR_DEPARTMENT_OTHER): Payer: Self-pay | Admitting: Physical Therapy

## 2024-12-08 ENCOUNTER — Other Ambulatory Visit (HOSPITAL_COMMUNITY): Payer: Self-pay

## 2024-12-08 ENCOUNTER — Ambulatory Visit (HOSPITAL_BASED_OUTPATIENT_CLINIC_OR_DEPARTMENT_OTHER): Admitting: Physical Therapy

## 2024-12-08 DIAGNOSIS — R262 Difficulty in walking, not elsewhere classified: Secondary | ICD-10-CM

## 2024-12-08 DIAGNOSIS — R29898 Other symptoms and signs involving the musculoskeletal system: Secondary | ICD-10-CM

## 2024-12-08 DIAGNOSIS — M6281 Muscle weakness (generalized): Secondary | ICD-10-CM

## 2024-12-08 DIAGNOSIS — M79662 Pain in left lower leg: Secondary | ICD-10-CM

## 2024-12-08 NOTE — Therapy (Signed)
 OUTPATIENT PHYSICAL THERAPY LOWER EXTREMITY TREATMENT   Patient Name: [name] MRN: [id number] DOB:16-Jul-1961, 63 y.o., female Today's Date: 12/08/2024  END OF SESSION:  PT End of Session - 12/08/24 0840     Visit Number 5    Number of Visits 7    Date for Recertification  12/22/24    Authorization Type [facility] [employer]    PT Start Time 0845    PT Stop Time 0924    PT Time Calculation (min) 39 min    Activity Tolerance Patient tolerated treatment well    Behavior During Therapy [facility] for tasks assessed/performed            Past Medical History:  Diagnosis Date   Allergy    Arthritis    rheumatoid arthritis   Asthma    as a child    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 08/23/2021   08/23/21 Surgical pathology UTERUS, CERVIX, BILATERAL FALLOPIAN TUBES, HYSTERECTOMY AND SALPINGECTOMY:  - Foci of complex endometrial hyperplasia with focal atypia  - No evidence of invasive carcinoma  - Benign unremarkable cervix  - Benign unremarkable bilateral fallopian tubes   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 08/23/2021   08/23/21 Surgical pathology UTERUS, CERVIX, BILATERAL FALLOPIAN TUBES, HYSTERECTOMY AND SALPINGECTOMY:  - Foci of complex endometrial hyperplasia with focal atypia  - No evidence of invasive carcinoma  - Benign unremarkable cervix  - Benign unremarkable bilateral fallopian tubes   GERD (gastroesophageal reflux disease)    Helicobacter pylori antibody positive 2006   Tx w/ PPI, amoxicillin  and Biaxin   Hemorrhoids    History of gestational diabetes    yrs ago with 1st pregnancy   Hyperlipidemia    Hypertension    Obesity    PMB (postmenopausal bleeding) 08/15/2021   Pneumonia    as child   Postmenopausal bleeding 06/23/2021   Ultrasound showed 8.6 mm EM, 3 cm fibroid   Psoriasis 08/15/2021   PVC's (premature ventricular contractions)    occasional R/O cardiac via [place] yrs ago   Rheumatoid arthritis (HCC)    Wears glasses    or  contacts   Past Surgical History:  Procedure Laterality Date   ABDOMINAL HYSTERECTOMY     CERVICAL POLYPECTOMY  12/22/2012   Procedure: CERVICAL POLYPECTOMY;  Surgeon: Hargis Paradise, MD;  Location: [facility];  Service: Gynecology;  Laterality: N/A;   CESAREAN SECTION     x 2 1999 and 2001   COLONOSCOPY     colonscopy and endoscopy  08/05/2020   HYSTEROSCOPY WITH D & C  12/22/2012   Procedure: DILATATION AND CURETTAGE /HYSTEROSCOPY;  Surgeon: Hargis Paradise, MD;  Location: [facility];  Service: Gynecology;;   LAPAROSCOPIC VAGINAL HYSTERECTOMY WITH SALPINGECTOMY Bilateral 08/23/2021   Procedure: LAPAROSCOPIC ASSISTED VAGINAL HYSTERECTOMY WITH BILATERAL SALPINGECTOMY;  Surgeon: Herchel Gloris LABOR, MD;  Location: [facility];  Service: Gynecology;  Laterality: Bilateral;   PLANTAR'S WART EXCISION     8 yrs ago per pt on 08-15-2021   Patient Active Problem List   Diagnosis Date Noted   Pain of left calf 10/06/2024   Obesity (BMI 30-39.9) 06/07/2023   Hepatic steatosis 06/07/2023   Hyperlipidemia 06/07/2023   COVID-19 04/16/2023   Wellness examination 01/01/2023   Rheumatoid arthritis involving multiple sites with positive rheumatoid factor (HCC) 09/22/2022   Sensorineural hearing loss (SNHL) of left ear with unrestricted hearing of right ear 09/22/2022   S/P laparoscopic assisted vaginal hysterectomy (LAVH) 08/23/2021   Hypertension 01/22/2012   Psoriasis 06/06/1969  PCP: de [place], Raymond J, MD  REFERRING PROVIDER: de [place], Raymond J, MD  REFERRING DIAG:  Diagnosis  [phone] (ICD-10-CM) - Pain of left calf    THERAPY DIAG:  Pain of left calf  Other symptoms and signs involving the musculoskeletal system  Difficulty in walking, not elsewhere classified  Muscle weakness (generalized)  Rationale for Evaluation and Treatment: Rehabilitation  ONSET DATE: chronic   SUBJECTIVE:   SUBJECTIVE STATEMENT:  Pain comes and goes, it gets better and worse, I have days  that it hurts more and days that it hurts less. I guess PT is helping but pain relief isn't consistent.   PERTINENT HISTORY: See above  PAIN:  Are you having pain? No 0/10 at rest,   PRECAUTIONS: None  RED FLAGS: None   WEIGHT BEARING RESTRICTIONS: No  FALLS:  Has patient fallen in last 6 months? 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    PATIENT SURVEYS:  PSFS: THE PATIENT SPECIFIC FUNCTIONAL SCALE  Place score of 0-10 (0 = unable to perform activity and 10 = able to perform activity at the same level as before injury or problem)  Activity Date: 11/10/24 Eval  12/08/24  Walking for extended distances  3 2.5  2.     3.    4.     Total Score 3 2.5    Total Score = Sum of activity scores/number of activities  Minimally Detectable Change: 3 points (for single activity); 2 points (for average score)  [facility] (nd). The Patient Specific Functional Scale . Retrieved from [email or web address]   COGNITION: Overall cognitive status: Within functional limits for tasks assessed     SENSATION: WFL    MUSCLE LENGTH: L HS 50% impaired L piriformis OK    PALPATION:  TTP mid-gastroc down achilles   LOWER EXTREMITY ROM:  Active ROM Left eval  Hip flexion   Hip extension   Hip abduction   Hip adduction   Hip internal rotation   Hip external rotation   Knee flexion   Knee extension   Ankle dorsiflexion 18*  Ankle plantarflexion 60*  Ankle  inversion 14*  Ankle eversion 13*   (Blank rows = not tested)  LOWER EXTREMITY MMT:  MMT Right eval Left eval R 12/08/24 L 12/08/24  Hip flexion 3+ 4- 3+ 3+  Hip extension      Hip abduction 5 4+ 4+ 3+  Hip adduction      Hip internal rotation      Hip external rotation      Knee flexion 3+ 3+ 4+ 4+  Knee extension 5 5 5 5   Ankle dorsiflexion 5 5    Ankle plantarflexion 3- at best standing test    2 at best standing test  4- at best standing test  3- at best standing test   Ankle inversion      Ankle eversion       (Blank rows = not tested)  TREATMENT DATE:   12/08/24  Nustep L4-->L2 due to pain x8 minutes, all four extremities seat 7 Shuttle BLE press 75# 2x12  PSFS,MMT, goals  Bridges + ABD into red TB 2x12 Sidelying clams red TB x12 B LAQs red TB x12 B  Standing hip ABD red TB x10 B  Piriformis stretch 2x30 seconds B HS stretch 2x30 seconds B     12/01/2024 Manual:  Knee extension stretch with distraction  Neuro-re-ed:  Bridge 2x10  Supine March 2x10  SL clam 2x10   There-ex:  LAQ 2x10 each leg RPE 5 on left low on right  NuStep 5 minutes level 5  11/24/24 STM and trigger point release to gastroc/soleus, anterior tib, fibularis muscles, quads  LAQ x10 SL clamshell x5 Glute bridges x10 Soleus stretch x30 seconds   11/17/24 STM and trigger point release to gastroc/soleus Gait assessment in hallway  Calf raises x10  Calf raises 2 up 1 down x10   Sidelying clamshell x10 bilaterally    11/10/24  Eval, POC, HEP    HEP practice and discussion as appropriate    PATIENT EDUCATION:  Education details: exam findings, POC, HEP, prognosis with PT and methods used to address chronic pain, benefits of water PT if she is interested in context of RA   Person educated: Patient Education method: Explanation, Demonstration, and  Handouts Education comprehension: verbalized understanding, returned demonstration, and needs further education  HOME EXERCISE PROGRAM: Access Code: [email or web address] URL: [email or web address] Date: 11/10/2024 Prepared by: Josette Rough  Exercises - Supine Bridge  - 1 x daily - 6 x weekly - 2 sets - 10 reps - 2 seconds  hold - Hooklying Hamstring Stretch with Strap  - 1 x daily - 6 x weekly - 2 sets - 3 reps - 30 seconds  hold - Seated Ankle Plantar Flexion with Resistance Loop  - 1 x daily - 6 x weekly - 2 sets - 10 reps - Gastroc Stretch with Foot at Wall  - 1 x daily - 6 x weekly - 2 sets - 3 reps - 30 seconds  hold - Calf Mobilization with Small Ball  - 1 x daily - 6 x weekly - 1 sets - 1 reps - 5 min  hold  ASSESSMENT:  CLINICAL IMPRESSION:  Arrives today doing OK, per her report she has not had consistent relief from pain- it has very much been coming and going, can be in different places too. Making slow progress towards goals, but per objectives remains weak especially L LE. Required up to Mod cues for good exercise form.     OBJECTIVE IMPAIRMENTS: Abnormal gait, decreased balance, decreased coordination, decreased mobility, difficulty walking, decreased ROM, decreased strength, increased fascial restrictions, increased muscle spasms, impaired flexibility, obesity, and pain.   ACTIVITY LIMITATIONS: standing, squatting, stairs, and locomotion level  PARTICIPATION LIMITATIONS: meal prep, cleaning, laundry, shopping, community activity, and occupation  PERSONAL FACTORS: Age, Fitness, Past/current experiences, Profession, and Time since onset of injury/illness/exacerbation are also affecting patient's functional outcome.   REHAB POTENTIAL: Good  CLINICAL DECISION MAKING: Stable/uncomplicated  EVALUATION COMPLEXITY: Low   GOALS: Goals reviewed with patient? No  SHORT TERM GOALS: Target date: 12/01/2024   Will be compliant with appropriate progressive HEP   Baseline: Goal status: ONGOING 12/08/24   2.  Sx to have improved by at least 25% Baseline:  Goal status: ONGOING 12/08/24      LONG TERM GOALS: Target date: 12/22/2024    MMT to be 5/5 globally  Baseline:  Goal  status: ONGOING 12/08/24   2.  Ankle AROM to be WNL all planes of motion  Baseline:  Goal status: INITIAL  3.  L HS flexibility to be no more than 25% limited  Baseline:  Goal status: INITIAL  4.  Will be able to ambulate for at least 30 minutes without pain  Baseline:  Goal status: ONGOING/ADDENDED 12/08/24 (PT accidentally wrote seconds instead of minutes initially)  5.  Will be able to perform all community and exercise activities without pain  Baseline:  Goal status: INITIAL    PLAN:  PT FREQUENCY: 1x/week  PT DURATION: 6 weeks  PLANNED INTERVENTIONS: 97164- PT Re-evaluation, 97750- Physical Performance Testing, 97110-Therapeutic exercises, 97530- Therapeutic activity, [id number]- Neuromuscular re-education, 97535- Self Care, 02859- Manual therapy, [id number]- Gait training, [id number]- Aquatic Therapy, [phone]- Ultrasound, [id number]- Ionotophoresis 4mg /ml Dexamethasone , and Patient/Family education  PLAN FOR NEXT SESSION: general strength and flexibility, calf/ankle strengthening, monitor sx for signs of tendon injury, gait training. Increase PREs as tolerated.Re-cert next visit    Josette Rough, PT, DPT 12/08/2024 9:25 AM

## 2024-12-16 ENCOUNTER — Other Ambulatory Visit (HOSPITAL_COMMUNITY): Payer: Self-pay

## 2024-12-18 ENCOUNTER — Other Ambulatory Visit (HOSPITAL_COMMUNITY): Payer: Self-pay

## 2024-12-18 ENCOUNTER — Other Ambulatory Visit: Payer: Self-pay

## 2024-12-18 NOTE — Progress Notes (Signed)
 Specialty Pharmacy Refill Coordination Note  DYMIN DINGLEDINE is a 63 y.o. female contacted today regarding refills of specialty medication(s) Adalimumab  (Humira  (2 Pen))   Patient requested Delivery   Delivery date: 12/23/24   Verified address: [address] [place], [place] [address]   Medication will be filled on: 12/22/24

## 2024-12-22 ENCOUNTER — Encounter (HOSPITAL_BASED_OUTPATIENT_CLINIC_OR_DEPARTMENT_OTHER): Payer: Self-pay | Admitting: Physical Therapy

## 2024-12-22 ENCOUNTER — Ambulatory Visit (HOSPITAL_BASED_OUTPATIENT_CLINIC_OR_DEPARTMENT_OTHER): Admitting: Physical Therapy

## 2024-12-22 DIAGNOSIS — R262 Difficulty in walking, not elsewhere classified: Secondary | ICD-10-CM

## 2024-12-22 DIAGNOSIS — M79662 Pain in left lower leg: Secondary | ICD-10-CM

## 2024-12-22 DIAGNOSIS — R29898 Other symptoms and signs involving the musculoskeletal system: Secondary | ICD-10-CM

## 2024-12-22 DIAGNOSIS — M6281 Muscle weakness (generalized): Secondary | ICD-10-CM

## 2024-12-22 NOTE — Therapy (Signed)
 " OUTPATIENT PHYSICAL THERAPY LOWER EXTREMITY TREATMENT   Patient Name: [name] MRN: [id number] DOB:[birth year], 63 y.o., female Today's Date: 12/22/2024  END OF SESSION:  PT End of Session - 12/22/24 1150     Visit Number 6    Number of Visits 12    Date for Recertification  02/02/25    Authorization Type [facility] [employer]    PT Start Time 1149    PT Stop Time 1228    PT Time Calculation (min) 39 min    Activity Tolerance Patient tolerated treatment well    Behavior During Therapy [facility] for tasks assessed/performed             Past Medical History:  Diagnosis Date   Allergy    Arthritis    rheumatoid arthritis   Asthma    as a child    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 08/23/2021   08/23/21 Surgical pathology UTERUS, CERVIX, BILATERAL FALLOPIAN TUBES, HYSTERECTOMY AND SALPINGECTOMY:  - Foci of complex endometrial hyperplasia with focal atypia  - No evidence of invasive carcinoma  - Benign unremarkable cervix  - Benign unremarkable bilateral fallopian tubes   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 08/23/2021   08/23/21 Surgical pathology UTERUS, CERVIX, BILATERAL FALLOPIAN TUBES, HYSTERECTOMY AND SALPINGECTOMY:  - Foci of complex endometrial hyperplasia with focal atypia  - No evidence of invasive carcinoma  - Benign unremarkable cervix  - Benign unremarkable bilateral fallopian tubes   GERD (gastroesophageal reflux disease)    Helicobacter pylori antibody positive 2006   Tx w/ PPI, amoxicillin  and Biaxin   Hemorrhoids    History of gestational diabetes    yrs ago with 1st pregnancy   Hyperlipidemia    Hypertension    Obesity    PMB (postmenopausal bleeding) 08/15/2021   Pneumonia    as child   Postmenopausal bleeding 06/23/2021   Ultrasound showed 8.6 mm EM, 3 cm fibroid   Psoriasis 08/15/2021   PVC's (premature ventricular contractions)    occasional R/O cardiac via [facility] yrs ago   Rheumatoid arthritis (HCC)    Wears glasses    or  contacts   Past Surgical History:  Procedure Laterality Date   ABDOMINAL HYSTERECTOMY     CERVICAL POLYPECTOMY  12/22/2012   Procedure: CERVICAL POLYPECTOMY;  Surgeon: Hargis Paradise, MD;  Location: [facility];  Service: Gynecology;  Laterality: N/A;   CESAREAN SECTION     x 2 1999 and 2001   COLONOSCOPY     colonscopy and endoscopy  08/05/2020   HYSTEROSCOPY WITH D & C  12/22/2012   Procedure: DILATATION AND CURETTAGE /HYSTEROSCOPY;  Surgeon: Hargis Paradise, MD;  Location: [facility];  Service: Gynecology;;   LAPAROSCOPIC VAGINAL HYSTERECTOMY WITH SALPINGECTOMY Bilateral 08/23/2021   Procedure: LAPAROSCOPIC ASSISTED VAGINAL HYSTERECTOMY WITH BILATERAL SALPINGECTOMY;  Surgeon: Herchel Gloris LABOR, MD;  Location: [facility];  Service: Gynecology;  Laterality: Bilateral;   PLANTAR'S WART EXCISION     8 yrs ago per pt on 08-15-2021   Patient Active Problem List   Diagnosis Date Noted   Pain of left calf 10/06/2024   Obesity (BMI 30-39.9) 06/07/2023   Hepatic steatosis 06/07/2023   Hyperlipidemia 06/07/2023   COVID-19 04/16/2023   Wellness examination 01/01/2023   Rheumatoid arthritis involving multiple sites with positive rheumatoid factor (HCC) 09/22/2022   Sensorineural hearing loss (SNHL) of left ear with unrestricted hearing of right ear 09/22/2022   S/P laparoscopic assisted vaginal hysterectomy (LAVH) 08/23/2021   Hypertension 01/22/2012  Psoriasis 06/06/1969    PCP: de [place], Quintin PARAS, MD  REFERRING PROVIDER: de [place], Raymond J, MD  REFERRING DIAG:  Diagnosis  [phone] (ICD-10-CM) - Pain of left calf    THERAPY DIAG:  Pain of left calf  Other symptoms and signs involving the musculoskeletal system  Difficulty in walking, not elsewhere classified  Muscle weakness (generalized)  Rationale for Evaluation and Treatment: Rehabilitation  ONSET DATE: chronic   SUBJECTIVE:   SUBJECTIVE STATEMENT:  I think I'm having less pain and have been moving pretty  good. Tried to keep up with HEP as much as possible over the holidays. I'd put myself at 80-85% right now with main concerns being strength on that left side.    PERTINENT HISTORY: See above  PAIN:  Are you having pain? Yes: NPRS scale: 1/10 Pain location: L knee and ankle  Pain description: can be sharp but right now just aching  Aggravating factors: nothing  Relieving factors: nothing     PRECAUTIONS: None  RED FLAGS: None   WEIGHT BEARING RESTRICTIONS: No  FALLS:  Has patient fallen in last 6 months? 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    PATIENT SURVEYS:  PSFS: THE PATIENT SPECIFIC FUNCTIONAL SCALE  Place score of 0-10 (0 = unable to perform activity and 10 = able to perform activity at the same level as before injury or problem)  Activity Date: 11/10/24 Eval  12/08/24  Walking for extended distances  3 2.5  2.     3.    4.     Total Score 3 2.5    Total Score = Sum of activity scores/number of activities  Minimally Detectable Change: 3 points (for single activity); 2 points (for average score)  [facility] (nd). The Patient Specific Functional Scale . Retrieved from [email or web address]   COGNITION: Overall cognitive status: Within functional limits for tasks assessed     SENSATION: [facility]    MUSCLE LENGTH: L HS 50% impaired L piriformis OK   12/30-25- 20% limited HS B    PALPATION:  TTP mid-gastroc down achilles   LOWER  EXTREMITY ROM:  Active ROM Left eval 12/22/24 L 12/22/24 R  Hip flexion     Hip extension     Hip abduction     Hip adduction     Hip internal rotation     Hip external rotation     Knee flexion     Knee extension     Ankle dorsiflexion 18* 17* 20*  Ankle plantarflexion 60* 71* 70*  Ankle inversion 14* 20* 24*  Ankle eversion 13* 12* 15*   (Blank rows = not tested)  LOWER EXTREMITY MMT:  MMT Right eval Left eval R 12/08/24 L 12/08/24  Hip flexion 3+ 4- 3+ 3+  Hip extension      Hip abduction 5 4+ 4+ 3+  Hip adduction      Hip internal rotation      Hip external rotation      Knee flexion 3+ 3+ 4+ 4+  Knee extension 5 5 5  5  Ankle dorsiflexion 5 5    Ankle plantarflexion 3- at best standing test    2 at best standing test  4- at best standing test  3- at best standing test   Ankle inversion      Ankle eversion       (Blank rows = not tested)                                                                                                                                 TREATMENT DATE:    12/22/24  Ankle ROM, HS check, goals, POC   Nustep L2 x8 minutes seat 7, all four extremities   LAQs green TB x10 B Standing hip ABD green TB x10 B Bridges + ABD into green TB  x12 Sidelying hip ABD no resistance x10 B B heel raise + single leg lower down x10 B STS from high mat table (power up/controlled lower) x10     PATIENT EDUCATION:  Education details: exam findings, POC, HEP, prognosis with PT and methods used to address chronic pain, benefits of water PT if she is interested in context of RA   Person educated: Patient Education method: Explanation, Demonstration, and Handouts Education comprehension: verbalized understanding, returned demonstration, and needs further education  HOME EXERCISE PROGRAM:  Access Code: [email or web address] URL: [email or web address] Date: 12/22/2024 Prepared by: Josette Rough  ASSESSMENT:  CLINICAL IMPRESSION:   Arrives  today doing well, sounds like she is noticing considerable subjective progress, objectives taken today align with this as well.  Otherwise focused session on functional strengthening today as this is her main remaining concern. She will definitely benefit from extension of skilled PT services to continue addressing functional strength and working towards goals yet unmet.   OBJECTIVE IMPAIRMENTS: Abnormal gait, decreased balance, decreased coordination, decreased mobility, difficulty walking, decreased ROM, decreased strength, increased fascial restrictions, increased muscle spasms, impaired flexibility, obesity, and pain.   ACTIVITY LIMITATIONS: standing, squatting, stairs, and locomotion level  PARTICIPATION LIMITATIONS: meal prep, cleaning, laundry, shopping, community activity, and occupation  PERSONAL FACTORS: Age, Fitness, Past/current experiences, Profession, and Time since onset of injury/illness/exacerbation are also affecting patient's functional outcome.   REHAB POTENTIAL: Good  CLINICAL DECISION MAKING: Stable/uncomplicated  EVALUATION COMPLEXITY: Low   GOALS: Goals reviewed with patient? No  SHORT TERM GOALS: Target date: 01/12/2025     Will be compliant with appropriate progressive HEP  Baseline: Goal status: MET 12/22/24  2.  Sx to have improved by at least 25% Baseline:  Goal status: MET 12/22/24     LONG TERM GOALS: Target date: 02/02/2025        MMT to be 5/5 globally  Baseline:  Goal status: ONGOING 12/08/24   2.  Ankle AROM to be WNL all planes of motion  Baseline:  Goal status: PARTIALLY MET 12/22/24  3.  L HS flexibility to be no more than 25% limited  Baseline:  Goal status: MET 12/22/24  4.  Will  be able to ambulate for at least 30 minutes without pain  Baseline:  Goal status: ONGOING 12/22/24 its rare for me to walk that long, difficult to answer   5.  Will be able to perform all community and exercise activities without pain  Baseline:   Goal status: MET 12/22/24    PLAN:  PT FREQUENCY: 1x/week  PT DURATION: 6 weeks  PLANNED INTERVENTIONS: 97164- PT Re-evaluation, 97750- Physical Performance Testing, 97110-Therapeutic exercises, 97530- Therapeutic activity, [id number]- Neuromuscular re-education, 97535- Self Care, 02859- Manual therapy, [id number]- Gait training, [phone]- Aquatic Therapy, [phone]- Ultrasound, [phone]- Ionotophoresis 4mg /ml Dexamethasone , and Patient/Family education  PLAN FOR NEXT SESSION: general strength and flexibility, calf/ankle strengthening, monitor sx for signs of tendon injury, gait training. Increase PREs as tolerated.   Josette Rough, PT, DPT 12/22/2024 12:29 PM       "

## 2025-01-01 ENCOUNTER — Ambulatory Visit (HOSPITAL_BASED_OUTPATIENT_CLINIC_OR_DEPARTMENT_OTHER): Attending: Family Medicine | Admitting: Physical Therapy

## 2025-01-01 DIAGNOSIS — R262 Difficulty in walking, not elsewhere classified: Secondary | ICD-10-CM | POA: Diagnosis present

## 2025-01-01 DIAGNOSIS — M6281 Muscle weakness (generalized): Secondary | ICD-10-CM | POA: Insufficient documentation

## 2025-01-01 DIAGNOSIS — M79662 Pain in left lower leg: Secondary | ICD-10-CM | POA: Insufficient documentation

## 2025-01-01 DIAGNOSIS — R29898 Other symptoms and signs involving the musculoskeletal system: Secondary | ICD-10-CM | POA: Insufficient documentation

## 2025-01-01 NOTE — Therapy (Signed)
 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 08/23/2021   08/23/21 Surgical pathology UTERUS, CERVIX, BILATERAL FALLOPIAN TUBES, HYSTERECTOMY AND SALPINGECTOMY:  - Foci of complex endometrial hyperplasia with focal atypia  - No evidence of invasive carcinoma  - Benign unremarkable cervix  - Benign unremarkable bilateral fallopian tubes   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 08/23/2021   08/23/21 Surgical pathology UTERUS, CERVIX, BILATERAL FALLOPIAN TUBES, HYSTERECTOMY AND SALPINGECTOMY:  - Foci of complex endometrial hyperplasia with focal atypia  - No evidence of invasive carcinoma  - Benign unremarkable cervix  - Benign unremarkable bilateral fallopian tubes   GERD (gastroesophageal reflux disease)    Helicobacter pylori antibody positive 2006   Tx w/ PPI, amoxicillin  and Biaxin   Hemorrhoids    History of gestational diabetes    yrs ago with 1st pregnancy   Hyperlipidemia    Hypertension    Obesity    PMB (postmenopausal bleeding) 08/15/2021   Pneumonia    as child   Postmenopausal bleeding 06/23/2021   Ultrasound showed 8.6 mm EM, 3 cm fibroid   Psoriasis 08/15/2021   PVC's (premature ventricular contractions)    occasional R/O cardiac via [facility] yrs ago   Rheumatoid arthritis (HCC)    Wears glasses    or contacts   Past Surgical History:  Procedure Laterality Date   ABDOMINAL HYSTERECTOMY     CERVICAL POLYPECTOMY  12/22/2012   Procedure: CERVICAL POLYPECTOMY;  Surgeon: Hargis Paradise, MD;  Location: [facility];  Service: Gynecology;  Laterality: N/A;   CESAREAN SECTION     x 2 1999 and 2001   COLONOSCOPY     colonscopy and endoscopy  08/05/2020    HYSTEROSCOPY WITH D & C  12/22/2012   Procedure: DILATATION AND CURETTAGE /HYSTEROSCOPY;  Surgeon: Hargis Paradise, MD;  Location: [facility];  Service: Gynecology;;   LAPAROSCOPIC VAGINAL HYSTERECTOMY WITH SALPINGECTOMY Bilateral 08/23/2021   Procedure: LAPAROSCOPIC ASSISTED VAGINAL HYSTERECTOMY WITH BILATERAL SALPINGECTOMY;  Surgeon: Herchel Gloris LABOR, MD;  Location: [facility];  Service: Gynecology;  Laterality: Bilateral;   PLANTAR'S WART EXCISION     8 yrs ago per pt on 08-15-2021   Patient Active Problem List   Diagnosis Date Noted   Pain of left calf 10/06/2024   Obesity (BMI 30-39.9) 06/07/2023   Hepatic steatosis 06/07/2023   Hyperlipidemia 06/07/2023   COVID-19 04/16/2023   Wellness examination 01/01/2023   Rheumatoid arthritis involving multiple sites with positive rheumatoid factor (HCC) 09/22/2022   Sensorineural hearing loss (SNHL) of left ear with unrestricted hearing of right ear 09/22/2022   S/P laparoscopic assisted vaginal hysterectomy (LAVH) 08/23/2021   Hypertension 01/22/2012   Psoriasis 06/06/1969    PCP: de Cuba, Raymond J, MD  REFERRING PROVIDER: de [place], Raymond J, MD  REFERRING DIAG:  Diagnosis  [phone] (ICD-10-CM) - Pain of left calf    THERAPY DIAG:  No diagnosis found.  Rationale for Evaluation and Treatment: Rehabilitation  ONSET DATE: chronic   SUBJECTIVE:   SUBJECTIVE STATEMENT:  Pt denies any adverse effects after prior treatment.  Pt has more pain at night.  Her hips bother her at night.  I have  some level of discomfort all the time.  Pt states she protects her L knee a lot. Pt denies pain currently.  Pt reports at least a 50% improvement in pain and overall.  Pt states it's hard for her to give a percentage improvement due to her RA.       I think I'm having less pain and have been moving pretty good. Tried to keep up with HEP as much as possible over the holidays. I'd put myself at 80-85% right now with main concerns being  strength on that left side.    PERTINENT HISTORY: See above  PAIN:  Are you having pain? Yes: NPRS scale: 1/10 Pain location: L knee and ankle  Pain description: can be sharp but right now just aching  Aggravating factors: nothing  Relieving factors: nothing     PRECAUTIONS: None  RED FLAGS: None   WEIGHT BEARING RESTRICTIONS: No  FALLS:  Has patient fallen in last 6 months? 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    PATIENT SURVEYS:  PSFS: THE PATIENT SPECIFIC FUNCTIONAL SCALE  Place score of 0-10 (0 = unable to perform activity and 10 = able to perform activity at the same level as before injury or problem)  Activity Date: 11/10/24 Eval  12/08/24  Walking for extended distances  3 2.5  2.     3.    4.     Total Score 3 2.5    Total Score = Sum of activity scores/number of activities  Minimally Detectable Change: 3 points (for single activity); 2 points (for average score)  [facility] (nd). The Patient Specific Functional Scale . Retrieved from [email or web address]   COGNITION: Overall cognitive status: Within functional limits for tasks assessed     SENSATION: [facility]    MUSCLE LENGTH: L HS 50% impaired L piriformis OK   12/30-25- 20% limited HS B    PALPATION:  TTP mid-gastroc down achilles   LOWER EXTREMITY ROM:  Active ROM Left eval 12/22/24 L 12/22/24 R  Hip flexion     Hip extension     Hip abduction     Hip adduction      Hip internal rotation     Hip external rotation     Knee flexion     Knee extension     Ankle dorsiflexion 18* 17* 20*  Ankle plantarflexion 60* 71* 70*  Ankle inversion 14* 20* 24*  Ankle eversion 13* 12* 15*   (Blank rows = not tested)  LOWER EXTREMITY MMT:  MMT Right eval Left eval R 12/08/24 L 12/08/24  Hip flexion 3+ 4- 3+ 3+  Hip extension      Hip abduction 5 4+ 4+ 3+  Hip adduction      Hip internal rotation      Hip external rotation      Knee flexion 3+ 3+ 4+ 4+  Knee extension 5 5 5 5   Ankle dorsiflexion 5 5    Ankle plantarflexion 3- at best standing test    2 at best standing test  4- at  best standing test  3- at best standing test   Ankle inversion      Ankle eversion       (Blank rows = not tested)                                                                                                                                 TREATMENT DATE:   1/9  Nustep L2-4 x5 minutes UE/LE's  Supine bridge with GTB around LE's 2x12 S/L clams with RTB 2x10 bilat B heel raise + single leg lower down 2x10  Standing hip abd with GTB 2x10 bilat LAQ green TB 2x10 B  STM---    PATIENT EDUCATION:  Education details: exam findings, POC, HEP, prognosis with PT and methods used to address chronic pain, benefits of water PT if she is interested in context of RA   Person educated: Patient Education method: Explanation, Demonstration, and Handouts Education comprehension: verbalized understanding, returned demonstration, and needs further education  HOME EXERCISE PROGRAM:  Access Code: [email or web address] URL: [email or web address] Date: 12/22/2024 Prepared by: Josette Rough  ASSESSMENT:  CLINICAL IMPRESSION:  Arrives today doing well, sounds like she is noticing considerable subjective progress, objectives taken today align with this as well.  Otherwise focused session on functional strengthening today as this is her main remaining concern. She will definitely  benefit from extension of skilled PT services to continue addressing functional strength and working towards goals yet unmet.   Feels good, no pain  OBJECTIVE IMPAIRMENTS: Abnormal gait, decreased balance, decreased coordination, decreased mobility, difficulty walking, decreased ROM, decreased strength, increased fascial restrictions, increased muscle spasms, impaired flexibility, obesity, and pain.   ACTIVITY LIMITATIONS: standing, squatting, stairs, and locomotion level  PARTICIPATION LIMITATIONS: meal prep, cleaning, laundry, shopping, community activity, and occupation  PERSONAL FACTORS: Age, Fitness, Past/current experiences, Profession, and Time since onset of injury/illness/exacerbation are also affecting patient's functional outcome.   REHAB POTENTIAL: Good  CLINICAL DECISION MAKING: Stable/uncomplicated  EVALUATION COMPLEXITY: Low   GOALS: Goals reviewed with patient? No  SHORT TERM GOALS: Target date: 01/12/2025     Will be compliant with appropriate progressive HEP  Baseline: Goal status: MET 12/22/24  2.  Sx to have improved by at least 25% Baseline:  Goal status: MET 12/22/24     LONG TERM GOALS: Target date: 02/02/2025        MMT to be 5/5 globally  Baseline:  Goal status: ONGOING 12/08/24   2.  Ankle AROM to be WNL all planes of motion  Baseline:  Goal status: PARTIALLY MET 12/22/24  3.  L HS flexibility to be no more than 25% limited  Baseline:  Goal status: MET 12/22/24  4.  Will be able to ambulate for at least 30 minutes without pain  Baseline:  Goal status: ONGOING 12/22/24 its rare for me to walk that long, difficult to answer   5.  Will be able to perform all community and exercise activities without pain  Baseline:  Goal status: MET 12/22/24    PLAN:  PT FREQUENCY: 1x/week  PT DURATION: 6 weeks  PLANNED INTERVENTIONS: 97164- PT Re-evaluation, 97750- Physical Performance Testing, 97110-Therapeutic exercises, 97530- Therapeutic  activity, [id number]- Neuromuscular re-education, 97535- Self Care, [id number]- Manual therapy, [id number]- Gait training, [phone]- Aquatic Therapy, [phone]- Ultrasound, [phone]- Ionotophoresis 4mg /ml Dexamethasone , and Patient/Family education  PLAN FOR NEXT SESSION: general strength and flexibility, calf/ankle strengthening, monitor sx for signs of tendon injury, gait training. Increase PREs as tolerated.   Josette Rough, PT, DPT 01/01/2025 10:09 AM       "

## 2025-01-02 ENCOUNTER — Encounter (HOSPITAL_BASED_OUTPATIENT_CLINIC_OR_DEPARTMENT_OTHER): Payer: Self-pay | Admitting: Physical Therapy

## 2025-01-05 ENCOUNTER — Ambulatory Visit (HOSPITAL_BASED_OUTPATIENT_CLINIC_OR_DEPARTMENT_OTHER): Admitting: Physical Therapy

## 2025-01-05 ENCOUNTER — Encounter (HOSPITAL_BASED_OUTPATIENT_CLINIC_OR_DEPARTMENT_OTHER): Payer: Self-pay | Admitting: Physical Therapy

## 2025-01-05 DIAGNOSIS — M6281 Muscle weakness (generalized): Secondary | ICD-10-CM

## 2025-01-05 DIAGNOSIS — R29898 Other symptoms and signs involving the musculoskeletal system: Secondary | ICD-10-CM

## 2025-01-05 DIAGNOSIS — R262 Difficulty in walking, not elsewhere classified: Secondary | ICD-10-CM

## 2025-01-05 DIAGNOSIS — M79662 Pain in left lower leg: Secondary | ICD-10-CM | POA: Diagnosis not present

## 2025-01-05 NOTE — Therapy (Signed)
 " OUTPATIENT PHYSICAL THERAPY LOWER EXTREMITY TREATMENT   Patient Name: [name] MRN: [id number] DOB:01/12/1961, 64 y.o., female Today's Date: 01/06/2025  END OF SESSION:  PT End of Session - 01/05/25 0815     Visit Number 8    Number of Visits 12    Date for Recertification  02/02/25    Authorization Type [facility] [employer]    PT Start Time 0806    PT Stop Time 0847    PT Time Calculation (min) 41 min    Activity Tolerance Patient tolerated treatment well    Behavior During Therapy [facility] for tasks assessed/performed               Past Medical History:  Diagnosis Date   Allergy    Arthritis    rheumatoid arthritis   Asthma    as a child    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 08/23/2021   08/23/21 Surgical pathology UTERUS, CERVIX, BILATERAL FALLOPIAN TUBES, HYSTERECTOMY AND SALPINGECTOMY:  - Foci of complex endometrial hyperplasia with focal atypia  - No evidence of invasive carcinoma  - Benign unremarkable cervix  - Benign unremarkable bilateral fallopian tubes   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 08/23/2021   08/23/21 Surgical pathology UTERUS, CERVIX, BILATERAL FALLOPIAN TUBES, HYSTERECTOMY AND SALPINGECTOMY:  - Foci of complex endometrial hyperplasia with focal atypia  - No evidence of invasive carcinoma  - Benign unremarkable cervix  - Benign unremarkable bilateral fallopian tubes   GERD (gastroesophageal reflux disease)    Helicobacter pylori antibody positive 2006   Tx w/ PPI, amoxicillin  and Biaxin   Hemorrhoids    History of gestational diabetes    yrs ago with 1st pregnancy   Hyperlipidemia    Hypertension    Obesity    PMB (postmenopausal bleeding) 08/15/2021   Pneumonia    as child   Postmenopausal bleeding 06/23/2021   Ultrasound showed 8.6 mm EM, 3 cm fibroid   Psoriasis 08/15/2021   PVC's (premature ventricular contractions)    occasional R/O cardiac via [facility] yrs ago   Rheumatoid arthritis (HCC)    Wears glasses     or contacts   Past Surgical History:  Procedure Laterality Date   ABDOMINAL HYSTERECTOMY     CERVICAL POLYPECTOMY  12/22/2012   Procedure: CERVICAL POLYPECTOMY;  Surgeon: Hargis Paradise, MD;  Location: [facility];  Service: Gynecology;  Laterality: N/A;   CESAREAN SECTION     x 2 1999 and 2001   COLONOSCOPY     colonscopy and endoscopy  08/05/2020   HYSTEROSCOPY WITH D & C  12/22/2012   Procedure: DILATATION AND CURETTAGE /HYSTEROSCOPY;  Surgeon: Hargis Paradise, MD;  Location: [facility];  Service: Gynecology;;   LAPAROSCOPIC VAGINAL HYSTERECTOMY WITH SALPINGECTOMY Bilateral 08/23/2021   Procedure: LAPAROSCOPIC ASSISTED VAGINAL HYSTERECTOMY WITH BILATERAL SALPINGECTOMY;  Surgeon: Herchel Gloris LABOR, MD;  Location: [facility];  Service: Gynecology;  Laterality: Bilateral;   PLANTAR'S WART EXCISION     8 yrs ago per pt on 08-15-2021   Patient Active Problem List   Diagnosis Date Noted   Pain of left calf 10/06/2024   Obesity (BMI 30-39.9) 06/07/2023   Hepatic steatosis 06/07/2023   Hyperlipidemia 06/07/2023   COVID-19 04/16/2023   Wellness examination 01/01/2023   Rheumatoid arthritis involving multiple sites with positive rheumatoid factor (HCC) 09/22/2022   Sensorineural hearing loss (SNHL) of left ear with unrestricted hearing of right ear 09/22/2022   S/P laparoscopic assisted vaginal hysterectomy (LAVH) 08/23/2021   Hypertension 01/22/2012  Psoriasis 06/06/1969    PCP: de [place], Quintin PARAS, MD  REFERRING PROVIDER: de [place], Raymond J, MD  REFERRING DIAG:  Diagnosis  [phone] (ICD-10-CM) - Pain of left calf    THERAPY DIAG:  Pain of left calf  Other symptoms and signs involving the musculoskeletal system  Difficulty in walking, not elsewhere classified  Muscle weakness (generalized)  Rationale for Evaluation and Treatment: Rehabilitation  ONSET DATE: chronic   SUBJECTIVE:   SUBJECTIVE STATEMENT:  Pt denies any adverse effects after prior treatment.    Pt drove her car for 4 hours though stopped twice and got out of her car.  Pt reports having a cramp in R hip with sitting > 30 mins.      PERTINENT HISTORY: RA See above  PAIN:  Are you having pain? Yes: NPRS scale: 2/10 Pain location: L knee and calf  Pain description: can be sharp but right now just aching  Aggravating factors: nothing  Relieving factors: nothing     PRECAUTIONS: None  RED FLAGS: None   WEIGHT BEARING RESTRICTIONS: No  FALLS:  Has patient fallen in last 6 months? 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    PATIENT SURVEYS:  PSFS: THE PATIENT SPECIFIC FUNCTIONAL SCALE  Place score of 0-10 (0 = unable to perform activity and 10 = able to perform activity at the same level as before injury or problem)  Activity Date: 11/10/24 Eval  12/08/24  Walking for extended distances  3 2.5  2.     3.    4.     Total Score 3 2.5    Total Score = Sum of activity scores/number of activities  Minimally Detectable Change: 3 points (for single activity); 2 points (for average score)  [facility] (nd). The Patient Specific Functional Scale . Retrieved from [email or web address]   COGNITION: Overall cognitive status: Within functional limits for tasks assessed     SENSATION: [facility]    MUSCLE LENGTH: L HS 50% impaired L piriformis OK   12/30-25- 20% limited HS B    PALPATION:  TTP mid-gastroc down achilles   LOWER EXTREMITY  ROM:  Active ROM Left eval 12/22/24 L 12/22/24 R  Hip flexion     Hip extension     Hip abduction     Hip adduction     Hip internal rotation     Hip external rotation     Knee flexion     Knee extension     Ankle dorsiflexion 18* 17* 20*  Ankle plantarflexion 60* 71* 70*  Ankle inversion 14* 20* 24*  Ankle eversion 13* 12* 15*   (Blank rows = not tested)  LOWER EXTREMITY MMT:  MMT Right eval Left eval R 12/08/24 L 12/08/24  Hip flexion 3+ 4- 3+ 3+  Hip extension      Hip abduction 5 4+ 4+ 3+  Hip adduction      Hip internal rotation      Hip external rotation      Knee flexion 3+ 3+ 4+ 4+  Knee extension 5 5  5 5  Ankle dorsiflexion 5 5    Ankle plantarflexion 3- at best standing test    2 at best standing test  4- at best standing test  3- at best standing test   Ankle inversion      Ankle eversion       (Blank rows = not tested)                                                                                                                                 TREATMENT DATE:   1/13  Nustep L2-4 x6 minutes UE/LE's Seated BAPS 2x10 cw, ccw, and f/b  SL  Supine bridge with GTB around LE's 2x10 Standing hip abd with GTB 2x10 bilat Marching on airex with UE support  B heel raise + single leg lower down x10, x 5 reps   Pt received STM to L calf and used the roller to L calf in prone    PATIENT EDUCATION:  Education details:  POC, HEP, exercise form, and rationale of interventions   Person educated: Patient Education method: Explanation, Demonstration, and Handouts Education comprehension: verbalized understanding, returned demonstration, and needs further education  HOME EXERCISE PROGRAM:  Access Code: [email or web address] URL: [email or web address] Date: 12/22/2024 Prepared by: Josette Rough  ASSESSMENT:  CLINICAL IMPRESSION:    Pt performed exercises to improve pain, hip and calf strength, ankle mobility, and proprioception.  Pt did have some L  knee pain with exercises.  She reports having a little pain in L knee with marching on airex; PT didn't have pt perform a 2nd set of airex marching.  Pt also stopped on 2nd set of heel raises due to L knee pain.  Pt has soft tissue tightness in calf.  PT performed STM/TPR to improve soft tissue tightness and mobility and pain and to reduce myofasical restrictions.  Pt responded well to treatment reporting improved pain to 1/10 in calf and 0/10 in knee after treatment.    OBJECTIVE IMPAIRMENTS: Abnormal gait, decreased balance, decreased coordination, decreased mobility, difficulty walking, decreased ROM, decreased strength, increased fascial restrictions, increased muscle spasms, impaired flexibility, obesity, and pain.   ACTIVITY LIMITATIONS: standing, squatting, stairs, and locomotion level  PARTICIPATION LIMITATIONS: meal prep, cleaning, laundry, shopping, community activity, and occupation  PERSONAL FACTORS: Age, Fitness, Past/current experiences, Profession, and Time since onset of injury/illness/exacerbation are also affecting patient's functional outcome.   REHAB POTENTIAL: Good  CLINICAL DECISION MAKING: Stable/uncomplicated  EVALUATION COMPLEXITY: Low   GOALS: Goals reviewed with patient? No  SHORT TERM GOALS: Target date: 01/12/2025     Will be compliant with appropriate progressive HEP  Baseline: Goal status: MET 12/22/24  2.  Sx to have improved by at least 25% Baseline:  Goal status: MET 12/22/24     LONG TERM GOALS: Target date: 02/02/2025        MMT to be 5/5 globally  Baseline:  Goal status: ONGOING 12/08/24   2.  Ankle AROM  to be WNL all planes of motion  Baseline:  Goal status: PARTIALLY MET 12/22/24  3.  L HS flexibility to be no more than 25% limited  Baseline:  Goal status: MET 12/22/24  4.  Will be able to ambulate for at least 30 minutes without pain  Baseline:  Goal status: ONGOING 12/22/24 its rare for me to walk that long, difficult to  answer   5.  Will be able to perform all community and exercise activities without pain  Baseline:  Goal status: MET 12/22/24    PLAN:  PT FREQUENCY: 1x/week  PT DURATION: 6 weeks  PLANNED INTERVENTIONS: 97164- PT Re-evaluation, 97750- Physical Performance Testing, 97110-Therapeutic exercises, 97530- Therapeutic activity, [id number]- Neuromuscular re-education, 97535- Self Care, 02859- Manual therapy, [id number]- Gait training, [phone]- Aquatic Therapy, [phone]- Ultrasound, 02966- Ionotophoresis 4mg /ml Dexamethasone , and Patient/Family education  PLAN FOR NEXT SESSION: general strength and flexibility, calf/ankle strengthening, monitor sx for signs of tendon injury, gait training. Increase PREs as tolerated.   Leigh Minerva III PT, DPT 01/06/2025 1:30 PM         "

## 2025-01-06 ENCOUNTER — Other Ambulatory Visit: Payer: Self-pay

## 2025-01-08 ENCOUNTER — Other Ambulatory Visit (HOSPITAL_COMMUNITY): Payer: Self-pay

## 2025-01-08 ENCOUNTER — Encounter: Payer: Self-pay | Admitting: Pharmacist

## 2025-01-08 ENCOUNTER — Other Ambulatory Visit: Payer: Self-pay

## 2025-01-08 NOTE — Progress Notes (Signed)
 Specialty Pharmacy Ongoing Clinical Assessment Note  [name] is a 64 y.o. female who is being followed by the specialty pharmacy service for RxSp Rheumatoid Arthritis   Patient's specialty medication(s) reviewed today: Adalimumab  (Humira  (2 Pen))   Missed doses in the last 4 weeks: 0   Patient/Caregiver did not have any additional questions or concerns.   Therapeutic benefit summary: Patient is achieving benefit   Adverse events/side effects summary: No adverse events/side effects   Patient's therapy is appropriate to: Continue    Goals Addressed             This Visit's Progress    Minimize recurrence of flares   On track    Patient is on track. Patient will maintain adherence. Patient reported some ongoing issues with muscle/joint pain but thinks it was more from statin use and not RA. She has been attending physical therapy and that has resolved most of the pain.          Follow up: 12 months  [facility]

## 2025-01-10 ENCOUNTER — Other Ambulatory Visit (HOSPITAL_BASED_OUTPATIENT_CLINIC_OR_DEPARTMENT_OTHER): Payer: Self-pay | Admitting: Family Medicine

## 2025-01-10 ENCOUNTER — Other Ambulatory Visit (HOSPITAL_COMMUNITY): Payer: Self-pay

## 2025-01-11 ENCOUNTER — Other Ambulatory Visit: Payer: Self-pay

## 2025-01-11 ENCOUNTER — Other Ambulatory Visit (HOSPITAL_COMMUNITY): Payer: Self-pay

## 2025-01-11 MED ORDER — FLUTICASONE PROPIONATE 50 MCG/ACT NA SUSP
1.0000 | Freq: Every day | NASAL | 2 refills | Status: AC
  Filled 2025-01-11: qty 16, 60d supply, fill #0

## 2025-01-12 ENCOUNTER — Other Ambulatory Visit (HOSPITAL_COMMUNITY): Payer: Self-pay

## 2025-01-12 ENCOUNTER — Other Ambulatory Visit: Payer: Self-pay

## 2025-01-12 ENCOUNTER — Ambulatory Visit (HOSPITAL_BASED_OUTPATIENT_CLINIC_OR_DEPARTMENT_OTHER): Admitting: Physical Therapy

## 2025-01-12 ENCOUNTER — Encounter (HOSPITAL_BASED_OUTPATIENT_CLINIC_OR_DEPARTMENT_OTHER): Payer: Self-pay | Admitting: Physical Therapy

## 2025-01-12 DIAGNOSIS — M79662 Pain in left lower leg: Secondary | ICD-10-CM | POA: Diagnosis not present

## 2025-01-12 DIAGNOSIS — R262 Difficulty in walking, not elsewhere classified: Secondary | ICD-10-CM

## 2025-01-12 DIAGNOSIS — R29898 Other symptoms and signs involving the musculoskeletal system: Secondary | ICD-10-CM

## 2025-01-12 DIAGNOSIS — M6281 Muscle weakness (generalized): Secondary | ICD-10-CM

## 2025-01-12 MED ORDER — METHOTREXATE SODIUM 2.5 MG PO TABS
ORAL_TABLET | ORAL | 0 refills | Status: AC
  Filled 2025-01-12: qty 96, 84d supply, fill #0

## 2025-01-12 NOTE — Progress Notes (Signed)
 Specialty Pharmacy Refill Coordination Note  [name] is a 64 y.o. female contacted today regarding refills of specialty medication(s) Adalimumab  (Humira  (2 Pen))   Patient requested (Patient-Rptd) Delivery   Delivery date: 01/20/25   Verified address: (Patient-Rptd) [address] [place], [place] [address]   Medication will be filled on: 01/19/25

## 2025-01-12 NOTE — Therapy (Signed)
 " OUTPATIENT PHYSICAL THERAPY LOWER EXTREMITY TREATMENT   Patient Name: [name] MRN: [id number] DOB:June 13, 1961, 64 y.o., female Today's Date: 01/13/2025  END OF SESSION:  PT End of Session - 01/12/25 1336     Visit Number 9    Number of Visits 12    Date for Recertification  02/02/25    Authorization Type [facility] [employer]    PT Start Time 1323    PT Stop Time 1359    PT Time Calculation (min) 36 min    Activity Tolerance Patient tolerated treatment well    Behavior During Therapy [facility] for tasks assessed/performed                Past Medical History:  Diagnosis Date   Allergy    Arthritis    rheumatoid arthritis   Asthma    as a child    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 08/23/2021   08/23/21 Surgical pathology UTERUS, CERVIX, BILATERAL FALLOPIAN TUBES, HYSTERECTOMY AND SALPINGECTOMY:  - Foci of complex endometrial hyperplasia with focal atypia  - No evidence of invasive carcinoma  - Benign unremarkable cervix  - Benign unremarkable bilateral fallopian tubes   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 08/23/2021   08/23/21 Surgical pathology UTERUS, CERVIX, BILATERAL FALLOPIAN TUBES, HYSTERECTOMY AND SALPINGECTOMY:  - Foci of complex endometrial hyperplasia with focal atypia  - No evidence of invasive carcinoma  - Benign unremarkable cervix  - Benign unremarkable bilateral fallopian tubes   GERD (gastroesophageal reflux disease)    Helicobacter pylori antibody positive 2006   Tx w/ PPI, amoxicillin  and Biaxin   Hemorrhoids    History of gestational diabetes    yrs ago with 1st pregnancy   Hyperlipidemia    Hypertension    Obesity    PMB (postmenopausal bleeding) 08/15/2021   Pneumonia    as child   Postmenopausal bleeding 06/23/2021   Ultrasound showed 8.6 mm EM, 3 cm fibroid   Psoriasis 08/15/2021   PVC's (premature ventricular contractions)    occasional R/O cardiac via [place] yrs ago   Rheumatoid arthritis (HCC)    Wears glasses     or contacts   Past Surgical History:  Procedure Laterality Date   ABDOMINAL HYSTERECTOMY     CERVICAL POLYPECTOMY  12/22/2012   Procedure: CERVICAL POLYPECTOMY;  Surgeon: Hargis Paradise, MD;  Location: [facility];  Service: Gynecology;  Laterality: N/A;   CESAREAN SECTION     x 2 1999 and 2001   COLONOSCOPY     colonscopy and endoscopy  08/05/2020   HYSTEROSCOPY WITH D & C  12/22/2012   Procedure: DILATATION AND CURETTAGE /HYSTEROSCOPY;  Surgeon: Hargis Paradise, MD;  Location: [facility];  Service: Gynecology;;   LAPAROSCOPIC VAGINAL HYSTERECTOMY WITH SALPINGECTOMY Bilateral 08/23/2021   Procedure: LAPAROSCOPIC ASSISTED VAGINAL HYSTERECTOMY WITH BILATERAL SALPINGECTOMY;  Surgeon: Herchel Gloris LABOR, MD;  Location: [facility];  Service: Gynecology;  Laterality: Bilateral;   PLANTAR'S WART EXCISION     8 yrs ago per pt on 08-15-2021   Patient Active Problem List   Diagnosis Date Noted   Pain of left calf 10/06/2024   Obesity (BMI 30-39.9) 06/07/2023   Hepatic steatosis 06/07/2023   Hyperlipidemia 06/07/2023   COVID-19 04/16/2023   Wellness examination 01/01/2023   Rheumatoid arthritis involving multiple sites with positive rheumatoid factor (HCC) 09/22/2022   Sensorineural hearing loss (SNHL) of left ear with unrestricted hearing of right ear 09/22/2022   S/P laparoscopic assisted vaginal hysterectomy (LAVH) 08/23/2021   Hypertension  01/22/2012   Psoriasis 06/06/1969    PCP: de [place], Raymond J, MD  REFERRING PROVIDER: de [place], Raymond J, MD  REFERRING DIAG:  Diagnosis  [phone] (ICD-10-CM) - Pain of left calf    THERAPY DIAG:  Pain of left calf  Other symptoms and signs involving the musculoskeletal system  Difficulty in walking, not elsewhere classified  Muscle weakness (generalized)  Rationale for Evaluation and Treatment: Rehabilitation  ONSET DATE: chronic   SUBJECTIVE:   SUBJECTIVE STATEMENT:  Pt has hip pain if driving > 20 mins which improves  with massage and changing positions.  Hip pain wakes her up at night which has been going on awhile.  Pt also has stiffness in knees though her hip bothers her more than her knees.  Pt states she felt good after prior treatment.     Pt drove her car for 4 hours though stopped twice and got out of her car.  Pt reports having a cramp in R hip with sitting > 30 mins.      PERTINENT HISTORY: RA See above  PAIN:  Are you having pain? Yes: NPRS scale: 0/10 Pain location: L knee and calf  Pain description: can be sharp but right now just aching  Aggravating factors: nothing  Relieving factors: nothing     PRECAUTIONS: None  RED FLAGS: None   WEIGHT BEARING RESTRICTIONS: No  FALLS:  Has patient fallen in last 6 months? 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    PATIENT SURVEYS:  PSFS: THE PATIENT SPECIFIC FUNCTIONAL SCALE  Place score of 0-10 (0 = unable to perform activity and 10 = able to perform activity at the same level as before injury or problem)  Activity Date: 11/10/24 Eval  12/08/24  Walking for extended distances  3 2.5  2.     3.    4.     Total Score 3 2.5    Total Score = Sum of activity scores/number of activities  Minimally Detectable Change: 3 points (for single activity); 2 points (for average score)  [facility] (nd). The Patient Specific Functional Scale . Retrieved from [email or web address]   COGNITION: Overall cognitive status:  Within functional limits for tasks assessed     SENSATION: [facility]    MUSCLE LENGTH: L HS 50% impaired L piriformis OK   12/30-25- 20% limited HS B    PALPATION:  TTP mid-gastroc down achilles   LOWER EXTREMITY ROM:  Active ROM Left eval 12/22/24 L 12/22/24 R  Hip flexion     Hip extension     Hip abduction     Hip adduction     Hip internal rotation     Hip external rotation     Knee flexion     Knee extension     Ankle dorsiflexion 18* 17* 20*  Ankle plantarflexion 60* 71* 70*  Ankle inversion 14* 20* 24*  Ankle eversion 13* 12* 15*   (Blank rows = not tested)  LOWER EXTREMITY MMT:  MMT Right eval Left eval R 12/08/24 L 12/08/24  Hip flexion 3+  4- 3+ 3+  Hip extension      Hip abduction 5 4+ 4+ 3+  Hip adduction      Hip internal rotation      Hip external rotation      Knee flexion 3+ 3+ 4+ 4+  Knee extension 5 5 5 5   Ankle dorsiflexion 5 5    Ankle plantarflexion 3- at best standing test    2 at best standing test  4- at best standing test  3- at best standing test   Ankle inversion      Ankle eversion       (Blank rows = not tested)                                                                                                                                 TREATMENT DATE:   1/20  Nustep L4 x6 minutes UE/LE's Seated BAPS 2x10 cw, ccw, and f/b  SL  Supine bridge with GTB around LE's 3x10 Marching on airex with UE support 2x10 B heel raise + single leg lower down 2x10 reps  Sidestepping with bilat UE support on rail Standing gastroc stretch on wall 2x20-30 sec  Pt received STM to L calf in prone   (Pt had to leave for another appointment.     PATIENT EDUCATION:  Education details:  POC, HEP, exercise form, and rationale of interventions   Person educated: Patient Education method: Explanation, Demonstration, and Handouts Education comprehension: verbalized understanding, returned demonstration, and needs further education  HOME EXERCISE  PROGRAM:  Access Code: [email or web address] URL: [email or web address] Date: 12/22/2024 Prepared by: Josette Rough  ASSESSMENT:  CLINICAL IMPRESSION:    Pt performed exercises to improve pain, hip and calf strength, ankle mobility, and proprioception.  Pt did have some L knee pain with exercises.  She reports having a little pain in L knee with marching on airex; PT didn't have pt perform a 2nd set of airex marching.  Pt also stopped on 2nd set of heel raises due to L knee pain.  Pt has soft tissue tightness in calf.  PT performed STM/TPR to improve soft tissue tightness and mobility and pain and to reduce myofasical restrictions.  Pt responded well to treatment reporting improved pain to 1/10 in calf and 0/10 in knee after treatment.  Little pain in calf after treatment, tender to STM to calf    OBJECTIVE IMPAIRMENTS: Abnormal gait, decreased balance, decreased coordination, decreased mobility, difficulty walking, decreased ROM, decreased strength, increased fascial restrictions, increased muscle spasms, impaired flexibility, obesity, and pain.   ACTIVITY LIMITATIONS: standing, squatting, stairs, and locomotion level  PARTICIPATION LIMITATIONS: meal prep, cleaning, laundry, shopping, community activity, and occupation  PERSONAL FACTORS: Age, Fitness, Past/current experiences, Profession, and Time since onset of injury/illness/exacerbation are also affecting patient's functional outcome.   REHAB POTENTIAL: Good  CLINICAL DECISION MAKING: Stable/uncomplicated  EVALUATION COMPLEXITY: Low   GOALS: Goals reviewed with patient? No  SHORT TERM GOALS:  Target date: 01/12/2025     Will be compliant with appropriate progressive HEP  Baseline: Goal status: MET 12/22/24  2.  Sx to have improved by at least 25% Baseline:  Goal status: MET 12/22/24     LONG TERM GOALS: Target date: 02/02/2025        MMT to be 5/5 globally  Baseline:  Goal status: ONGOING 12/08/24   2.   Ankle AROM to be WNL all planes of motion  Baseline:  Goal status: PARTIALLY MET 12/22/24  3.  L HS flexibility to be no more than 25% limited  Baseline:  Goal status: MET 12/22/24  4.  Will be able to ambulate for at least 30 minutes without pain  Baseline:  Goal status: ONGOING 12/22/24 its rare for me to walk that long, difficult to answer   5.  Will be able to perform all community and exercise activities without pain  Baseline:  Goal status: MET 12/22/24    PLAN:  PT FREQUENCY: 1x/week  PT DURATION: 6 weeks  PLANNED INTERVENTIONS: 97164- PT Re-evaluation, 97750- Physical Performance Testing, 97110-Therapeutic exercises, 97530- Therapeutic activity, [id number]- Neuromuscular re-education, 97535- Self Care, [id number]- Manual therapy, [id number]- Gait training, [id number]- Aquatic Therapy, [phone]- Ultrasound, 02966- Ionotophoresis 4mg /ml Dexamethasone , and Patient/Family education  PLAN FOR NEXT SESSION: general strength and flexibility, calf/ankle strengthening, monitor sx for signs of tendon injury, gait training. Increase PREs as tolerated.   Leigh Minerva III PT, DPT 01/13/25 8:38 PM         "

## 2025-01-13 ENCOUNTER — Other Ambulatory Visit: Payer: Self-pay

## 2025-01-13 ENCOUNTER — Other Ambulatory Visit (HOSPITAL_COMMUNITY): Payer: Self-pay

## 2025-01-13 NOTE — Progress Notes (Signed)
 Raimon (CPHT) left patient a voicemail in regards to Kimorah's delivery date. Maeson called our pharmacy and stated that she is ok with receiving her Humira  on Friday 01/15/25.

## 2025-01-14 ENCOUNTER — Other Ambulatory Visit: Payer: Self-pay

## 2025-01-19 ENCOUNTER — Ambulatory Visit (HOSPITAL_BASED_OUTPATIENT_CLINIC_OR_DEPARTMENT_OTHER): Admitting: Physical Therapy

## 2025-01-22 ENCOUNTER — Other Ambulatory Visit (HOSPITAL_COMMUNITY): Payer: Self-pay

## 2025-01-26 ENCOUNTER — Encounter (HOSPITAL_BASED_OUTPATIENT_CLINIC_OR_DEPARTMENT_OTHER): Payer: Self-pay | Admitting: Physical Therapy

## 2025-01-26 ENCOUNTER — Ambulatory Visit (HOSPITAL_BASED_OUTPATIENT_CLINIC_OR_DEPARTMENT_OTHER): Attending: Family Medicine | Admitting: Physical Therapy

## 2025-01-26 DIAGNOSIS — M6281 Muscle weakness (generalized): Secondary | ICD-10-CM

## 2025-01-26 DIAGNOSIS — R262 Difficulty in walking, not elsewhere classified: Secondary | ICD-10-CM

## 2025-01-26 DIAGNOSIS — M79662 Pain in left lower leg: Secondary | ICD-10-CM

## 2025-01-26 DIAGNOSIS — R29898 Other symptoms and signs involving the musculoskeletal system: Secondary | ICD-10-CM

## 2025-02-02 ENCOUNTER — Ambulatory Visit (HOSPITAL_BASED_OUTPATIENT_CLINIC_OR_DEPARTMENT_OTHER)

## 2025-02-02 ENCOUNTER — Ambulatory Visit (HOSPITAL_BASED_OUTPATIENT_CLINIC_OR_DEPARTMENT_OTHER): Admitting: Physical Therapy

## 2025-02-09 ENCOUNTER — Encounter (HOSPITAL_BASED_OUTPATIENT_CLINIC_OR_DEPARTMENT_OTHER): Admitting: Family Medicine

## 2025-10-19 ENCOUNTER — Ambulatory Visit: Admitting: Dermatology
